# Patient Record
Sex: Female | Born: 1935 | Race: White | Hispanic: No | Marital: Married | Smoking: Former smoker
Health system: Southern US, Community
[De-identification: ages and names within clinical notes are randomized; demographics above are authoritative.]

## PROBLEM LIST (undated history)

## (undated) DIAGNOSIS — Z973 Presence of spectacles and contact lenses: Secondary | ICD-10-CM

## (undated) DIAGNOSIS — M8580 Other specified disorders of bone density and structure, unspecified site: Secondary | ICD-10-CM

## (undated) DIAGNOSIS — D509 Iron deficiency anemia, unspecified: Secondary | ICD-10-CM

## (undated) DIAGNOSIS — C539 Malignant neoplasm of cervix uteri, unspecified: Secondary | ICD-10-CM

## (undated) DIAGNOSIS — R399 Unspecified symptoms and signs involving the genitourinary system: Secondary | ICD-10-CM

## (undated) DIAGNOSIS — Z8781 Personal history of (healed) traumatic fracture: Secondary | ICD-10-CM

## (undated) DIAGNOSIS — Z8551 Personal history of malignant neoplasm of bladder: Secondary | ICD-10-CM

## (undated) DIAGNOSIS — I10 Essential (primary) hypertension: Secondary | ICD-10-CM

## (undated) HISTORY — DX: Essential (primary) hypertension: I10

---

## 2004-07-01 HISTORY — PX: OTHER SURGICAL HISTORY: SHX169

## 2004-12-26 ENCOUNTER — Ambulatory Visit (HOSPITAL_COMMUNITY): Admission: RE | Admit: 2004-12-26 | Discharge: 2004-12-26 | Payer: Self-pay | Admitting: Orthopedic Surgery

## 2014-07-01 HISTORY — PX: COLONOSCOPY: SHX174

## 2014-12-27 ENCOUNTER — Inpatient Hospital Stay (HOSPITAL_COMMUNITY)
Admission: EM | Admit: 2014-12-27 | Discharge: 2014-12-30 | DRG: 470 | Disposition: A | Discharge status: Home Health | Payer: Commercial Managed Care - HMO | Attending: Internal Medicine | Admitting: Internal Medicine

## 2014-12-27 ENCOUNTER — Emergency Department (HOSPITAL_COMMUNITY): Payer: Commercial Managed Care - HMO

## 2014-12-27 ENCOUNTER — Encounter (HOSPITAL_COMMUNITY): Payer: Self-pay

## 2014-12-27 DIAGNOSIS — S72012A Unspecified intracapsular fracture of left femur, initial encounter for closed fracture: Secondary | ICD-10-CM | POA: Diagnosis present

## 2014-12-27 DIAGNOSIS — Z96642 Presence of left artificial hip joint: Secondary | ICD-10-CM | POA: Diagnosis not present

## 2014-12-27 DIAGNOSIS — R030 Elevated blood-pressure reading, without diagnosis of hypertension: Secondary | ICD-10-CM

## 2014-12-27 DIAGNOSIS — S72002S Fracture of unspecified part of neck of left femur, sequela: Secondary | ICD-10-CM | POA: Diagnosis not present

## 2014-12-27 DIAGNOSIS — R531 Weakness: Secondary | ICD-10-CM | POA: Diagnosis not present

## 2014-12-27 DIAGNOSIS — R404 Transient alteration of awareness: Secondary | ICD-10-CM | POA: Diagnosis not present

## 2014-12-27 DIAGNOSIS — Y93K1 Activity, walking an animal: Secondary | ICD-10-CM

## 2014-12-27 DIAGNOSIS — D72829 Elevated white blood cell count, unspecified: Secondary | ICD-10-CM | POA: Insufficient documentation

## 2014-12-27 DIAGNOSIS — S72002A Fracture of unspecified part of neck of left femur, initial encounter for closed fracture: Secondary | ICD-10-CM | POA: Diagnosis not present

## 2014-12-27 DIAGNOSIS — W1839XA Other fall on same level, initial encounter: Secondary | ICD-10-CM | POA: Diagnosis not present

## 2014-12-27 DIAGNOSIS — D62 Acute posthemorrhagic anemia: Secondary | ICD-10-CM | POA: Diagnosis not present

## 2014-12-27 DIAGNOSIS — R52 Pain, unspecified: Secondary | ICD-10-CM | POA: Diagnosis present

## 2014-12-27 DIAGNOSIS — Z471 Aftercare following joint replacement surgery: Secondary | ICD-10-CM | POA: Diagnosis not present

## 2014-12-27 DIAGNOSIS — Z882 Allergy status to sulfonamides status: Secondary | ICD-10-CM | POA: Diagnosis not present

## 2014-12-27 DIAGNOSIS — Z87891 Personal history of nicotine dependence: Secondary | ICD-10-CM | POA: Diagnosis not present

## 2014-12-27 DIAGNOSIS — Z419 Encounter for procedure for purposes other than remedying health state, unspecified: Secondary | ICD-10-CM

## 2014-12-27 DIAGNOSIS — S72002D Fracture of unspecified part of neck of left femur, subsequent encounter for closed fracture with routine healing: Secondary | ICD-10-CM | POA: Diagnosis not present

## 2014-12-27 DIAGNOSIS — E876 Hypokalemia: Secondary | ICD-10-CM | POA: Diagnosis not present

## 2014-12-27 DIAGNOSIS — S72042A Displaced fracture of base of neck of left femur, initial encounter for closed fracture: Secondary | ICD-10-CM | POA: Diagnosis not present

## 2014-12-27 DIAGNOSIS — IMO0001 Reserved for inherently not codable concepts without codable children: Secondary | ICD-10-CM | POA: Insufficient documentation

## 2014-12-27 LAB — CBC WITH DIFFERENTIAL/PLATELET
Basophils Absolute: 0 10*3/uL (ref 0.0–0.1)
Basophils Absolute: 0 10*3/uL (ref 0.0–0.1)
Basophils Relative: 0 % (ref 0–1)
Basophils Relative: 0 % (ref 0–1)
EOS ABS: 0 10*3/uL (ref 0.0–0.7)
Eosinophils Absolute: 0 10*3/uL (ref 0.0–0.7)
Eosinophils Relative: 0 % (ref 0–5)
Eosinophils Relative: 0 % (ref 0–5)
HCT: 43.7 % (ref 36.0–46.0)
HEMATOCRIT: 40.1 % (ref 36.0–46.0)
Hemoglobin: 14.6 g/dL (ref 12.0–15.0)
Hemoglobin: 13.8 g/dL (ref 12.0–15.0)
LYMPHS ABS: 0.8 10*3/uL (ref 0.7–4.0)
LYMPHS PCT: 5 % — AB (ref 12–46)
Lymphocytes Relative: 5 % — ABNORMAL LOW (ref 12–46)
Lymphs Abs: 0.9 10*3/uL (ref 0.7–4.0)
MCH: 29.4 pg (ref 26.0–34.0)
MCH: 30 pg (ref 26.0–34.0)
MCHC: 33.4 g/dL (ref 30.0–36.0)
MCHC: 34.4 g/dL (ref 30.0–36.0)
MCV: 87.9 fL (ref 78.0–100.0)
MCV: 87.2 fL (ref 78.0–100.0)
MONOS PCT: 4 % (ref 3–12)
Monocytes Absolute: 0.7 10*3/uL (ref 0.1–1.0)
Monocytes Absolute: 0.7 10*3/uL (ref 0.1–1.0)
Monocytes Relative: 4 % (ref 3–12)
Neutro Abs: 15.6 10*3/uL — ABNORMAL HIGH (ref 1.7–7.7)
Neutro Abs: 15.2 10*3/uL — ABNORMAL HIGH (ref 1.7–7.7)
Neutrophils Relative %: 91 % — ABNORMAL HIGH (ref 43–77)
Neutrophils Relative %: 91 % — ABNORMAL HIGH (ref 43–77)
Platelets: 259 10*3/uL (ref 150–400)
Platelets: 243 10*3/uL (ref 150–400)
RBC: 4.97 MIL/uL (ref 3.87–5.11)
RBC: 4.6 MIL/uL (ref 3.87–5.11)
RDW: 13.1 % (ref 11.5–15.5)
RDW: 12.8 % (ref 11.5–15.5)
WBC: 17.2 10*3/uL — ABNORMAL HIGH (ref 4.0–10.5)
WBC: 16.6 10*3/uL — AB (ref 4.0–10.5)

## 2014-12-27 LAB — TYPE AND SCREEN
ABO/RH(D): A NEG
Antibody Screen: NEGATIVE

## 2014-12-27 LAB — BASIC METABOLIC PANEL
ANION GAP: 10 (ref 5–15)
BUN: 19 mg/dL (ref 6–20)
CO2: 26 mmol/L (ref 22–32)
Calcium: 9.3 mg/dL (ref 8.9–10.3)
Chloride: 103 mmol/L (ref 101–111)
Creatinine, Ser: 0.74 mg/dL (ref 0.44–1.00)
GFR calc Af Amer: >60 mL/min (ref >60)
GLUCOSE: 115 mg/dL — AB (ref 65–99)
POTASSIUM: 3.9 mmol/L (ref 3.5–5.1)
Sodium: 139 mmol/L (ref 135–145)

## 2014-12-27 LAB — PROTIME-INR
INR: 0.99 (ref 0.00–1.49)
PROTHROMBIN TIME: 13.3 s (ref 11.6–15.2)

## 2014-12-27 LAB — ABO/RH: ABO/RH(D): A NEG

## 2014-12-27 MED ORDER — MORPHINE SULFATE 2 MG/ML IJ SOLN
2.0000 mg | Freq: Once | INTRAMUSCULAR | Status: AC
  Administered 2014-12-27: 2 mg via INTRAVENOUS
  Filled 2014-12-27: qty 1

## 2014-12-27 NOTE — Consult Note (Signed)
Reason for Consult:  Left hip fracture Referring Physician:  EDP  Sherri Mendez is an 79 y.o. female.  HPI:   79 yo female who sustained and accidental mechanical fall when pulled down by the neighbors dog who she was trying to walk.  She felt severe left hip pain following this fall (10 out of 10 pain) and was unable to ambulate.  She was brought to [facility] ED via EMS and found to have a left hip fracture.  She reports only left hip pain and denies other injuries.  She also denies any syncopal episode and denies CP/SOB/F/C/N/V.  History reviewed. No pertinent past medical history.  History reviewed. No pertinent past surgical history.  History reviewed. No pertinent family history.  Social History:  reports that she has quit smoking. She does not have any smokeless tobacco history on file. Her alcohol and drug histories are not on file.  Allergies:  Allergies  Allergen Reactions  . Sulfa Antibiotics Hives    Medications: I have reviewed the patient's current medications.  Results for orders placed or performed during the hospital encounter of 12/27/14 (from the past 48 hour(s))  CBC WITH DIFFERENTIAL     Status: Abnormal   Collection Time: 12/27/14  8:41 PM  Result Value Ref Range   WBC 17.2 (H) 4.0 - 10.5 K/uL   RBC 4.97 3.87 - 5.11 MIL/uL   Hemoglobin 14.6 12.0 - 15.0 g/dL   HCT 43.7 36.0 - 46.0 %   MCV 87.9 78.0 - 100.0 fL   MCH 29.4 26.0 - 34.0 pg   MCHC 33.4 30.0 - 36.0 g/dL   RDW 13.1 11.5 - 15.5 %   Platelets 259 150 - 400 K/uL   Neutrophils Relative % 91 (H) 43 - 77 %   Neutro Abs 15.6 (H) 1.7 - 7.7 K/uL   Lymphocytes Relative 5 (L) 12 - 46 %   Lymphs Abs 0.9 0.7 - 4.0 K/uL   Monocytes Relative 4 3 - 12 %   Monocytes Absolute 0.7 0.1 - 1.0 K/uL   Eosinophils Relative 0 0 - 5 %   Eosinophils Absolute 0.0 0.0 - 0.7 K/uL   Basophils Relative 0 0 - 1 %   Basophils Absolute 0.0 0.0 - 0.1 K/uL    Dg Hip Unilat With Pelvis 2-3 Views Left  12/27/2014   CLINICAL DATA:   Left femur pain after fall.  EXAM: LEFT HIP (WITH PELVIS) 2-3 VIEWS  COMPARISON:  None.  FINDINGS: Malalignment of the femoral neck and head is highly suspicious for a subcapital femoral neck fracture.  Degenerative arthropathy of both hips, right greater than left.  Scattered vascular calcifications in the anatomic pelvis.  IMPRESSION: 1. Subcapital left femoral neck fracture 2. Degenerative arthropathy of both hips, right greater than left.   Electronically Signed   By: Van Clines M.D.   On: 12/27/2014 19:58   Dg Femur Min 2 Views Left  12/27/2014   CLINICAL DATA:  Pain following fall wall walking dog  EXAM: LEFT FEMUR 2 VIEWS  COMPARISON:  None.  FINDINGS: Frontal and lateral views were obtained. There is an impacted subcapital femoral neck fracture. No other fracture. No dislocation. There is moderate narrowing in the hip and knee joint regions. No knee joint effusion.  IMPRESSION: Evidence of an impacted subcapital femoral neck fracture. No dislocation. Osteoarthritic change in the knee and hip joints.   Electronically Signed   By: Lowella Grip III M.D.   On: 12/27/2014 19:59  Review of Systems  All other systems reviewed and are negative.  Blood pressure 197/88, pulse 97, temperature 98.5 F (36.9 C), temperature source Oral, resp. rate 20, height '5\' 5"'$  (1.651 m), weight 70.308 kg (155 lb), SpO2 94 %. Physical Exam  Constitutional: She is oriented to person, place, and time. She appears well-developed and well-nourished.  HENT:  Head: Normocephalic and atraumatic.  Eyes: EOM are normal. Pupils are equal, round, and reactive to light.  Neck: Normal range of motion. Neck supple.  Cardiovascular: Normal rate and regular rhythm.   Respiratory: Effort normal and breath sounds normal.  GI: Soft. Bowel sounds are normal.  Musculoskeletal:       Left hip: She exhibits decreased range of motion, decreased strength, tenderness and bony tenderness.  Neurological: She is alert and  oriented to person, place, and time.  Skin: Skin is warm and dry.  Psychiatric: She has a normal mood and affect.   She has severe pain with any attempts of motion of her left hip. Her left leg is only slightly shortened. Her right foot is well-perfused with normal motor and sensory function   Assessment/Plan: Left hip femoral neck fracture 1)  I spoke to Ms. Simones in length and great detail about her left hip injury.  My recommendation would be a left total hip arthroplasty thru an anterior approach given the impaction and displacement of this fracture.  She is a very mobile person and quite active with no significant medical issues.  She will be able to mobilize much more quickly with a hip replacement.  She understands fully the risks of acute blood loss, nerve and vessel injury, infection, DVT, and fracture.  She also recognizes that the goals of surgery are decreased pain, improved mobility, and improved quality of life.  She will be set up for surgery tomorrow early afternoon at [facility] due to OR availability.  This surgery will more likely be performed by my partner Dr. Eduard Roux, and she understands this fully as well.  She can eat now, and should be NPO after midnight tonight.  Will likely only use aspirin as DVT proph. Following surgery.  Mcarthur Rossetti 12/27/2014, 9:09 PM

## 2014-12-27 NOTE — ED Provider Notes (Signed)
CSN: [id number]     Arrival date & time 12/27/14  1752 History   First MD Initiated Contact with Patient 12/27/14 1757     Chief Complaint  Patient presents with  . Fall    Fall approx 32mn ago while walking dog - c/o left leg pain.   HPI  89year old female presents today status post fall. Patient reports she was walking the dog doctor cough causing her to fall forward and landing on her left side. She reports minor abrasions to right elbow no pain to the elbow. She reports left lateral leg pain. She reports at that time she was unable to ambulate, had to call 911 to bring her to the hospital. Further questioning patient reports she was able to stand but had significant difficulties with ambulation. She denies loss of distal sensation, perfusion of the extremity. Patient reports difficulty with hip flexion due to pain in the leg. She reports she is not currently on any antiplatelets or blood thinners. Is not tried anything for the pain. Denies any other injuries.   History reviewed. No pertinent past medical history. History reviewed. No pertinent past surgical history. History reviewed. No pertinent family history. History  Substance Use Topics  . Smoking status: Former SResearch scientist (life sciences) . Smokeless tobacco: Not on file  . Alcohol Use: Not on file   OB History    No data available     Review of Systems  All other systems reviewed and are negative.   Allergies  Sulfa antibiotics  Home Medications   Prior to Admission medications   Medication Sig Start Date End Date Taking? Authorizing Provider  Multiple Vitamins-Minerals (MULTIVITAMIN WITH MINERALS) tablet Take 1 tablet by mouth daily.   Yes Historical Provider, MD   BP 152/59 mmHg  Pulse 76  Temp(Src) 98.8 F (37.1 C) (Oral)  Resp 18  Ht '5\' 5"'$  (1.651 m)  Wt 155 lb (70.308 kg)  BMI 25.79 kg/m2  SpO2 93%    Physical Exam  Constitutional: She is oriented to person, place, and time. She appears well-developed and  well-nourished.  HENT:  Head: Normocephalic and atraumatic.  Eyes: Conjunctivae are normal. Pupils are equal, round, and reactive to light. Right eye exhibits no discharge. Left eye exhibits no discharge. No scleral icterus.  Neck: Normal range of motion. No JVD present. No tracheal deviation present.  Pulmonary/Chest: Effort normal. No stridor.  Musculoskeletal:  Abrasions to the left elbow, nontender to palpation, full flexion and extension pronation supination. Strength 5 out of 5 and sensation grossly intact in the extremity.. Left lower leg tender to palpation in the left lateral aspect. Hips stable, no pain with palpation or movement. Knee nontender to palpation, no signs of distal injuries. Sensation perfusion intact. Right leg raise difficult due to pain of the left thigh.  Neurological: She is alert and oriented to person, place, and time. Coordination normal.  Psychiatric: She has a normal mood and affect. Her behavior is normal. Judgment and thought content normal.  Nursing note and vitals reviewed.   ED Course  Procedures (including critical care time) Labs Review Labs Reviewed  CBC WITH DIFFERENTIAL/PLATELET - Abnormal; Notable for the following:    WBC 17.2 (*)    Neutrophils Relative % 91 (*)    Neutro Abs 15.6 (*)    Lymphocytes Relative 5 (*)    All other components within normal limits  BASIC METABOLIC PANEL - Abnormal; Notable for the following:    Glucose, Bld 115 (*)    All other  components within normal limits  CBC WITH DIFFERENTIAL/PLATELET - Abnormal; Notable for the following:    WBC 16.6 (*)    Neutrophils Relative % 91 (*)    Neutro Abs 15.2 (*)    Lymphocytes Relative 5 (*)    All other components within normal limits  PROTIME-INR  TYPE AND SCREEN  ABO/RH    Imaging Review Dg Hip Unilat With Pelvis 2-3 Views Left  12/27/2014   CLINICAL DATA:  Left femur pain after fall.  EXAM: LEFT HIP (WITH PELVIS) 2-3 VIEWS  COMPARISON:  None.  FINDINGS:  Malalignment of the femoral neck and head is highly suspicious for a subcapital femoral neck fracture.  Degenerative arthropathy of both hips, right greater than left.  Scattered vascular calcifications in the anatomic pelvis.  IMPRESSION: 1. Subcapital left femoral neck fracture 2. Degenerative arthropathy of both hips, right greater than left.   Electronically Signed   By: Van Clines M.D.   On: 12/27/2014 19:58   Dg Femur Min 2 Views Left  12/27/2014   CLINICAL DATA:  Pain following fall wall walking dog  EXAM: LEFT FEMUR 2 VIEWS  COMPARISON:  None.  FINDINGS: Frontal and lateral views were obtained. There is an impacted subcapital femoral neck fracture. No other fracture. No dislocation. There is moderate narrowing in the hip and knee joint regions. No knee joint effusion.  IMPRESSION: Evidence of an impacted subcapital femoral neck fracture. No dislocation. Osteoarthritic change in the knee and hip joints.   Electronically Signed   By: Lowella Grip III M.D.   On: 12/27/2014 19:59     EKG Interpretation None      MDM   Final diagnoses:  Pain  Femoral neck fracture, left, closed, initial encounter   Labs: BMP, CBC, PT/INR, type and screen  Imaging: DG femur, DG hip unilateral- subcapital left femoral neck fracture  Consults: Orthopedics Dr. Ninfa Linden, hospitalist  Therapeutics: Morphine   Plan: Patient was personally seen and evaluated by Dr. Ninfa Linden urine the ED. He recommended admission at [facility] with surgery tomorrow. Basic labs are drawn, no significant findings, nothing by mouth at midnight. Hospitalist consult at and agreed to the plan. Patient was treated with morphine here in the ED with good symptom resolution.      Okey Regal, PA-C 12/28/14 [phone]

## 2014-12-27 NOTE — Progress Notes (Signed)
EDCM spoke to patient at bedside.  Patient listed as not having insurance or a pcp, however when [facility] spoke to patient, patient reports she has [employer].  Patient also reports she has a new pcp, can't remember her name at this time.  Patient lives at home alone.  Patient is usually able to complete her ADL's without difficulty.  Patient prepares her own meals or "I eat out."  Patient has a walker at home.  No further dme at home.  Patient has never had home health services.  Patient reports she is a widower and has a daughter Geanie Berlin who lives in [place].  No other family members.  Patient has a neighbor at bedside.  EDCM provided patient with list of home health agencies in [place] explained services.  Patient presents to Ed with fall sustaining subcapital left femoral neck fracture.  Patient may need rehab placement.

## 2014-12-27 NOTE — ED Notes (Signed)
Pt transported by [facility] for fall.  Pt reports she was walking her dog and he pulled her down when running.  Pt landed on left side and has c/o left leg pain.  No deformity - patient unable to bear weight.  Denies other injuries.

## 2014-12-27 NOTE — ED Provider Notes (Signed)
Medical screening examination/treatment/procedure(s) were conducted as a shared visit with non-physician practitioner(s) and myself.  I personally evaluated the patient during the encounter.   EKG Interpretation None     Patient here after mechanical fall prior to arrival and complains of pain to her left hip. Physical exam shows a shortened and rotated left lower extremity and x-ray shows a fracture. Will consult orthopedics and admitted to medicine  Lacretia Leigh, MD 12/27/14 2011

## 2014-12-27 NOTE — Clinical Social Work Note (Signed)
Clinical Social Work Assessment  Patient Details  Name: Sherri Mendez MRN: [id number] Date of Birth: 04-24-1936  Date of referral:  12/27/14               Reason for consult:   (Fall.)                Permission sought to share information with:   (None.) Permission granted to share information::  No  Name::        Agency::     Relationship::     Contact Information:     Housing/Transportation Living arrangements for the past 2 months:  Single Family Home Source of Information:  Patient Patient Interpreter Needed:  None Criminal Activity/Legal Involvement Pertinent to Current Situation/Hospitalization:  No - Comment as needed Significant Relationships:  Adult Children (Patient informed CSW that her daughter [name] who lives in [place] is a great support .) Lives with:  Self Do you feel safe going back to the place where you live?  Yes ([place] was informed by physician that she broke her hip. Patient states if rehab is needed she would like to be placed at facility. ) Need for family participation in patient care:  Yes (Comment)  Care giving concerns:  Patient informed CSW that she lives home alone. Patient expressed to CSW that if rehab is needed she would like to be placed at a facility.   Social Worker assessment / plan:  CSW met with patient at bedside. Neighbor was present. Patient confirms that she presents to [facility] due to a fall. Patient confirms that she fell while walking a dog. Patient stated " She took after something and just pulled me along with her and I fell." Patient states that she does not fall often. Patient says that prior to coming to [facility] she has been able to complete her ADL's independently.  Patient informed CSW that her daughter is her primary support and that she lives in [place]. Patient also says that she has a great support system that consist of her neighbors.  Employment status:  Retired Forensic scientist:   Actor.) PT  Recommendations:  Not assessed at this time Information / Referral to community resources:   (CSW will give patient a list of SNF.)  Patient/Family's Response to care:  Patient's response to care is appropriate at this time. She is aware that she will be admitted.  Patient/Family's Understanding of and Emotional Response to Diagnosis, Current Treatment, and Prognosis:  Patient is understanding of her diagnosis at this time.  Emotional Assessment Appearance:  Appears stated age Attitude/Demeanor/Rapport:   (Well Mannered.) Affect (typically observed):  Accepting, Appropriate Orientation:  Oriented to Self, Oriented to Place, Oriented to  Time, Oriented to Situation Alcohol / Substance use:   (Patient informed CSW that she drinks wine daily.) Psych involvement (Current and /or in the community):  No (Comment)  Discharge Needs  Concerns to be addressed:  Adjustment to Illness Readmission within the last 30 days:  No Current discharge risk:  None Barriers to Discharge:  No Barriers Identified   Bernita Buffy, LCSW 12/27/2014, 10:34 PM

## 2014-12-27 NOTE — ED Notes (Signed)
Admitting physician with pt at this time

## 2014-12-27 NOTE — ED Notes (Signed)
Bed: WA03 Expected date:  Expected time:  Means of arrival:  Comments: EMS_fall 

## 2014-12-27 NOTE — ED Notes (Signed)
Patient transported to X-ray 

## 2014-12-28 ENCOUNTER — Inpatient Hospital Stay (HOSPITAL_COMMUNITY): Payer: Commercial Managed Care - HMO | Admitting: Anesthesiology

## 2014-12-28 ENCOUNTER — Inpatient Hospital Stay (HOSPITAL_COMMUNITY): Payer: Commercial Managed Care - HMO

## 2014-12-28 ENCOUNTER — Encounter (HOSPITAL_COMMUNITY)
Admission: EM | Disposition: A | Discharge status: Home Health | Payer: Self-pay | Source: Home / Self Care | Attending: Internal Medicine

## 2014-12-28 ENCOUNTER — Encounter (HOSPITAL_COMMUNITY): Payer: Self-pay | Admitting: Anesthesiology

## 2014-12-28 DIAGNOSIS — D62 Acute posthemorrhagic anemia: Secondary | ICD-10-CM | POA: Diagnosis not present

## 2014-12-28 DIAGNOSIS — D72829 Elevated white blood cell count, unspecified: Secondary | ICD-10-CM | POA: Insufficient documentation

## 2014-12-28 DIAGNOSIS — R030 Elevated blood-pressure reading, without diagnosis of hypertension: Secondary | ICD-10-CM

## 2014-12-28 DIAGNOSIS — Z882 Allergy status to sulfonamides status: Secondary | ICD-10-CM | POA: Diagnosis not present

## 2014-12-28 DIAGNOSIS — W1839XA Other fall on same level, initial encounter: Secondary | ICD-10-CM | POA: Diagnosis not present

## 2014-12-28 DIAGNOSIS — S72002S Fracture of unspecified part of neck of left femur, sequela: Secondary | ICD-10-CM | POA: Diagnosis not present

## 2014-12-28 DIAGNOSIS — Y93K1 Activity, walking an animal: Secondary | ICD-10-CM | POA: Diagnosis not present

## 2014-12-28 DIAGNOSIS — S72012A Unspecified intracapsular fracture of left femur, initial encounter for closed fracture: Secondary | ICD-10-CM | POA: Diagnosis present

## 2014-12-28 DIAGNOSIS — R52 Pain, unspecified: Secondary | ICD-10-CM | POA: Diagnosis present

## 2014-12-28 DIAGNOSIS — S72002D Fracture of unspecified part of neck of left femur, subsequent encounter for closed fracture with routine healing: Secondary | ICD-10-CM | POA: Diagnosis not present

## 2014-12-28 DIAGNOSIS — E876 Hypokalemia: Secondary | ICD-10-CM | POA: Diagnosis not present

## 2014-12-28 DIAGNOSIS — IMO0001 Reserved for inherently not codable concepts without codable children: Secondary | ICD-10-CM | POA: Insufficient documentation

## 2014-12-28 DIAGNOSIS — S72002A Fracture of unspecified part of neck of left femur, initial encounter for closed fracture: Secondary | ICD-10-CM | POA: Diagnosis not present

## 2014-12-28 DIAGNOSIS — Z87891 Personal history of nicotine dependence: Secondary | ICD-10-CM | POA: Diagnosis not present

## 2014-12-28 HISTORY — PX: TOTAL HIP ARTHROPLASTY: SHX124

## 2014-12-28 LAB — CREATININE, SERUM
Creatinine, Ser: 0.76 mg/dL (ref 0.44–1.00)
GFR calc Af Amer: >60 mL/min (ref >60)
GFR calc non Af Amer: >60 mL/min (ref >60)

## 2014-12-28 LAB — CBC
HEMATOCRIT: 34 % — AB (ref 36.0–46.0)
Hemoglobin: 11.5 g/dL — ABNORMAL LOW (ref 12.0–15.0)
MCH: 29.9 pg (ref 26.0–34.0)
MCHC: 33.8 g/dL (ref 30.0–36.0)
MCV: 88.3 fL (ref 78.0–100.0)
Platelets: 223 10*3/uL (ref 150–400)
RBC: 3.85 MIL/uL — AB (ref 3.87–5.11)
RDW: 13.2 % (ref 11.5–15.5)
WBC: 15.4 10*3/uL — ABNORMAL HIGH (ref 4.0–10.5)

## 2014-12-28 LAB — SURGICAL PCR SCREEN
MRSA, PCR: NEGATIVE
STAPHYLOCOCCUS AUREUS: NEGATIVE

## 2014-12-28 SURGERY — ARTHROPLASTY, HIP, TOTAL, ANTERIOR APPROACH
Anesthesia: Monitor Anesthesia Care | Laterality: Left

## 2014-12-28 MED ORDER — POVIDONE-IODINE 10 % EX SOLN
CUTANEOUS | Status: DC | PRN
  Administered 2014-12-28: 1 via TOPICAL

## 2014-12-28 MED ORDER — HYDROCODONE-ACETAMINOPHEN 5-325 MG PO TABS
1.0000 | ORAL_TABLET | Freq: Four times a day (QID) | ORAL | Status: DC | PRN
  Administered 2014-12-28 – 2014-12-29 (×2): 1 via ORAL
  Filled 2014-12-28 (×2): qty 1

## 2014-12-28 MED ORDER — PHENYLEPHRINE HCL 10 MG/ML IJ SOLN
10.0000 mg | INTRAVENOUS | Status: DC | PRN
  Administered 2014-12-28: 40 ug/min via INTRAVENOUS

## 2014-12-28 MED ORDER — ONDANSETRON HCL 4 MG/2ML IJ SOLN
4.0000 mg | Freq: Four times a day (QID) | INTRAMUSCULAR | Status: DC | PRN
  Administered 2014-12-28: 4 mg via INTRAVENOUS

## 2014-12-28 MED ORDER — ASPIRIN EC 325 MG PO TBEC: 325.0000 mg | DELAYED_RELEASE_TABLET | Freq: Every day | ORAL | Status: DC

## 2014-12-28 MED ORDER — ACETAMINOPHEN 325 MG PO TABS
ORAL_TABLET | ORAL | Status: AC
  Filled 2014-12-28: qty 2

## 2014-12-28 MED ORDER — SODIUM CHLORIDE 0.9 % IV SOLN
INTRAVENOUS | Status: DC
  Administered 2014-12-28: 19:00:00 via INTRAVENOUS

## 2014-12-28 MED ORDER — MIDAZOLAM HCL 2 MG/2ML IJ SOLN
INTRAMUSCULAR | Status: AC
  Filled 2014-12-28: qty 2

## 2014-12-28 MED ORDER — ONDANSETRON HCL 4 MG PO TABS: 4.0000 mg | ORAL_TABLET | Freq: Four times a day (QID) | ORAL | Status: DC | PRN

## 2014-12-28 MED ORDER — ONDANSETRON HCL 4 MG/2ML IJ SOLN
INTRAMUSCULAR | Status: AC
  Filled 2014-12-28: qty 2

## 2014-12-28 MED ORDER — ENOXAPARIN SODIUM 40 MG/0.4ML ~~LOC~~ SOLN: 40.0000 mg | Freq: Every day | SUBCUTANEOUS | Status: DC

## 2014-12-28 MED ORDER — ACETAMINOPHEN 325 MG PO TABS
650.0000 mg | ORAL_TABLET | Freq: Four times a day (QID) | ORAL | Status: DC | PRN
  Administered 2014-12-28: 650 mg via ORAL

## 2014-12-28 MED ORDER — FENTANYL CITRATE (PF) 100 MCG/2ML IJ SOLN
INTRAMUSCULAR | Status: DC | PRN
  Administered 2014-12-28: 25 ug via INTRAVENOUS
  Administered 2014-12-28: 50 ug via INTRAVENOUS
  Administered 2014-12-28: 25 ug via INTRAVENOUS

## 2014-12-28 MED ORDER — OXYCODONE HCL 5 MG PO TABS
ORAL_TABLET | ORAL | Status: AC
  Filled 2014-12-28: qty 2

## 2014-12-28 MED ORDER — MORPHINE SULFATE 2 MG/ML IJ SOLN
0.5000 mg | INTRAMUSCULAR | Status: DC | PRN
  Administered 2014-12-28 – 2014-12-29 (×3): 0.5 mg via INTRAVENOUS
  Filled 2014-12-28 (×5): qty 1

## 2014-12-28 MED ORDER — OXYCODONE HCL 5 MG PO TABS: 5.0000 mg | ORAL_TABLET | ORAL | Status: DC | PRN

## 2014-12-28 MED ORDER — ENOXAPARIN SODIUM 40 MG/0.4ML ~~LOC~~ SOLN
40.0000 mg | SUBCUTANEOUS | Status: DC
  Administered 2014-12-29 – 2014-12-30 (×2): 40 mg via SUBCUTANEOUS
  Filled 2014-12-28 (×2): qty 0.4

## 2014-12-28 MED ORDER — LACTATED RINGERS IV SOLN
INTRAVENOUS | Status: DC
  Administered 2014-12-28: 50 mL/h via INTRAVENOUS
  Administered 2014-12-28: 15:00:00 via INTRAVENOUS

## 2014-12-28 MED ORDER — ALUM & MAG HYDROXIDE-SIMETH 200-200-20 MG/5ML PO SUSP: 30.0000 mL | ORAL | Status: DC | PRN

## 2014-12-28 MED ORDER — PROPOFOL 10 MG/ML IV BOLUS
INTRAVENOUS | Status: DC | PRN
  Administered 2014-12-28: 30 mg via INTRAVENOUS
  Administered 2014-12-28: 20 mg via INTRAVENOUS

## 2014-12-28 MED ORDER — MIDAZOLAM HCL 5 MG/5ML IJ SOLN
INTRAMUSCULAR | Status: DC | PRN
  Administered 2014-12-28 (×2): 0.5 mg via INTRAVENOUS

## 2014-12-28 MED ORDER — OXYCODONE HCL 5 MG PO TABS
5.0000 mg | ORAL_TABLET | ORAL | Status: DC | PRN
  Administered 2014-12-28: 10 mg via ORAL
  Administered 2014-12-29 – 2014-12-30 (×3): 5 mg via ORAL
  Filled 2014-12-28: qty 2
  Filled 2014-12-28 (×2): qty 1

## 2014-12-28 MED ORDER — PROPOFOL INFUSION 10 MG/ML OPTIME
INTRAVENOUS | Status: DC | PRN
  Administered 2014-12-28: 25 ug/kg/min via INTRAVENOUS

## 2014-12-28 MED ORDER — METHOCARBAMOL 1000 MG/10ML IJ SOLN
500.0000 mg | Freq: Four times a day (QID) | INTRAVENOUS | Status: DC | PRN
  Filled 2014-12-28: qty 5

## 2014-12-28 MED ORDER — MORPHINE SULFATE 2 MG/ML IJ SOLN: 0.5000 mg | INTRAMUSCULAR | Status: DC | PRN

## 2014-12-28 MED ORDER — POLYETHYLENE GLYCOL 3350 17 G PO PACK: 17.0000 g | PACK | Freq: Every day | ORAL | Status: DC | PRN

## 2014-12-28 MED ORDER — 0.9 % SODIUM CHLORIDE (POUR BTL) OPTIME
TOPICAL | Status: DC | PRN
  Administered 2014-12-28: 1000 mL

## 2014-12-28 MED ORDER — BUPIVACAINE IN DEXTROSE 0.75-8.25 % IT SOLN
INTRATHECAL | Status: DC | PRN
  Administered 2014-12-28: 15 mg via INTRATHECAL

## 2014-12-28 MED ORDER — ACETAMINOPHEN 650 MG RE SUPP: 650.0000 mg | Freq: Four times a day (QID) | RECTAL | Status: DC | PRN

## 2014-12-28 MED ORDER — METOCLOPRAMIDE HCL 5 MG/ML IJ SOLN: 5.0000 mg | Freq: Three times a day (TID) | INTRAMUSCULAR | Status: DC | PRN

## 2014-12-28 MED ORDER — MENTHOL 3 MG MT LOZG: 1.0000 | LOZENGE | OROMUCOSAL | Status: DC | PRN

## 2014-12-28 MED ORDER — METHOCARBAMOL 500 MG PO TABS
ORAL_TABLET | ORAL | Status: AC
  Filled 2014-12-28: qty 1

## 2014-12-28 MED ORDER — CEFAZOLIN SODIUM-DEXTROSE 2-3 GM-% IV SOLR
INTRAVENOUS | Status: DC | PRN
  Administered 2014-12-28: 2 g via INTRAVENOUS

## 2014-12-28 MED ORDER — HYDROCODONE-ACETAMINOPHEN 5-325 MG PO TABS: 1.0000 | ORAL_TABLET | Freq: Four times a day (QID) | ORAL | Status: DC | PRN

## 2014-12-28 MED ORDER — CEFAZOLIN SODIUM-DEXTROSE 2-3 GM-% IV SOLR
2.0000 g | Freq: Four times a day (QID) | INTRAVENOUS | Status: AC
  Administered 2014-12-28 – 2014-12-29 (×3): 2 g via INTRAVENOUS
  Filled 2014-12-28 (×3): qty 50

## 2014-12-28 MED ORDER — PHENOL 1.4 % MT LIQD: 1.0000 | OROMUCOSAL | Status: DC | PRN

## 2014-12-28 MED ORDER — FENTANYL CITRATE (PF) 250 MCG/5ML IJ SOLN
INTRAMUSCULAR | Status: AC
  Filled 2014-12-28: qty 5

## 2014-12-28 MED ORDER — METHOCARBAMOL 500 MG PO TABS
500.0000 mg | ORAL_TABLET | Freq: Four times a day (QID) | ORAL | Status: DC | PRN
  Administered 2014-12-28 – 2014-12-30 (×2): 500 mg via ORAL
  Filled 2014-12-28: qty 1

## 2014-12-28 MED ORDER — METOCLOPRAMIDE HCL 5 MG PO TABS: 5.0000 mg | ORAL_TABLET | Freq: Three times a day (TID) | ORAL | Status: DC | PRN

## 2014-12-28 MED ORDER — SODIUM CHLORIDE 0.9 % IR SOLN
Status: DC | PRN
  Administered 2014-12-28: 3000 mL
  Administered 2014-12-28: 1000 mL

## 2014-12-28 SURGICAL SUPPLY — 53 items
BLADE SAW SGTL 18X1.27X75 (BLADE) ×2 IMPLANT
BLADE SAW SGTL 18X1.27X75MM (BLADE) ×1
BNDG COHESIVE 6X5 TAN STRL LF (GAUZE/BANDAGES/DRESSINGS) ×3 IMPLANT
CAPT HIP TOTAL 2 ×3 IMPLANT
CELLS DAT CNTRL 66122 CELL SVR (MISCELLANEOUS) ×1 IMPLANT
COVER SURGICAL LIGHT HANDLE (MISCELLANEOUS) ×3 IMPLANT
DRAPE C-ARM 42X72 X-RAY (DRAPES) ×3 IMPLANT
DRAPE IMP U-DRAPE 54X76 (DRAPES) ×3 IMPLANT
DRAPE STERI IOBAN 125X83 (DRAPES) ×3 IMPLANT
DRAPE U-SHAPE 47X51 STRL (DRAPES) ×9 IMPLANT
DRSG AQUACEL AG ADV 3.5X10 (GAUZE/BANDAGES/DRESSINGS) ×3 IMPLANT
DRSG MEPILEX BORDER 4X8 (GAUZE/BANDAGES/DRESSINGS) IMPLANT
DURAPREP 26ML APPLICATOR (WOUND CARE) ×3 IMPLANT
ELECT BLADE 4.0 EZ CLEAN MEGAD (MISCELLANEOUS) ×3
ELECT REM PT RETURN 9FT ADLT (ELECTROSURGICAL) ×3
ELECTRODE BLDE 4.0 EZ CLN MEGD (MISCELLANEOUS) ×1 IMPLANT
ELECTRODE REM PT RTRN 9FT ADLT (ELECTROSURGICAL) ×1 IMPLANT
FACESHIELD WRAPAROUND (MASK) ×3 IMPLANT
GLOVE BIOGEL PI IND STRL 6.5 (GLOVE) ×2 IMPLANT
GLOVE BIOGEL PI INDICATOR 6.5 (GLOVE) ×4
GLOVE ECLIPSE 6.5 STRL STRAW (GLOVE) ×3 IMPLANT
GLOVE NEODERM STRL 7.5 LF PF (GLOVE) ×2 IMPLANT
GLOVE SURG NEODERM 7.5  LF PF (GLOVE) ×4
GLOVE SURG SS PI 6.5 STRL IVOR (GLOVE) ×3 IMPLANT
GLOVE SURG SYN 7.5  E (GLOVE) ×2
GLOVE SURG SYN 7.5 E (GLOVE) ×1 IMPLANT
GOWN SRG XL XLNG 56XLVL 4 (GOWN DISPOSABLE) ×1 IMPLANT
GOWN STRL NON-REIN XL XLG LVL4 (GOWN DISPOSABLE) ×2
GOWN STRL REUS W/ TWL LRG LVL3 (GOWN DISPOSABLE) ×2 IMPLANT
GOWN STRL REUS W/TWL LRG LVL3 (GOWN DISPOSABLE) ×4
HANDPIECE INTERPULSE COAX TIP (DISPOSABLE) ×2
KIT BASIN OR (CUSTOM PROCEDURE TRAY) ×3 IMPLANT
MARKER SKIN DUAL TIP RULER LAB (MISCELLANEOUS) ×3 IMPLANT
PACK TOTAL JOINT (CUSTOM PROCEDURE TRAY) ×3 IMPLANT
PACK UNIVERSAL I (CUSTOM PROCEDURE TRAY) ×3 IMPLANT
PADDING CAST COTTON 6X4 STRL (CAST SUPPLIES) ×3 IMPLANT
RTRCTR WOUND ALEXIS 18CM MED (MISCELLANEOUS) ×3
SEALER BIPOLAR AQUA 6.0 (INSTRUMENTS) ×6 IMPLANT
SET HNDPC FAN SPRY TIP SCT (DISPOSABLE) ×1 IMPLANT
SOLUTION BETADINE 4OZ (MISCELLANEOUS) ×3 IMPLANT
STAPLER VISISTAT 35W (STAPLE) ×3 IMPLANT
SUT ETHIBOND 2 V 37 (SUTURE) ×3 IMPLANT
SUT ETHIBOND NAB CT1 #1 30IN (SUTURE) ×9 IMPLANT
SUT ETHILON 3 0 FSL (SUTURE) IMPLANT
SUT VIC AB 0 CT1 27 (SUTURE) ×2
SUT VIC AB 0 CT1 27XBRD ANBCTR (SUTURE) ×1 IMPLANT
SUT VIC AB 1 CT1 27 (SUTURE) ×2
SUT VIC AB 1 CT1 27XBRD ANBCTR (SUTURE) ×1 IMPLANT
SUT VIC AB 2-0 CT1 27 (SUTURE) ×4
SUT VIC AB 2-0 CT1 TAPERPNT 27 (SUTURE) ×2 IMPLANT
SYR 20CC LL (SYRINGE) ×3 IMPLANT
TOWEL OR 17X26 10 PK STRL BLUE (TOWEL DISPOSABLE) ×3 IMPLANT
TRAY FOLEY CATH 16FR SILVER (SET/KITS/TRAYS/PACK) IMPLANT

## 2014-12-28 NOTE — Transfer of Care (Signed)
Immediate Anesthesia Transfer of Care Note  Patient: Sherri Mendez  Procedure(s) Performed: Procedure(s): TOTAL HIP ARTHROPLASTY ANTERIOR APPROACH (Left)  Patient Location: PACU  Anesthesia Type:MAC and Spinal  Level of Consciousness: awake and oriented  Airway & Oxygen Therapy: Patient Spontanous Breathing and Patient connected to nasal cannula oxygen  Post-op Assessment: Report given to RN and Post -op Vital signs reviewed and stable  Post vital signs: Reviewed and stable  Last Vitals:  Filed Vitals:   12/28/14 1024  BP: 175/67  Pulse: 83  Temp: 37 C  Resp: 16    Complications: No apparent anesthesia complications

## 2014-12-28 NOTE — H&P (Signed)
[facility] History and Physical  EVEA SHEEK FGH:[id number] DOB: 1935-10-06 DOA: 12/27/2014  Referring physician: EDP PCP: No primary care provider on file.   Chief Complaint: Fall, hip pain   HPI: Sherri Mendez is a 79 y.o. female who suffered a mechanical fall at home after being pulled down by neighbors dog that she was trying to walk.  Severe left hip pain after fall, inability to ambulate.  Worse with movement.  Brought to [facility] and found to have L hip fracture.  No syncope, SOB, CP, etc either recently or with fall.  Has actually been very healthy at baseline and only takes a multivitamin.  Review of Systems: Systems reviewed.  As above, otherwise negative  History reviewed. No pertinent past medical history. History reviewed. No pertinent past surgical history. Social History:  reports that she has quit smoking. She does not have any smokeless tobacco history on file. Her alcohol and drug histories are not on file.  Allergies  Allergen Reactions  . Sulfa Antibiotics Hives    History reviewed. No pertinent family history.   Prior to Admission medications   Medication Sig Start Date End Date Taking? Authorizing Provider  Multiple Vitamins-Minerals (MULTIVITAMIN WITH MINERALS) tablet Take 1 tablet by mouth daily.   Yes Historical Provider, MD   Physical Exam: Filed Vitals:   12/27/14 2300  BP: 163/79  Pulse: 96  Temp:   Resp:     BP 163/79 mmHg  Pulse 96  Temp(Src) 98.5 F (36.9 C) (Oral)  Resp 20  Ht '5\' 5"'$  (1.651 m)  Wt 70.308 kg (155 lb)  BMI 25.79 kg/m2  SpO2 95%  General Appearance:    Alert, oriented, no distress, appears stated age  Head:    Normocephalic, atraumatic  Eyes:    PERRL, EOMI, sclera non-icteric        Nose:   Nares without drainage or epistaxis. Mucosa, turbinates normal  Throat:   Moist mucous membranes. Oropharynx without erythema or exudate.  Neck:   Supple. No carotid bruits.  No thyromegaly.  No lymphadenopathy.   Back:      No CVA tenderness, no spinal tenderness  Lungs:     Clear to auscultation bilaterally, without wheezes, rhonchi or rales  Chest wall:    No tenderness to palpitation  Heart:    Regular rate and rhythm without murmurs, gallops, rubs  Abdomen:     Soft, non-tender, nondistended, normal bowel sounds, no organomegaly  Genitalia:    deferred  Rectal:    deferred  Extremities:   No clubbing, cyanosis or edema.  Pulses:   2+ and symmetric all extremities  Skin:   Skin color, texture, turgor normal, no rashes or lesions  Lymph nodes:   Cervical, supraclavicular, and axillary nodes normal  Neurologic:   CNII-XII intact. Normal strength, sensation and reflexes      throughout    Labs on Admission:  Basic Metabolic Panel:  Recent Labs Lab 12/27/14 2119  NA 139  K 3.9  CL 103  CO2 26  GLUCOSE 115*  BUN 19  CREATININE 0.74  CALCIUM 9.3   Liver Function Tests: No results for input(s): AST, ALT, ALKPHOS, BILITOT, PROT, ALBUMIN in the last 168 hours. No results for input(s): LIPASE, AMYLASE in the last 168 hours. No results for input(s): AMMONIA in the last 168 hours. CBC:  Recent Labs Lab 12/27/14 2041 12/27/14 2119  WBC 17.2* 16.6*  NEUTROABS 15.6* 15.2*  HGB 14.6 13.8  HCT 43.7 40.1  MCV 87.9  87.2  PLT 259 243   Cardiac Enzymes: No results for input(s): CKTOTAL, CKMB, CKMBINDEX, TROPONINI in the last 168 hours.  BNP (last 3 results) No results for input(s): PROBNP in the last 8760 hours. CBG: No results for input(s): GLUCAP in the last 168 hours.  Radiological Exams on Admission: Dg Hip Unilat With Pelvis 2-3 Views Left  12/27/2014   CLINICAL DATA:  Left femur pain after fall.  EXAM: LEFT HIP (WITH PELVIS) 2-3 VIEWS  COMPARISON:  None.  FINDINGS: Malalignment of the femoral neck and head is highly suspicious for a subcapital femoral neck fracture.  Degenerative arthropathy of both hips, right greater than left.  Scattered vascular calcifications in the anatomic pelvis.   IMPRESSION: 1. Subcapital left femoral neck fracture 2. Degenerative arthropathy of both hips, right greater than left.   Electronically Signed   By: Van Clines M.D.   On: 12/27/2014 19:58   Dg Femur Min 2 Views Left  12/27/2014   CLINICAL DATA:  Pain following fall wall walking dog  EXAM: LEFT FEMUR 2 VIEWS  COMPARISON:  None.  FINDINGS: Frontal and lateral views were obtained. There is an impacted subcapital femoral neck fracture. No other fracture. No dislocation. There is moderate narrowing in the hip and knee joint regions. No knee joint effusion.  IMPRESSION: Evidence of an impacted subcapital femoral neck fracture. No dislocation. Osteoarthritic change in the knee and hip joints.   Electronically Signed   By: Lowella Grip III M.D.   On: 12/27/2014 19:59    EKG: Independently reviewed.  Assessment/Plan Principal Problem:   Fracture of femoral neck, left   1. L femoral neck fracture - 1. Ortho has seen patient and [facility] due [facility] 2. Ad[facility] 3. NPO after midnight 4. Hip fx pathway 5. OR tomorrow 6. ASA and SCDs for DVT ppx for now 7. No PMH (cardiopulmonary or otherwise), no recent CP, SOB, syncope, etc.  Seems very active and healthy for age at baseline (walks neighbors dog, on no meds for chronic diseases).    Code Status: Full Code  Family Communication: No family in room Disposition Plan: Admit to inpatient   Time spent: 70 min  GARDNER, JARED M. [facility] Pager [phone]  If 7AM-7PM, please contact the day team taking care of the patient [email or web address] Password [facility] 12/28/2014, 12:11 AM

## 2014-12-28 NOTE — Progress Notes (Signed)
Initial Nutrition Assessment  DOCUMENTATION CODES:  Not applicable  INTERVENTION:   (RD will follow for diet advancement, supplement diet as appropriate)  NUTRITION DIAGNOSIS:  Inadequate oral intake related to inability to eat as evidenced by NPO status.  GOAL:  Patient will meet greater than or equal to 90% of their needs  MONITOR:  PO intake, Diet advancement, Labs, Weight trends, Skin, I & O's  REASON FOR ASSESSMENT:  Consult Hip fracture protocol  ASSESSMENT: Sherri CROCKET is a 79 y.o. female who suffered a mechanical fall at home after being pulled down by neighbors dog that she was trying to walk. Severe left hip pain after fall, inability to ambulate. Worse with movement. Brought to [facility] and found to have L hip fracture. No syncope, SOB, CP, etc either recently or with fall. Has actually been very healthy at baseline and only takes a multivitamin.  Pt is currently NPO. Plan is to go to OR today for anterior hip replacement due to lt femoral neck fx.  Pt reports she is not hungry currently due to anxiety about surgery. PTA, pt reports her appetite was "too good". She reports she consumes 3 meals per day (breakfast of scrambled eggs and toast, lunch of 1/2 sandwich and fruit, and dinner of meatloaf, mashed potatoes, and vegetables). She is able to prepare meals herself, but does admit to going out to eat frequently, as she lives by herself.   She reports UBW of 155#. She denies any weight loss. She is very active at baseline and walks her dogs daily. Nutrition-Focused physical exam completed. Findings are no fat depletion, no muscle depletion, and no edema.   Discussed importance of good PO intake upon diet advancement to assist with healing process.   Height:  Ht Readings from Last 1 Encounters:  12/28/14 '5\' 5"'$  (1.651 m)    Weight:  Wt Readings from Last 1 Encounters:  12/28/14 158 lb 15.2 oz (72.1 kg)    Ideal Body Weight:  56.8 kg  Wt Readings from  Last 10 Encounters:  12/28/14 158 lb 15.2 oz (72.1 kg)    BMI:  Body mass index is 26.45 kg/(m^2).  Estimated Nutritional Needs:  Kcal:  1600-1800  Protein:  80-90 grams  Fluid:  1.6-1.8 L  Skin:  Reviewed, no issues  Diet Order:  Diet NPO time specified  EDUCATION NEEDS:  No education needs identified at this time  No intake or output data in the 24 hours ending 12/28/14 1000  Last BM:  PTA  Barnett Elzey A. Jimmye Norman, RD, LDN, CDE Pager: [phone] After hours Pager: [phone]

## 2014-12-28 NOTE — Progress Notes (Signed)
I have seen and examined the patient.  Patient is highly functional and a community ambulator and is fully independent.  Discussed THA with patient and the r/b/a.  She agrees to proceed with THA today.

## 2014-12-28 NOTE — Progress Notes (Signed)
Patient ID: Sherri Mendez, female   DOB: 12/01/1935, 79 y.o.   MRN: [id number] Ms. Tilmon is comfortable this am.  She understands that surgery will be later today and that an anterior hip replacement will be performed to treat her left femoral neck fracture by Dr. Erlinda Hong.

## 2014-12-28 NOTE — Anesthesia Procedure Notes (Signed)
Spinal Patient location during procedure: OR Start time: 12/28/2014 2:14 PM End time: 12/28/2014 2:23 PM Staffing Anesthesiologist: Duane Boston Performed by: anesthesiologist  Preanesthetic Checklist Completed: patient identified, surgical consent, pre-op evaluation, timeout performed, IV checked, risks and benefits discussed and monitors and equipment checked Spinal Block Patient position: sitting Prep: Betadine Patient monitoring: cardiac monitor, continuous pulse ox and blood pressure Approach: left paramedian Location: L2-3 Injection technique: single-shot Needle Needle type: Pencan  Needle gauge: 24 G Needle length: 9 cm Additional Notes Functioning IV was confirmed and monitors were applied. Sterile prep and drape, including hand hygiene and sterile gloves were used. The patient was positioned and the spine was prepped. The skin was anesthetized with lidocaine.  Free flow of clear CSF was obtained prior to injecting local anesthetic into the CSF.  The spinal needle aspirated freely following injection.  The needle was carefully withdrawn.  The patient tolerated the procedure well.

## 2014-12-28 NOTE — Anesthesia Preprocedure Evaluation (Addendum)
Anesthesia Evaluation  Patient identified by MRN, date of birth, ID band Patient awake    Reviewed: Allergy & Precautions, NPO status , Patient's Chart, lab work & pertinent test results  History of Anesthesia Complications Negative for: history of anesthetic complications  Airway Mallampati: II  TM Distance: >3 FB Neck ROM: Full    Dental  (+) Teeth Intact, Dental Advisory Given, Caps   Pulmonary neg pulmonary ROS, former smoker,    Pulmonary exam normal       Cardiovascular negative cardio ROS Normal cardiovascular exam    Neuro/Psych    GI/Hepatic negative GI ROS, Neg liver ROS,   Endo/Other  negative endocrine ROS  Renal/GU negative Renal ROS     Musculoskeletal   Abdominal   Peds  Hematology negative hematology ROS (+)   Anesthesia Other Findings Crowns upper front  Reproductive/Obstetrics                         Anesthesia Physical Anesthesia Plan  ASA: II  Anesthesia Plan: MAC and Spinal   Post-op Pain Management:    Induction: Intravenous  Airway Management Planned: Simple Face Mask  Additional Equipment:   Intra-op Plan:   Post-operative Plan:   Informed Consent: I have reviewed the patients History and Physical, chart, labs and discussed the procedure including the risks, benefits and alternatives for the proposed anesthesia with the patient or authorized representative who has indicated his/her understanding and acceptance.   Dental advisory given  Plan Discussed with: CRNA, Anesthesiologist and Surgeon  Anesthesia Plan Comments:        Anesthesia Quick Evaluation

## 2014-12-28 NOTE — Op Note (Signed)
TOTAL HIP ARTHROPLASTY ANTERIOR APPROACH  Procedure Note VERNITA TAGUE   [id number]  Pre-op Diagnosis: Left hip fracture     Post-op Diagnosis: same   Operative Procedures  1. Total hip replacement; Left hip; uncemented [id number]   Personnel  Surgeon(s): Leandrew Koyanagi, MD   Anesthesia: spinal  Prosthesis: Tamala Julian and Nephew Acetabulum: R3 50 mm Femur: Polarstem Size 2 Std Head: 32 mm size: +8 Bearing Type: Oxinium  Date of Service: 12/27/2014 - 12/28/2014  Total Hip Arthroplasty (Anterior Approach) Op Note:  After informed consent was obtained and the operative extremity marked in the holding area, the patient was brought back to the operating room and placed supine on the HANA table. Next, the operative extremity was prepped and draped in normal sterile fashion. Surgical timeout occurred verifying patient identification, surgical site, surgical procedure and administration of antibiotics.  A modified anterior Smith-Peterson approach to the hip was performed, using the interval between tensor fascia lata and sartorius.  Dissection was carried bluntly down onto the anterior hip capsule. The lateral femoral circumflex vessels were identified and coagulated. A capsulotomy was performed and the capsular flaps tagged for later repair.  Fluoroscopy was utilized to prepare for the femoral neck cut. The neck osteotomy was performed. The femoral head was removed, the acetabular rim was cleared of soft tissue and attention was turned to reaming the acetabulum.  Sequential reaming was performed under fluoroscopic guidance. We reamed to a size 50 mm, and then impacted the acetabular shell. The liner was then placed after irrigation and attention turned to the femur.  After placing the femoral hook, the leg was taken to externally rotated, extended and adducted position taking care to perform soft tissue releases to allow for adequate mobilization of the femur. Soft tissue was cleared from the shoulder  of the greater trochanter and the hook elevator used to improve exposure of the proximal femur. Sequential broaching performed up to a size 2. Trial neck and head were placed. The leg was brought back up to neutral and the construct reduced. The position and sizing of components, offset and leg lengths were checked using fluoroscopy. Stability of the construct was checked in extension and external rotation without any subluxation or impingement of prosthesis. We dislocated the prosthesis, dropped the leg back into position, removed trial components, and irrigated copiously. The final stem and head was then placed, the leg brought back up, the system reduced and fluoroscopy used to verify positioning.  We irrigated, obtained hemostasis and closed the capsule using #2 ethibond suture.  Dilute betadyne solution was used. The fascia was closed with #1 vicryl plus, the deep fat layer was closed with 0 vicryl, the subcutaneous layers closed with 2.0 Vicryl Plus and the skin closed with staples. A sterile dressing was applied. The patient was awakened in the operating room and taken to recovery in stable condition.  All sponge, needle, and instrument counts were correct at the end of the case.   Position: supine  Complications: none.  Time Out: performed   Drains/Packing: none  Estimated blood loss: 150 cc  Returned to Recovery Room: in good condition.   Antibiotics: yes   Mechanical VTE (DVT) Prophylaxis: sequential compression devices, TED thigh-high  Chemical VTE (DVT) Prophylaxis: lovenox (other pharmacologic prophylaxis not indicated - bleeding risk)   Fluid Replacement: see anesthesia record  Specimens Removed: 1 to pathology   Sponge and Instrument Count Correct? yes   PACU: portable radiograph - low AP   Admission: inpatient status,  start PT & OT POD#1  Plan/RTC: Return in 2 weeks for staple removal. Return in 6 weeks to see MD.  Weight Bearing/Load Lower Extremity: full  Hip  precautions: none Suture Removal: 10-14 days  Betadine to incision twice daily once dressing is removed on POD#7  N. Eduard Roux, MD [place] [phone] 4:21 PM      Implant Name Type Inv. Item Serial No. Manufacturer Lot No. LRB No. Used  LINER 0 DEG 32X50MM - [id number] Hips LINER 0 DEG 32X50MM  [employer] [id number] Left 1  CUP R3 50MM - [id number] Hips CUP R3 50MM  [employer] [id number] Left 1  SCREW SPHERICAL HEAD 6.5X35 - [id number] Screw SCREW SPHERICAL HEAD 6.5X35  [employer] [id number] Left 1  size 2 std ti/ha stem with collar Stem   [employer] [id number] Left 1  61m +8 oxinium femoral head Head     [employer] [id number] 1

## 2014-12-28 NOTE — Progress Notes (Signed)
PROGRESS NOTE    Sherri Mendez TGG:[id number] DOB: 1936-06-23 DOA: 12/27/2014 PCP: No primary care provider on file.  HPI/Brief narrative 79 y.o. female with no significant PMH, who suffered a mechanical fall at home after being pulled down by neighbors dog that she was trying to walk.She presented with severe left hip pain after fall & inability to ambulate. She was found to have left hip fracture and was transferred from [facility] ED to [facility] as per orthopedic recommendations, for surgery.  Assessment/Plan:  Left hip femoral neck fracture - Sustained post mechanical fall - Orthopedics consultation appreciated. They recommended transferring patient from [facility] to [facility] - Patient supposed to have left total hip arthroplasty 12/28/14. - Based on available documentation, patient is at low risk for preoperative CV events and may proceed with surgery without any further cardiovascular workup. - Postop wound care, weightbearing, pain management and DVT prophylaxis as per orthopedics.  Leukocytosis - Likely stress response. Follow CBCs  Elevated blood pressure - Possibly from pain response. When necessary IV hydralazine.   DVT prophylaxis: To be determined by orthopedics postop Code Status: Full Family Communication: None at bedside Disposition Plan: To be determined-possibly SNF in 2-3 days   Consultants:  Orthopedics  Procedures:  None  Antibiotics:  None   Subjective: Left hip pain controlled. No chest pain, dyspnea or palpitations.  Objective: Filed Vitals:   12/28/14 0056 12/28/14 0136 12/28/14 0527 12/28/14 1024  BP: 113/67 157/61 151/61 175/67  Pulse: 82 86 83 83  Temp:  99 F (37.2 C) 98.8 F (37.1 C) 98.6 F (37 C)  TempSrc:  Oral  Oral  Resp: '16 16 16 16  '$ Height:  '5\' 5"'$  (1.651 m)    Weight:  72.1 kg (158 lb 15.2 oz)    SpO2: 94% 93% 96% 94%    Intake/Output Summary (Last 24 hours) at 12/28/14 1355 Last data filed at 12/28/14 0900  Gross per 24 hour  Intake      0 ml  Output      0 ml  Net      0 ml   Filed Weights   12/27/14 1821 12/28/14 0136  Weight: 70.308 kg (155 lb) 72.1 kg (158 lb 15.2 oz)     Exam:  General exam: Pleasant elderly female lying comfortably in bed. Respiratory system: Clear. No increased work of breathing. Cardiovascular system: S1 & S2 heard, RRR. No JVD, murmurs, gallops, clicks or pedal edema. Gastrointestinal system: Abdomen is nondistended, soft and nontender. Normal bowel sounds heard. Central nervous system: Alert and oriented. No focal neurological deficits. Extremities: Symmetric 5 x 5 power except left lower extremity where pain assessment is limited secondary to pain. Appears slightly shortened and internally rotated..   Data Reviewed: Basic Metabolic Panel:  Recent Labs Lab 12/27/14 2119  NA 139  K 3.9  CL 103  CO2 26  GLUCOSE 115*  BUN 19  CREATININE 0.74  CALCIUM 9.3   Liver Function Tests: No results for input(s): AST, ALT, ALKPHOS, BILITOT, PROT, ALBUMIN in the last 168 hours. No results for input(s): LIPASE, AMYLASE in the last 168 hours. No results for input(s): AMMONIA in the last 168 hours. CBC:  Recent Labs Lab 12/27/14 2041 12/27/14 2119  WBC 17.2* 16.6*  NEUTROABS 15.6* 15.2*  HGB 14.6 13.8  HCT 43.7 40.1  MCV 87.9 87.2  PLT 259 243   Cardiac Enzymes: No results for input(s): CKTOTAL, CKMB, CKMBINDEX, TROPONINI in the last 168 hours. BNP (last 3 results)  No results for input(s): PROBNP in the last 8760 hours. CBG: No results for input(s): GLUCAP in the last 168 hours.  Recent Results (from the past 240 hour(s))  Surgical pcr screen     Status: None   Collection Time: 12/28/14 10:33 AM  Result Value Ref Range Status   MRSA, PCR NEGATIVE NEGATIVE Final   Staphylococcus aureus NEGATIVE NEGATIVE Final    Comment:        The Xpert SA Assay (FDA approved for NASAL specimens in patients over 45 years of age), is one component of a  comprehensive surveillance program.  Test performance has been validated by [facility] for patients greater than or equal to 94 year old. It is not intended to diagnose infection nor to guide or monitor treatment.          Studies: Dg Hip Unilat With Pelvis 2-3 Views Left  12/27/2014   CLINICAL DATA:  Left femur pain after fall.  EXAM: LEFT HIP (WITH PELVIS) 2-3 VIEWS  COMPARISON:  None.  FINDINGS: Malalignment of the femoral neck and head is highly suspicious for a subcapital femoral neck fracture.  Degenerative arthropathy of both hips, right greater than left.  Scattered vascular calcifications in the anatomic pelvis.  IMPRESSION: 1. Subcapital left femoral neck fracture 2. Degenerative arthropathy of both hips, right greater than left.   Electronically Signed   By: Van Clines M.D.   On: 12/27/2014 19:58   Dg Femur Min 2 Views Left  12/27/2014   CLINICAL DATA:  Pain following fall wall walking dog  EXAM: LEFT FEMUR 2 VIEWS  COMPARISON:  None.  FINDINGS: Frontal and lateral views were obtained. There is an impacted subcapital femoral neck fracture. No other fracture. No dislocation. There is moderate narrowing in the hip and knee joint regions. No knee joint effusion.  IMPRESSION: Evidence of an impacted subcapital femoral neck fracture. No dislocation. Osteoarthritic change in the knee and hip joints.   Electronically Signed   By: Lowella Grip III M.D.   On: 12/27/2014 19:59        Scheduled Meds:   Continuous Infusions: . lactated ringers 50 mL/hr (12/28/14 1323)    Principal Problem:   Fracture of femoral neck, left    Time spent: 25 minutes    Kree Rafter, MD, FACP, FHM. [facility] Pager [phone]  If 7PM-7AM, please contact night-coverage [email or web address] Password [email or web address] 12/28/2014, 1:55 PM    LOS: 0 days

## 2014-12-28 NOTE — H&P (Signed)

## 2014-12-28 NOTE — Anesthesia Postprocedure Evaluation (Signed)
Anesthesia Post Note  Patient: Sherri Mendez  Procedure(s) Performed: Procedure(s) (LRB): TOTAL HIP ARTHROPLASTY ANTERIOR APPROACH (Left)  Anesthesia type: MAC/SAB  Patient location: PACU  Post pain: Pain level controlled  Post assessment: Patient's Cardiovascular Status Stable, SAB receding  Last Vitals:  Filed Vitals:   12/28/14 1645  BP: 107/61  Pulse: 80  Temp:   Resp: 16    Post vital signs: Reviewed and stable  Level of consciousness: sedated  Complications: No apparent anesthesia complications

## 2014-12-28 NOTE — Care Management Note (Signed)
Case Management Note  Patient Details  Name: Sherri Mendez MRN: [id number] Date of Birth: 09-11-1935  Subjective/Objective:                    Action/Plan:  Awaiting post op PT eval for home health needs  Expected Discharge Date:   (unknown)               Expected Discharge Plan:     In-House Referral:     Discharge planning Services     Post Acute Care Choice:    Choice offered to:     DME Arranged:    DME Agency:     HH Arranged:    [place] Agency:     Status of Service:     Medicare Important Message Given:    Date Medicare IM Given:    Medicare IM give by:    Date Additional Medicare IM Given:    Additional Medicare Important Message give by:     If discussed at [place] of Stay Meetings, dates discussed:    Additional Comments:  Daiya, Tamer, RN 12/28/2014, 10:54 AM

## 2014-12-28 NOTE — Progress Notes (Signed)
Orthopedic Tech Progress Note Patient Details:  Sherri Mendez [birth year] [id number]  Patient ID: [name], female   DOB: 1935/08/12, 79 y.o.   MRN: [id number] Pt unable to use trapeze bar patient helper  Hildred Priest 12/28/2014, 7:23 AM

## 2014-12-29 ENCOUNTER — Encounter (HOSPITAL_COMMUNITY): Payer: Self-pay | Admitting: Orthopaedic Surgery

## 2014-12-29 DIAGNOSIS — D62 Acute posthemorrhagic anemia: Secondary | ICD-10-CM

## 2014-12-29 DIAGNOSIS — E876 Hypokalemia: Secondary | ICD-10-CM

## 2014-12-29 DIAGNOSIS — S72002D Fracture of unspecified part of neck of left femur, subsequent encounter for closed fracture with routine healing: Secondary | ICD-10-CM

## 2014-12-29 LAB — BASIC METABOLIC PANEL
ANION GAP: 7 (ref 5–15)
BUN: 10 mg/dL (ref 6–20)
CHLORIDE: 102 mmol/L (ref 101–111)
CO2: 26 mmol/L (ref 22–32)
CREATININE: 0.71 mg/dL (ref 0.44–1.00)
Calcium: 7.5 mg/dL — ABNORMAL LOW (ref 8.9–10.3)
GFR calc non Af Amer: >60 mL/min (ref >60)
GLUCOSE: 128 mg/dL — AB (ref 65–99)
Potassium: 3.4 mmol/L — ABNORMAL LOW (ref 3.5–5.1)
Sodium: 135 mmol/L (ref 135–145)

## 2014-12-29 LAB — CBC
HCT: 30.6 % — ABNORMAL LOW (ref 36.0–46.0)
Hemoglobin: 10.4 g/dL — ABNORMAL LOW (ref 12.0–15.0)
MCH: 30.1 pg (ref 26.0–34.0)
MCHC: 34 g/dL (ref 30.0–36.0)
MCV: 88.7 fL (ref 78.0–100.0)
Platelets: 195 10*3/uL (ref 150–400)
RBC: 3.45 MIL/uL — ABNORMAL LOW (ref 3.87–5.11)
RDW: 13.4 % (ref 11.5–15.5)
WBC: 9.1 10*3/uL (ref 4.0–10.5)

## 2014-12-29 LAB — MAGNESIUM: MAGNESIUM: 2 mg/dL (ref 1.7–2.4)

## 2014-12-29 MED ORDER — POTASSIUM CHLORIDE CRYS ER 20 MEQ PO TBCR
40.0000 meq | EXTENDED_RELEASE_TABLET | Freq: Once | ORAL | Status: AC
  Administered 2014-12-29: 40 meq via ORAL
  Filled 2014-12-29: qty 2

## 2014-12-29 NOTE — Progress Notes (Signed)
Orthopedic Tech Progress Note Patient Details:  AGUEDA HOUPT 07-Oct-1935 [id number]  Patient ID: [name], female   DOB: 1936-06-24, 79 y.o.   MRN: [id number] Pt unable to use trapeze bar patient helper  Hildred Priest 12/29/2014, 6:19 AM

## 2014-12-29 NOTE — Clinical Social Work Note (Signed)
CSW received referral for SNF.  Case discussed with case manager and plan is to discharge home with home health.  CSW to sign off please re-consult if social work needs arise.  Jones Broom. [place], MSW, [place]

## 2014-12-29 NOTE — OR Nursing (Signed)
Late entry for delay code documentation. 

## 2014-12-29 NOTE — Evaluation (Signed)
Physical Therapy Evaluation Patient Details Name: Sherri Mendez MRN: [id number] DOB: 06/01/1936 Today's Date: 12/29/2014   History of Present Illness  Patient is a 79 yo female admitted 12/27/14 following fall while walking neighbor's dog.  Patient with Lt hip fx, and s/p Lt THA, direct anterior approach.   Clinical Impression  Patient presents with problems listed below.  Will benefit from acute PT to maximize functional independence prior to discharge.  Patient will need 24 hour assist at home initially.  Patient states her daughter and neighbors can provide - she is checking with them.  Patient would need to reach min guard functional level to d/c home.  Recommend HHPT for f/u therapy.  If unable to get 24 hour assist or reach min guard assist, may need to consider ST-SNF.    Follow Up Recommendations Home health PT;Supervision/Assistance - 24 hour    Equipment Recommendations  None recommended by PT    Recommendations for Other Services       Precautions / Restrictions Precautions Precautions: Fall Precaution Comments: No hip precautions - direct anterior approach Restrictions Weight Bearing Restrictions: Yes LLE Weight Bearing: Weight bearing as tolerated      Mobility  Bed Mobility Overal bed mobility: Needs Assistance Bed Mobility: Supine to Sit     Supine to sit: Mod assist     General bed mobility comments: Verbal cues for technique.  Patient required assist to bring LLE off of bed, and to raise trunk to sitting position.  Cues to use UE's on bed to push self up (rather than pull on PT).  Once upright, patient with fair sitting balance.  Transfers Overall transfer level: Needs assistance Equipment used: Rolling walker (2 wheeled) Transfers: Sit to/from [facility] Sit to Stand: Mod assist;+2 safety/equipment Stand pivot transfers: Mod assist;+2 safety/equipment       General transfer comment: Verbal cues for hand placement.  Assist to power up  to standing from bed and BSC, and for balance.  Patient able to pivot to [facility] with mod assist.  Ambulation/Gait Ambulation/Gait assistance: Mod assist;+2 safety/equipment Ambulation Distance (Feet): 3 Feet Assistive device: Rolling walker (2 wheeled) Gait Pattern/deviations: Step-to pattern;Decreased stance time - left;Decreased step length - right;Decreased stride length;Antalgic Gait velocity: Decreased Gait velocity interpretation: Below normal speed for age/gender General Gait Details: Verbal cues for gait sequence and safe use of RW.  Required assist to advance LLE during swing phase.  Patient able to ambulate 3', and became dizzy.  Returned to sitting in chair.  Stairs            Wheelchair Mobility    Modified Rankin (Stroke Patients Only)       Balance                                             Pertinent Vitals/Pain Pain Assessment: 0-10 Pain Score: 5  Pain Location: LLE Pain Descriptors / Indicators: Aching;Sore Pain Intervention(s): Limited activity within patient's tolerance;Repositioned;Patient requesting pain meds-RN notified    Home Living Family/patient expects to be discharged to:: Private residence Living Arrangements: Alone Available Help at Discharge: Family;Neighbor;Available 24 hours/day (Patient thinks she can have 24 hour assist at d/c) Type of Home: House (Condo) Home Access: Stairs to enter Entrance Stairs-Rails: None Entrance Stairs-Number of Steps: 1 Home Layout: One level Home Equipment: Environmental consultant - 2 wheels;Bedside commode Additional Comments: Pt states she has a  RW and her neigthbor has a 3-in-1 she can use    Prior Function Level of Independence: Independent               Hand Dominance   Dominant Hand: Right    Extremity/Trunk Assessment   Upper Extremity Assessment: Defer to OT evaluation           Lower Extremity Assessment: LLE deficits/detail   LLE Deficits / Details: Decreased strength and ROM  due to pain/surgery  Cervical / Trunk Assessment: Normal  Communication   Communication: No difficulties  Cognition Arousal/Alertness: Awake/alert Behavior During Therapy: WFL for tasks assessed/performed Overall Cognitive Status: Within Functional Limits for tasks assessed                      General Comments      Exercises        Assessment/Plan    PT Assessment Patient needs continued PT services  PT Diagnosis Difficulty walking;Generalized weakness;Acute pain   PT Problem List Decreased strength;Decreased activity tolerance;Decreased balance;Decreased mobility;Decreased knowledge of use of DME;Pain  PT Treatment Interventions DME instruction;Gait training;Stair training;Functional mobility training;Therapeutic activities;Therapeutic exercise;Patient/family education   PT Goals (Current goals can be found in the Care Plan section) Acute Rehab PT Goals Patient Stated Goal: To return home PT Goal Formulation: With patient Time For Goal Achievement: 01/05/15 Potential to Achieve Goals: Good    Frequency 7X/week   Barriers to discharge Decreased caregiver support Patient lives alone    Co-evaluation               End of Session Equipment Utilized During Treatment: Gait belt Activity Tolerance: Patient limited by fatigue;Patient limited by pain (Limited by lightheadedness) Patient left: in chair;with call bell/phone within reach Nurse Communication: Mobility status         Time: 1425-1451 PT Time Calculation (min) (ACUTE ONLY): 26 min   Charges:   PT Evaluation $Initial PT Evaluation Tier I: 1 Procedure PT Treatments $Therapeutic Activity: 8-22 mins   PT G Codes:        Despina Pole 01-18-15, 3:17 PM Carita Pian. Sanjuana Kava, [place] Pager [phone]

## 2014-12-29 NOTE — Progress Notes (Signed)
PROGRESS NOTE    Sherri Mendez GYF:[id number] DOB: [birth year] DOA: 12/27/2014 PCP: No primary care provider on file.  HPI/Brief narrative 79 y.o. female with no significant PMH, who suffered a mechanical fall at home after being pulled down by neighbors dog that she was trying to walk.She presented with severe left hip pain after fall & inability to ambulate. She was found to have left hip fracture and was transferred from [facility] ED to [facility] as per orthopedic recommendations, for surgery. S/P L THA 12/28/14  Assessment/Plan:  Left hip femoral neck fracture - Sustained post mechanical fall - S/P left total hip replacement on 6/29 - Postop wound care, weightbearing (WBAT), pain management and DVT prophylaxis (SCDs, ambulation, Lovenox) as per orthopedics.  Leukocytosis - Likely stress response. Resolved  Elevated blood pressure - Possibly from pain response. When necessary IV hydralazine. - Controlled/resolved  Hypokalemia - Replace and follow. Magnesium normal  Expected postop acute blood loss anemia - Follow CBCs   DVT prophylaxis: Lovenox  Code Status: Full Family Communication: None at bedside Disposition Plan:Possible DC 12/30/14-? SNF  Consultants:  Orthopedics  Procedures:  12/28/14: Total hip replacement; Left hip; uncemented [id number]   Foley catheter  Antibiotics:  None   Subjective: Appropriate postop left hip pain but able to lift left lower extremity. Denies any other complaints.  Objective: Filed Vitals:   12/28/14 2126 12/29/14 0216 12/29/14 0657 12/29/14 0822  BP: 130/66 134/58 129/60   Pulse: 81 86 86   Temp: 98.5 F (36.9 C) 98.8 F (37.1 C) 99.1 F (37.3 C)   TempSrc: Oral     Resp: '19 17 16   '$ Height:      Weight:      SpO2: 98% 98% 99% 97%    Intake/Output Summary (Last 24 hours) at 12/29/14 1453 Last data filed at 12/29/14 0800  Gross per 24 hour  Intake 2735.42 ml  Output   2250 ml  Net 485.42 ml   Filed Weights   12/27/14 1821 12/28/14 0136  Weight: 70.308 kg (155 lb) 72.1 kg (158 lb 15.2 oz)     Exam:  General exam: Pleasant elderly female seen sitting up comfortably in bed eating breakfast this morning. Respiratory system: Clear. No increased work of breathing. Cardiovascular system: S1 & S2 heard, RRR. No JVD, murmurs, gallops, clicks or pedal edema. Gastrointestinal system: Abdomen is nondistended, soft and nontender. Normal bowel sounds heard. Central nervous system: Alert and oriented. No focal neurological deficits. Extremities: Symmetric 5 x 5 power except left lower extremity where pain assessment is limited secondary to pain. Surgical site left hip dressing clean and dry.   Data Reviewed: Basic Metabolic Panel:  Recent Labs Lab 12/27/14 2119 12/28/14 1953 12/29/14 0314  NA 139  --  135  K 3.9  --  3.4*  CL 103  --  102  CO2 26  --  26  GLUCOSE 115*  --  128*  BUN 19  --  10  CREATININE 0.74 0.76 0.71  CALCIUM 9.3  --  7.5*  MG  --   --  2.0   Liver Function Tests: No results for input(s): AST, ALT, ALKPHOS, BILITOT, PROT, ALBUMIN in the last 168 hours. No results for input(s): LIPASE, AMYLASE in the last 168 hours. No results for input(s): AMMONIA in the last 168 hours. CBC:  Recent Labs Lab 12/27/14 2041 12/27/14 2119 12/28/14 1953 12/29/14 0314  WBC 17.2* 16.6* 15.4* 9.1  NEUTROABS 15.6* 15.2*  --   --  HGB 14.6 13.8 11.5* 10.4*  HCT 43.7 40.1 34.0* 30.6*  MCV 87.9 87.2 88.3 88.7  PLT 259 243 223 195   Cardiac Enzymes: No results for input(s): CKTOTAL, CKMB, CKMBINDEX, TROPONINI in the last 168 hours. BNP (last 3 results) No results for input(s): PROBNP in the last 8760 hours. CBG: No results for input(s): GLUCAP in the last 168 hours.  Recent Results (from the past 240 hour(s))  Surgical pcr screen     Status: None   Collection Time: 12/28/14 10:33 AM  Result Value Ref Range Status   MRSA, PCR NEGATIVE NEGATIVE Final   Staphylococcus aureus  NEGATIVE NEGATIVE Final    Comment:        The Xpert SA Assay (FDA approved for NASAL specimens in patients over 31 years of age), is one component of a comprehensive surveillance program.  Test performance has been validated by [facility] for patients greater than or equal to 41 year old. It is not intended to diagnose infection nor to guide or monitor treatment.          Studies: Pelvis Portable  12/28/2014   CLINICAL DATA:  Status post left hip replacement.  EXAM: PORTABLE PELVIS 1-2 VIEWS  COMPARISON:  None.  FINDINGS: Left hip prosthetic components are well-seated and aligned on this single view. There is no acute fracture or evidence of an operative complication.  IMPRESSION: Well aligned left hip prosthesis.   Electronically Signed   By: Lajean Manes M.D.   On: 12/28/2014 17:06   Dg Hip Operative Unilat With Pelvis Left  12/28/2014   CLINICAL DATA:  LEFT hip replacement anterior approach  EXAM: OPERATIVE LEFT HIP (WITH PELVIS IF PERFORMED) 2 VIEWS  TECHNIQUE: Fluoroscopic spot image(s) were submitted for interpretation post-operatively.  FLUOROSCOPY TIME:  Radiation Exposure Index (as provided by the fluoroscopic device): Not provided  If the device does not provide the exposure index:  Fluoroscopy Time:  0 minutes 34 second  Number of Acquired Images:  2  COMPARISON:  12/27/2014 preoperative exam  FINDINGS: Mild osseous demineralization.  LEFT hip prosthesis identified in expected position.  No fracture or dislocation.  Visualized LEFT pelvis intact.  IMPRESSION: LEFT hip prosthesis without acute complication.   Electronically Signed   By: Lavonia Dana M.D.   On: 12/28/2014 16:01   Dg Hip Unilat With Pelvis 2-3 Views Left  12/27/2014   CLINICAL DATA:  Left femur pain after fall.  EXAM: LEFT HIP (WITH PELVIS) 2-3 VIEWS  COMPARISON:  None.  FINDINGS: Malalignment of the femoral neck and head is highly suspicious for a subcapital femoral neck fracture.  Degenerative arthropathy of  both hips, right greater than left.  Scattered vascular calcifications in the anatomic pelvis.  IMPRESSION: 1. Subcapital left femoral neck fracture 2. Degenerative arthropathy of both hips, right greater than left.   Electronically Signed   By: Van Clines M.D.   On: 12/27/2014 19:58   Dg Femur Min 2 Views Left  12/27/2014   CLINICAL DATA:  Pain following fall wall walking dog  EXAM: LEFT FEMUR 2 VIEWS  COMPARISON:  None.  FINDINGS: Frontal and lateral views were obtained. There is an impacted subcapital femoral neck fracture. No other fracture. No dislocation. There is moderate narrowing in the hip and knee joint regions. No knee joint effusion.  IMPRESSION: Evidence of an impacted subcapital femoral neck fracture. No dislocation. Osteoarthritic change in the knee and hip joints.   Electronically Signed   By: Lowella Grip III M.D.  On: 12/27/2014 19:59        Scheduled Meds: . enoxaparin (LOVENOX) injection  40 mg Subcutaneous Q24H   Continuous Infusions: . sodium chloride 125 mL/hr at 12/28/14 1831  . lactated ringers 50 mL/hr (12/28/14 1323)    Principal Problem:   Fracture of femoral neck, left Active Problems:   Leukocytosis   Elevated blood pressure    Time spent: 25 minutes    Asaf Elmquist, MD, FACP, FHM. [facility] Pager [phone]  If 7PM-7AM, please contact night-coverage [email or web address] Password [email or web address] 12/29/2014, 2:53 PM    LOS: 1 day

## 2014-12-29 NOTE — Progress Notes (Signed)
   Subjective:  Patient reports pain as mild.  No events.  Objective:   VITALS:   Filed Vitals:   12/28/14 1837 12/28/14 2126 12/29/14 0216 12/29/14 0657  BP: 131/50 130/66 134/58 129/60  Pulse: 80 81 86 86  Temp:  98.5 F (36.9 C) 98.8 F (37.1 C) 99.1 F (37.3 C)  TempSrc:  Oral    Resp: '20 19 17 16  '$ Height:      Weight:      SpO2: 100% 98% 98% 99%    Neurologically intact Neurovascular intact Sensation intact distally Intact pulses distally Dorsiflexion/Plantar flexion intact Incision: dressing C/D/I and no drainage No cellulitis present Compartment soft  Femoral nerve function intact   Lab Results  Component Value Date   WBC 9.1 12/29/2014   HGB 10.4* 12/29/2014   HCT 30.6* 12/29/2014   MCV 88.7 12/29/2014   PLT 195 12/29/2014     Assessment/Plan:  1 Day Post-Op   - Expected postop acute blood loss anemia - will monitor for symptoms - Up with PT/OT - DVT ppx - SCDs, ambulation, lovenox - WBAT left lower extremity - Pain control - Discharge planning - Rx in chart  Marianna Payment 12/29/2014, 7:35 AM [phone]

## 2014-12-29 NOTE — Care Management Note (Signed)
Case Management Note  Patient Details  Name: VENISA FRAMPTON MRN: [id number] Date of Birth: February 19, 1936  Subjective/Objective:                    Action/Plan: Discussed home health with patient . Confirmed face sheet information . Provided list of home health agencies for [facility].   Patient is aware PT recommending 24 hour assistance , patient states she has already talked with a neighbor who can help her and her daughter in coming to visit her this afternoon and she can help as well.   Stressed to  patient  PT recommending someone stays with her around the clock. Patinet states she understands and will discussed with her daughter this afternoon . Will follow up tomorrow am.   Expected Discharge Date:   (unknown)               Expected Discharge Plan:  [place]  In-House Referral:     Discharge planning Services  CM Consult  Post Acute Care Choice:  Home Health Choice offered to:     DME Arranged:    DME Agency:     HH Arranged:  PT HH Agency:     Status of Service:  In process, will continue to follow  Medicare Important Message Given:    Date Medicare IM Given:    Medicare IM give by:    Date Additional Medicare IM Given:    Additional Medicare Important Message give by:     If discussed at [place] of Stay Meetings, dates discussed:    Additional Comments:  Clotilde, Loth, RN 12/29/2014, 3:29 PM

## 2014-12-29 NOTE — Care Management Note (Signed)
Case Management Note  Patient Details  Name: Sherri Mendez MRN: [id number] Date of Birth: Aug 22, 1935  Subjective/Objective:                    Action/Plan: Will await PT /OT eval   Expected Discharge Date:   (unknown)               Expected Discharge Plan:     In-House Referral:  Clinical Social Work  Discharge planning Services  CM Consult  Post Acute Care Choice:    Choice offered to:     DME Arranged:    DME Agency:     HH Arranged:    [place] Agency:     Status of Service:  In process, will continue to follow  Medicare Important Message Given:    Date Medicare IM Given:    Medicare IM give by:    Date Additional Medicare IM Given:    Additional Medicare Important Message give by:     If discussed at [place] of Stay Meetings, dates discussed:    Additional Comments:  Keaghan, [place], RN 12/29/2014, 10:27 AM

## 2014-12-29 NOTE — Progress Notes (Signed)
Occupational Therapy Evaluation Patient Details Name: Sherri Mendez MRN: [id number] DOB: [birth year] Today's Date: 12/29/2014    History of Present Illness Patient is a 79 yo female admitted 12/27/14 following fall while walking neighbor's dog.  Patient with Lt hip fx, and s/p Lt THA, direct anterior approach.    Clinical Impression   Patient presenting with decreased independence in the areas of ADLs, IADLs, functional mobility secondary to above. Patient independent PTA. Patient currently functioning at an overall min->mod assist level. Patient will benefit from acute OT to increase overall independence in the areas of ADLs, functional mobility, and overall safety in order to safely discharge home with HHOT and 24/7 supervision/assistance. IF pt does not have 24/7 post acute d/c, recommending ST SNF.     Follow Up Recommendations  Home health OT;Supervision/Assistance - 24 hour    Equipment Recommendations  Other (comment) (AE- reacher, sock aid, LH sponge, LH shoe horn)    Recommendations for Other Services  None at this time   Precautions / Restrictions Precautions Precautions: Fall Precaution Comments: No hip precautions - direct anterior approach Restrictions Weight Bearing Restrictions: Yes LLE Weight Bearing: Weight bearing as tolerated    Mobility Bed Mobility - Per PT evaluation Overal bed mobility: Needs Assistance Bed Mobility: Supine to Sit     Supine to sit: Mod assist     General bed mobility comments: Verbal cues for technique.  Patient required assist to bring LLE off of bed, and to raise trunk to sitting position.  Cues to use UE's on bed to push self up (rather than pull on PT).  Once upright, patient with fair sitting balance.  Transfers Overall transfer level: Needs assistance Equipment used: Rolling walker (2 wheeled) Transfers: Sit to/from Stand Sit to Stand: Min assist Stand pivot transfers: Mod assist;+2 safety/equipment       General transfer  comment: Min assist for power up. Cues for hand placement, technique and sequencing during sit>stand movements.     Balance Overall balance assessment: Needs assistance Sitting-balance support: No upper extremity supported;Feet supported Sitting balance-Leahy Scale: Good     Standing balance support: Bilateral upper extremity supported;During functional activity Standing balance-Leahy Scale: Fair    ADL Overall ADL's : Needs assistance/impaired General ADL Comments: Pt unable to reach LEs for LB ADLs. Encouraged patient to work on reaching feet to increase independence with this. Pt min assist for sit<>stand transfers. pt with increased complaints of "lightheadedness" during sit>stands; BP=135/63 in sitting and 139/54 in standing. Encouraged patient to use [facility] for all toileting needs at this time, calling for assistance. During next OT session, plan to practice ambulating <> BR, LB ADLs with or without AE, and discussing shower options.     Pertinent Vitals/Pain Pain Assessment: 0-10 Pain Score: 5  Pain Location: LLE Pain Descriptors / Indicators: Aching;Sore Pain Intervention(s): Limited activity within patient's tolerance;Repositioned;Patient requesting pain meds-RN notified     Hand Dominance Right   Extremity/Trunk Assessment Upper Extremity Assessment Upper Extremity Assessment: Defer to OT evaluation   Lower Extremity Assessment Lower Extremity Assessment: LLE deficits/detail LLE Deficits / Details: Decreased strength and ROM due to pain/surgery LLE: Unable to fully assess due to pain   Cervical / Trunk Assessment Cervical / Trunk Assessment: Normal   Communication Communication Communication: No difficulties   Cognition Arousal/Alertness: Awake/alert Behavior During Therapy: WFL for tasks assessed/performed Overall Cognitive Status: Within Functional Limits for tasks assessed              Home Living Family/patient expects to  be discharged to:: Private  residence Living Arrangements: Alone Available Help at Discharge: Family;Neighbor;Available 24 hours/day (Patient thinks she can have 24 hour assist at d/c) Type of Home: House (Condo) Home Access: Stairs to enter [employer] of Steps: 1 Entrance Stairs-Rails: None Home Layout: One level     Bathroom Shower/Tub: Tub/shower unit;Door   [employer] Toilet: Standard     Home Equipment: Environmental consultant - 2 wheels;Bedside commode   Additional Comments: Pt states she has a RW and her neigthbor has a 3-in-1 she can use      Prior Functioning/Environment Level of Independence: Independent     OT Diagnosis: Generalized weakness;Acute pain   OT Problem List: Decreased strength;Decreased range of motion;Decreased activity tolerance;Impaired balance (sitting and/or standing);Decreased safety awareness;Decreased knowledge of use of DME or AE;Pain   OT Treatment/Interventions: Self-care/ADL training;Therapeutic exercise;Energy conservation;DME and/or AE instruction;Therapeutic activities;Patient/family education;Balance training    OT Goals(Current goals can be found in the care plan section) Acute Rehab OT Goals Patient Stated Goal: To return home OT Goal Formulation: With patient Time For Goal Achievement: 01/05/15 Potential to Achieve Goals: Good ADL Goals Pt Will Perform Grooming: with modified independence;standing Pt Will Perform Lower Body Bathing: with supervision;sit to/from stand;with adaptive equipment Pt Will Perform Lower Body Dressing: with supervision;sit to/from stand;with adaptive equipment Pt Will Transfer to Toilet: with modified independence;ambulating;bedside commode Pt Will Perform Tub/Shower Transfer: Tub transfer;ambulating;3 in 1;rolling walker;with supervision  OT Frequency: Min 2X/week   Barriers to D/C: Decreased caregiver support  Pt reports that she plans to arrange 24/7 supervision/assistance for at least 1 week post acute d/c.    End of Session  Equipment Utilized During Treatment: Rolling walker  Activity Tolerance: Patient tolerated treatment well Patient left: in chair;with call bell/phone within reach   Time: 1450-1513 OT Time Calculation (min): 23 min Charges:  OT General Charges $OT Visit: 1 Procedure OT Evaluation $Initial OT Evaluation Tier I: 1 Procedure OT Treatments $Self Care/Home Management : 8-22 mins  Tinslee Klare , MS, OTR/L, CLT Pager: [phone]  12/29/2014, 3:24 PM

## 2014-12-30 LAB — BASIC METABOLIC PANEL
ANION GAP: 6 (ref 5–15)
BUN: 6 mg/dL (ref 6–20)
CALCIUM: 8 mg/dL — AB (ref 8.9–10.3)
CO2: 27 mmol/L (ref 22–32)
Chloride: 104 mmol/L (ref 101–111)
Creatinine, Ser: 0.64 mg/dL (ref 0.44–1.00)
GFR calc Af Amer: >60 mL/min (ref >60)
GLUCOSE: 125 mg/dL — AB (ref 65–99)
Potassium: 3.9 mmol/L (ref 3.5–5.1)
Sodium: 137 mmol/L (ref 135–145)

## 2014-12-30 LAB — CBC
HEMATOCRIT: 28.6 % — AB (ref 36.0–46.0)
Hemoglobin: 9.5 g/dL — ABNORMAL LOW (ref 12.0–15.0)
MCH: 29.5 pg (ref 26.0–34.0)
MCHC: 33.2 g/dL (ref 30.0–36.0)
MCV: 88.8 fL (ref 78.0–100.0)
Platelets: 190 10*3/uL (ref 150–400)
RBC: 3.22 MIL/uL — AB (ref 3.87–5.11)
RDW: 13.5 % (ref 11.5–15.5)
WBC: 9.7 10*3/uL (ref 4.0–10.5)

## 2014-12-30 MED ORDER — ENOXAPARIN (LOVENOX) PATIENT EDUCATION KIT
PACK | Freq: Once | Status: DC
  Administered 2014-12-30: 14:00:00

## 2014-12-30 MED ORDER — ENOXAPARIN (LOVENOX) PATIENT EDUCATION KIT
PACK | Freq: Once | Status: AC
  Administered 2014-12-30: 14:00:00
  Filled 2014-12-30: qty 1

## 2014-12-30 NOTE — Progress Notes (Signed)
Occupational Therapy Treatment Patient Details Name: Sherri Mendez MRN: [id number] DOB: Jul 14, 1935 Today's Date: 12/30/2014    History of present illness Patient is a 79 yo female admitted 12/27/14 following fall while walking neighbor's dog.  Patient with Lt hip fx, and s/p Lt THA, direct anterior approach.    OT comments  Pt demonstrates improving independence with ADLs.  She is unable to reach lt. Foot and requires assist for LB ADLs - family will assist until she improves.  Pt able to perform bed mobility with min A   Follow Up Recommendations  Home health OT;Supervision/Assistance - 24 hour    Equipment Recommendations  None recommended by OT    Recommendations for Other Services      Precautions / Restrictions Precautions Precautions: Fall Precaution Comments: No hip precautions - direct anterior approach Restrictions Weight Bearing Restrictions: No LLE Weight Bearing: Weight bearing as tolerated       Mobility Bed Mobility Overal bed mobility: Needs Assistance Bed Mobility: Supine to Sit;Sit to Supine     Supine to sit: Min assist Sit to supine: Min guard   General bed mobility comments: Pt instructed how to use sheet or towel as a leg lifter for the Lt. LE.  Using it, she was able to lift Lt lower ext onto bed with min A and out of bed with min guard.  Mod verbal cues provided   Transfers Overall transfer level: Needs assistance Equipment used: Rolling walker (2 wheeled) Transfers: Sit to/from [employer] Sit to Stand: Min guard Stand pivot transfers: Min guard       General transfer comment: UE use from arms on chair, cues to scoot to edge of chair    Balance Overall balance assessment: Needs assistance   Sitting balance-Leahy Scale: Good     Standing balance support: Single extremity supported Standing balance-Leahy Scale: Fair Standing balance comment: needs UE support for ambulation                   ADL Overall ADL's :  Needs assistance/impaired             Lower Body Bathing: Minimal assistance;Sit to/from stand       Lower Body Dressing: Moderate assistance;Sit to/from stand Lower Body Dressing Details (indicate cue type and reason): Pt is unable to don/doff socks.  Able to reach forward to ankle.  Encouraged pt to attempt every day.  SHe is able to lasso Lt foot to pull pants over feet  Toilet Transfer: Min guard;Ambulation;Comfort height toilet;BSC;Grab bars;RW         Tub/Shower Transfer Details (indicate cue type and reason): Pt reports she plans to buy tub transfer bench.  She and daughter were instructed how to safely perform transfer  Functional mobility during ADLs: Min guard;Rolling walker General ADL Comments: Pt was instructed in use of walker bag and options for this, and how to perform simple meal prep at walker level       Vision                     Perception     Praxis      Cognition   Behavior During Therapy: [facility] for tasks assessed/performed Overall Cognitive Status: Within Functional Limits for tasks assessed                       Extremity/Trunk Assessment               Exercises  Total Joint Exercises Ankle Circles/Pumps: AROM;15 reps;Seated;Both Quad Sets: Both;10 reps;Strengthening;Seated Short Arc Quad: AROM;Left;10 reps;Seated Heel Slides: AAROM;Left;10 reps;Seated Hip ABduction/ADduction: AAROM;Left;10 reps;Seated   Shoulder Instructions       General Comments      Pertinent Vitals/ Pain       Pain Assessment: 0-10 Pain Score: 4  Pain Location: Lt hip Pain Descriptors / Indicators: Aching;Cramping;Spasm Pain Intervention(s): Monitored during session  Home Living                                          Prior Functioning/Environment              Frequency Min 2X/week     Progress Toward Goals  OT Goals(current goals can now be found in the care plan section)  Progress towards OT goals:  Progressing toward goals     Plan Discharge plan remains appropriate;Equipment recommendations need to be updated    Co-evaluation                 End of Session Equipment Utilized During Treatment: Rolling walker   Activity Tolerance Patient tolerated treatment well   Patient Left in chair;with call bell/phone within reach;with family/visitor present   Nurse Communication Mobility status        Time: 1312-1350 OT Time Calculation (min): 38 min  Charges: OT General Charges $OT Visit: 1 Procedure OT Treatments $Self Care/Home Management : 23-37 mins $Therapeutic Activity: 8-22 mins  Eyal Greenhaw M 12/30/2014, 2:07 PM

## 2014-12-30 NOTE — Progress Notes (Signed)
Physical Therapy Treatment Patient Details Name: Sherri Mendez MRN: [id number] DOB: 1935-10-20 Today's Date: 12/30/2014    History of Present Illness Patient is a 79 yo female admitted 12/27/14 following fall while walking neighbor's dog.  Patient with Lt hip fx, and s/p Lt THA, direct anterior approach.     PT Comments    Patient mobilizing well today and able to walk in hallway.  Feel safe for d/c home with family assist and HHPT. (states has set up 24 hour assist). Discussed tub bench and where to shop as she reports plans to use tub shower in hall bath with curtain.  Pt has RW and BSC.  Will need HHPT set up prior to d/c home.    Follow Up Recommendations  Home health PT;Supervision/Assistance - 24 hour     Equipment Recommendations  None recommended by PT    Recommendations for Other Services       Precautions / Restrictions Precautions Precautions: Fall Precaution Comments: No hip precautions - direct anterior approach Restrictions Weight Bearing Restrictions: No LLE Weight Bearing: Weight bearing as tolerated    Mobility  Bed Mobility               General bed mobility comments: patient up in chair, reports had some assist today to sit on edge of bed, but eaiser, had trouble last night back to bed after toileting  Transfers Overall transfer level: Needs assistance Equipment used: Rolling walker (2 wheeled) Transfers: Sit to/from Stand Sit to Stand: Min assist         General transfer comment: UE use from arms on chair, cues to scoot to edge of chair  Ambulation/Gait Ambulation/Gait assistance: Min guard Ambulation Distance (Feet): 65 Feet   Gait Pattern/deviations: Step-to pattern;Decreased stride length;Antalgic     General Gait Details: cues for ways to progress left LE due to weakness, pain.   Stairs            Wheelchair Mobility    Modified Rankin (Stroke Patients Only)       Balance Overall balance assessment: Needs  assistance   Sitting balance-Leahy Scale: Good     Standing balance support: Bilateral upper extremity supported Standing balance-Leahy Scale: Fair Standing balance comment: needs UE support for ambulation                    Cognition Arousal/Alertness: Awake/alert Behavior During Therapy: WFL for tasks assessed/performed Overall Cognitive Status: Within Functional Limits for tasks assessed                      Exercises Total Joint Exercises Ankle Circles/Pumps: AROM;15 reps;Seated;Both Quad Sets: Both;10 reps;Strengthening;Seated Short Arc Quad: AROM;Left;10 reps;Seated Heel Slides: AAROM;Left;10 reps;Seated Hip ABduction/ADduction: AAROM;Left;10 reps;Seated    General Comments General comments (skin integrity, edema, etc.): Educated pt in car transfers, one step for home entry (reverse technique) and gave handout for HEP.  Also discussed safety with walker in the home.      Pertinent Vitals/Pain Pain Score: 4  Pain Location: L hip/groin Pain Descriptors / Indicators: Sore Pain Intervention(s): Monitored during session;Ice applied    Home Living                      Prior Function            PT Goals (current goals can now be found in the care plan section) Progress towards PT goals: Progressing toward goals    Frequency  7X/week  PT Plan Current plan remains appropriate    Co-evaluation             End of Session Equipment Utilized During Treatment: Gait belt Activity Tolerance: Patient tolerated treatment well Patient left: in chair;with call bell/phone within reach     Time: 1002-1033 PT Time Calculation (min) (ACUTE ONLY): 31 min  Charges:  $Gait Training: 8-22 mins $Therapeutic Activity: 8-22 mins                    G Codes:      Cobe Viney,CYNDI 13-Jan-2015, 11:20 AM  Magda Kiel, [place] 01-13-2015

## 2014-12-30 NOTE — Progress Notes (Signed)
   Subjective:  Patient reports pain as mild.  No events.  Objective:   VITALS:   Filed Vitals:   12/29/14 1850 12/29/14 2218 12/30/14 0140 12/30/14 0534  BP: 140/49 130/54 138/56 148/61  Pulse: 88 101 102 102  Temp: 98.9 F (37.2 C) 99.5 F (37.5 C) 99.2 F (37.3 C) 99.3 F (37.4 C)  TempSrc: Oral Oral Oral Oral  Resp: '17 18 18 20  '$ Height:      Weight:      SpO2: 96% 92% 92% 94%    Ambulating in halls with walker   Lab Results  Component Value Date   WBC 9.7 12/30/2014   HGB 9.5* 12/30/2014   HCT 28.6* 12/30/2014   MCV 88.8 12/30/2014   PLT 190 12/30/2014     Assessment/Plan:  2 Days Post-Op   - patient stable to dc home from ortho standpoint - f/u 2 weeks  Marianna Payment 12/30/2014, 10:24 AM [phone]

## 2014-12-30 NOTE — Discharge Summary (Addendum)
Physician Discharge Summary  Sherri Mendez RDE:[id number] DOB: 1935-12-18 DOA: 12/27/2014  PCP: Antony Blackbird, MD  Admit date: 12/27/2014 Discharge date: 12/30/2014  Time spent: Greater than 30 minutes  Recommendations for Outpatient Follow-up:  1. Dr. Eduard Roux, Orthopedics in 2 weeks for postop follow-up, suture removal and wound recheck 2. Dr. Antony Blackbird, PCP on 01/06/2015 at 11:15 AM. To be seen with repeat labs (CBC & BMP). 3. [facility] and OT  Discharge Diagnoses:  Principal Problem:   Fracture of femoral neck, left Active Problems:   Leukocytosis   Elevated blood pressure   Discharge Condition: Improved & Stable  Diet recommendation: Heart healthy diet.  Filed Weights   12/27/14 1821 12/28/14 0136  Weight: 70.308 kg (155 lb) 72.1 kg (158 lb 15.2 oz)    History of present illness:  79 y.o. female with no significant PMH, who suffered a mechanical fall at home after being pulled down by neighbors dog that she was trying to walk.She presented with severe left hip pain after fall & inability to ambulate. She was found to have left hip fracture and was transferred from [facility] ED to [facility] as per orthopedic recommendations, for surgery. S/P L THA 12/28/14  Hospital Course:   Left hip femoral neck fracture - Sustained post mechanical fall - S/P left total hip replacement on 6/29 - Patient will be returning home with home health physical therapy. She states that she has arranged for 24/7 assistance and supervision at home between her friends and family. - Discussed with orthopedics who have cleared her for discharge home. As per orthopedic recommendations, weightbearing as tolerated, no dressing change until outpatient follow-up with them, Lovenox for DVT prophylaxis 2 weeks, pain management. Patient/family will be educated by nursing to administer Lovenox. - Pain improved and patient ambulating with assistance - Foley catheter has been removed and  patient is voiding.  Leukocytosis - Likely stress response. Resolved  Elevated blood pressure - Possibly from pain response.  - Controlled/resolved  Hypokalemia - Replaced. Magnesium normal  Expected postop acute blood loss anemia - Hemoglobin has gradually dropped from 13.8 preop to 9.5. Follow-up CBC in a few days as outpatient.   Consultants:  Orthopedics  Procedures:  12/28/14: Total hip replacement; Left hip; uncemented [id number]   Foley catheter-DC'd   Discharge Exam:  Complaints:  Left operated site hip pain improving. Rated as mild this morning. No other complaints reported.  Filed Vitals:   12/29/14 1850 12/29/14 2218 12/30/14 0140 12/30/14 0534  BP: 140/49 130/54 138/56 148/61  Pulse: 88 101 102 102  Temp: 98.9 F (37.2 C) 99.5 F (37.5 C) 99.2 F (37.3 C) 99.3 F (37.4 C)  TempSrc: Oral Oral Oral Oral  Resp: '17 18 18 20  '$ Height:      Weight:      SpO2: 96% 92% 92% 94%    General exam: Pleasant elderly female seen sitting up comfortably in chair this morning. Respiratory system: Clear. No increased work of breathing. Cardiovascular system: S1 & S2 heard, RRR. No JVD, murmurs, gallops, clicks or pedal edema. Gastrointestinal system: Abdomen is nondistended, soft and nontender. Normal bowel sounds heard. Central nervous system: Alert and oriented. No focal neurological deficits. Extremities: Symmetric 5 x 5 power except left lower extremity where pain assessment is limited secondary to pain. Surgical site left hip dressing clean and dry.  Discharge Instructions      Discharge Instructions    Call MD for:  difficulty breathing, headache or  visual disturbances    Complete by:  As directed      Call MD for:  extreme fatigue    Complete by:  As directed      Call MD for:  persistant dizziness or light-headedness    Complete by:  As directed      Call MD for:  persistant nausea and vomiting    Complete by:  As directed      Call MD for:  redness,  tenderness, or signs of infection (pain, swelling, redness, odor or green/yellow discharge around incision site)    Complete by:  As directed      Call MD for:  severe uncontrolled pain    Complete by:  As directed      Call MD for:  temperature >100.4    Complete by:  As directed      Diet - low sodium heart healthy    Complete by:  As directed      Increase activity slowly    Complete by:  As directed      Weight bearing as tolerated    Complete by:  As directed             Medication List    TAKE these medications        enoxaparin 40 MG/0.4ML injection  Commonly known as:  LOVENOX  Inject 0.4 mLs (40 mg total) into the skin daily.     multivitamin with minerals tablet  Take 1 tablet by mouth daily.     oxyCODONE 5 MG immediate release tablet  Commonly known as:  Oxy IR/ROXICODONE  Take 1-3 tablets (5-15 mg total) by mouth every 4 (four) hours as needed.       Follow-up Information    Follow up with Marianna Payment, MD In 2 weeks.   Specialty:  Orthopedic Surgery   Why:  For suture removal, For wound re-check   Contact information:   [place] [phone]       Follow up with Antony Blackbird, MD.   Specialty:  Family Medicine   Why:  Appointment January 06, 2015 at 11:15 bring ID and insurance card. To be seen with repeat labs (CBC & BMP).   Contact information:   [address] [address] [phone]       Follow up with [facility].   Why:  Home health PT and OT    Contact information:   [address] [place]  [address] [phone]        The results of significant diagnostics from this hospitalization (including imaging, microbiology, ancillary and laboratory) are listed below for reference.    Significant Diagnostic Studies: Pelvis Portable  12/28/2014   CLINICAL DATA:  Status post left hip replacement.  EXAM: PORTABLE PELVIS 1-2 VIEWS  COMPARISON:  None.  FINDINGS:  Left hip prosthetic components are well-seated and aligned on this single view. There is no acute fracture or evidence of an operative complication.  IMPRESSION: Well aligned left hip prosthesis.   Electronically Signed   By: Lajean Manes M.D.   On: 12/28/2014 17:06   Dg Hip Operative Unilat With Pelvis Left  12/28/2014   CLINICAL DATA:  LEFT hip replacement anterior approach  EXAM: OPERATIVE LEFT HIP (WITH PELVIS IF PERFORMED) 2 VIEWS  TECHNIQUE: Fluoroscopic spot image(s) were submitted for interpretation post-operatively.  FLUOROSCOPY TIME:  Radiation Exposure Index (as provided by the fluoroscopic device): Not provided  If the  device does not provide the exposure index:  Fluoroscopy Time:  0 minutes 34 second  Number of Acquired Images:  2  COMPARISON:  12/27/2014 preoperative exam  FINDINGS: Mild osseous demineralization.  LEFT hip prosthesis identified in expected position.  No fracture or dislocation.  Visualized LEFT pelvis intact.  IMPRESSION: LEFT hip prosthesis without acute complication.   Electronically Signed   By: Lavonia Dana M.D.   On: 12/28/2014 16:01   Dg Hip Unilat With Pelvis 2-3 Views Left  12/27/2014   CLINICAL DATA:  Left femur pain after fall.  EXAM: LEFT HIP (WITH PELVIS) 2-3 VIEWS  COMPARISON:  None.  FINDINGS: Malalignment of the femoral neck and head is highly suspicious for a subcapital femoral neck fracture.  Degenerative arthropathy of both hips, right greater than left.  Scattered vascular calcifications in the anatomic pelvis.  IMPRESSION: 1. Subcapital left femoral neck fracture 2. Degenerative arthropathy of both hips, right greater than left.   Electronically Signed   By: Van Clines M.D.   On: 12/27/2014 19:58   Dg Femur Min 2 Views Left  12/27/2014   CLINICAL DATA:  Pain following fall wall walking dog  EXAM: LEFT FEMUR 2 VIEWS  COMPARISON:  None.  FINDINGS: Frontal and lateral views were obtained. There is an impacted subcapital femoral neck fracture. No other  fracture. No dislocation. There is moderate narrowing in the hip and knee joint regions. No knee joint effusion.  IMPRESSION: Evidence of an impacted subcapital femoral neck fracture. No dislocation. Osteoarthritic change in the knee and hip joints.   Electronically Signed   By: Lowella Grip III M.D.   On: 12/27/2014 19:59    Microbiology: Recent Results (from the past 240 hour(s))  Surgical pcr screen     Status: None   Collection Time: 12/28/14 10:33 AM  Result Value Ref Range Status   MRSA, PCR NEGATIVE NEGATIVE Final   Staphylococcus aureus NEGATIVE NEGATIVE Final    Comment:        The Xpert SA Assay (FDA approved for NASAL specimens in patients over 9 years of age), is one component of a comprehensive surveillance program.  Test performance has been validated by [facility] for patients greater than or equal to 60 year old. It is not intended to diagnose infection nor to guide or monitor treatment.      Labs: Basic Metabolic Panel:  Recent Labs Lab 12/27/14 2119 12/28/14 1953 12/29/14 0314 12/30/14 0325  NA 139  --  135 137  K 3.9  --  3.4* 3.9  CL 103  --  102 104  CO2 26  --  26 27  GLUCOSE 115*  --  128* 125*  BUN 19  --  10 6  CREATININE 0.74 0.76 0.71 0.64  CALCIUM 9.3  --  7.5* 8.0*  MG  --   --  2.0  --    Liver Function Tests: No results for input(s): AST, ALT, ALKPHOS, BILITOT, PROT, ALBUMIN in the last 168 hours. No results for input(s): LIPASE, AMYLASE in the last 168 hours. No results for input(s): AMMONIA in the last 168 hours. CBC:  Recent Labs Lab 12/27/14 2041 12/27/14 2119 12/28/14 1953 12/29/14 0314 12/30/14 0325  WBC 17.2* 16.6* 15.4* 9.1 9.7  NEUTROABS 15.6* 15.2*  --   --   --   HGB 14.6 13.8 11.5* 10.4* 9.5*  HCT 43.7 40.1 34.0* 30.6* 28.6*  MCV 87.9 87.2 88.3 88.7 88.8  PLT 259 243 223 195 190  Cardiac Enzymes: No results for input(s): CKTOTAL, CKMB, CKMBINDEX, TROPONINI in the last 168 hours. BNP: BNP (last 3  results) No results for input(s): BNP in the last 8760 hours.  ProBNP (last 3 results) No results for input(s): PROBNP in the last 8760 hours.  CBG: No results for input(s): GLUCAP in the last 168 hours.    Signed:  Vernell Leep, MD, FACP, FHM. [facility] Pager [phone]  If 7PM-7AM, please contact night-coverage [email or web address] Password [email or web address] 12/30/2014, 12:58 PM

## 2014-12-30 NOTE — Care Management Note (Signed)
Case Management Note  Patient Details  Name: Sherri Mendez MRN: [id number] Date of Birth: [birth year]  Subjective/Objective:                    Action/Plan: Patient states she has discussed 24 hour supervision with daughter and friends and does have people available 90 /7.  Consult for follow up appointment with PCP . Patient stated she has just changed PCP's to Dr Antony Blackbird at [facility] at [facility] [phone].   Spoke to [place] at [facility] with Dr Chapman Fitch . Patient first available appointment is January 06, 2015 Friday at 11 15 am . Patient needs to bring insurance card and ID and have records from previous PCP sent to Dr Siri Cole before her appointment.    Patient voices understanding.   Expected Discharge Date:   (unknown)               Expected Discharge Plan:  [place]  In-House Referral:     Discharge planning Services  CM Consult  Post Acute Care Choice:  Home Health Choice offered to:  Patient  DME Arranged:    DME Agency:     HH Arranged:  PT [place]:  [facility]  Status of Service:  In process, will continue to follow  Medicare Important Message Given:    Date Medicare IM Given:    Medicare IM give by:    Date Additional Medicare IM Given:    Additional Medicare Important Message give by:     If discussed at [place] of Stay Meetings, dates discussed:    Additional Comments:  Dannah, Ryles, RN 12/30/2014, 9:44 AM

## 2014-12-30 NOTE — Progress Notes (Signed)
Orthopedic Tech Progress Note Patient Details:  Sherri Mendez 10/28/1935 [id number]  Patient ID: Sherri Mendez, female   DOB: 1936-06-18, 79 y.o.   MRN: [id number] Pt unable to use trapeze bar patient helper  Hildred Priest 12/30/2014, 1:06 PM

## 2014-12-30 NOTE — Discharge Instructions (Signed)
1. Change dressings as needed 2. May shower but keep incisions covered and dry 3. Take lovenox to prevent blood clots 4. Take stool softeners as needed 5. Take pain meds as needed  Hip Fracture A hip fracture is a fracture of the upper part of your thigh bone (femur).  CAUSES A hip fracture is caused by a direct blow to the side of your hip. This is usually the result of a fall but can occur in other circumstances, such as an automobile accident. RISK FACTORS There is an increased risk of hip fractures in people with:  An unsteady walking pattern (gait) and those with conditions that contribute to poor balance, such as Parkinson's disease or dementia.  Osteopenia and osteoporosis.  Cancer that spreads to the leg bones.  Certain metabolic diseases. SYMPTOMS  Symptoms of hip fracture include:  Pain over the injured hip.  Inability to put weight on the leg in which the fracture occurred (although, some patients are able to walk after a hip fracture).  Toes and foot of the affected leg point outward when you lie down. DIAGNOSIS A physical exam can determine if a hip fracture is likely to have occurred. X-ray exams are needed to confirm the fracture and to look for other injuries. The X-ray exam can help to determine the type of hip fracture. Rarely, the fracture is not visible on an X-ray image and a CT scan or MRI will have to be done. TREATMENT  The treatment for a fracture is usually surgery. This means using a screw, nail, or rod to hold the bones in place.  HOME CARE INSTRUCTIONS Take all medicines as directed by your health care provider. SEEK MEDICAL CARE IF: Pain continues, even after taking pain medicine. MAKE SURE YOU:  Understand these instructions.   Will watch your condition.  Will get help right away if you are not doing well or get worse. Document Released: 06/17/2005 Document Revised: 06/22/2013 Document Reviewed: 01/27/2013 [facility] Patient Information 2015  [place], [place]. This information is not intended to replace advice given to you by your health care provider. Make sure you discuss any questions you have with your health care provider.

## 2014-12-31 DIAGNOSIS — Z471 Aftercare following joint replacement surgery: Secondary | ICD-10-CM | POA: Diagnosis not present

## 2014-12-31 DIAGNOSIS — Z9181 History of falling: Secondary | ICD-10-CM | POA: Diagnosis not present

## 2014-12-31 DIAGNOSIS — Z96642 Presence of left artificial hip joint: Secondary | ICD-10-CM | POA: Diagnosis not present

## 2015-01-02 DIAGNOSIS — Z9181 History of falling: Secondary | ICD-10-CM | POA: Diagnosis not present

## 2015-01-02 DIAGNOSIS — Z471 Aftercare following joint replacement surgery: Secondary | ICD-10-CM | POA: Diagnosis not present

## 2015-01-02 DIAGNOSIS — Z96642 Presence of left artificial hip joint: Secondary | ICD-10-CM | POA: Diagnosis not present

## 2015-01-04 DIAGNOSIS — Z96642 Presence of left artificial hip joint: Secondary | ICD-10-CM | POA: Diagnosis not present

## 2015-01-04 DIAGNOSIS — Z9181 History of falling: Secondary | ICD-10-CM | POA: Diagnosis not present

## 2015-01-04 DIAGNOSIS — Z471 Aftercare following joint replacement surgery: Secondary | ICD-10-CM | POA: Diagnosis not present

## 2015-01-06 DIAGNOSIS — R739 Hyperglycemia, unspecified: Secondary | ICD-10-CM | POA: Diagnosis not present

## 2015-01-06 DIAGNOSIS — D72829 Elevated white blood cell count, unspecified: Secondary | ICD-10-CM | POA: Diagnosis not present

## 2015-01-06 DIAGNOSIS — Z8781 Personal history of (healed) traumatic fracture: Secondary | ICD-10-CM | POA: Diagnosis not present

## 2015-01-06 DIAGNOSIS — I10 Essential (primary) hypertension: Secondary | ICD-10-CM | POA: Diagnosis not present

## 2015-01-06 DIAGNOSIS — Z9181 History of falling: Secondary | ICD-10-CM | POA: Diagnosis not present

## 2015-01-06 DIAGNOSIS — R8299 Other abnormal findings in urine: Secondary | ICD-10-CM | POA: Diagnosis not present

## 2015-01-06 DIAGNOSIS — E876 Hypokalemia: Secondary | ICD-10-CM | POA: Diagnosis not present

## 2015-01-06 DIAGNOSIS — Z96642 Presence of left artificial hip joint: Secondary | ICD-10-CM | POA: Diagnosis not present

## 2015-01-06 DIAGNOSIS — Z471 Aftercare following joint replacement surgery: Secondary | ICD-10-CM | POA: Diagnosis not present

## 2015-01-09 DIAGNOSIS — Z9181 History of falling: Secondary | ICD-10-CM | POA: Diagnosis not present

## 2015-01-09 DIAGNOSIS — Z471 Aftercare following joint replacement surgery: Secondary | ICD-10-CM | POA: Diagnosis not present

## 2015-01-09 DIAGNOSIS — Z96642 Presence of left artificial hip joint: Secondary | ICD-10-CM | POA: Diagnosis not present

## 2015-01-10 DIAGNOSIS — S72042D Displaced fracture of base of neck of left femur, subsequent encounter for closed fracture with routine healing: Secondary | ICD-10-CM | POA: Diagnosis not present

## 2015-01-11 DIAGNOSIS — Z9181 History of falling: Secondary | ICD-10-CM | POA: Diagnosis not present

## 2015-01-11 DIAGNOSIS — Z471 Aftercare following joint replacement surgery: Secondary | ICD-10-CM | POA: Diagnosis not present

## 2015-01-11 DIAGNOSIS — Z96642 Presence of left artificial hip joint: Secondary | ICD-10-CM | POA: Diagnosis not present

## 2015-01-13 DIAGNOSIS — Z96642 Presence of left artificial hip joint: Secondary | ICD-10-CM | POA: Diagnosis not present

## 2015-01-13 DIAGNOSIS — Z471 Aftercare following joint replacement surgery: Secondary | ICD-10-CM | POA: Diagnosis not present

## 2015-01-13 DIAGNOSIS — Z9181 History of falling: Secondary | ICD-10-CM | POA: Diagnosis not present

## 2015-01-30 ENCOUNTER — Ambulatory Visit: Payer: Commercial Managed Care - HMO | Attending: Family Medicine

## 2015-01-30 DIAGNOSIS — R29898 Other symptoms and signs involving the musculoskeletal system: Secondary | ICD-10-CM | POA: Diagnosis not present

## 2015-01-30 DIAGNOSIS — R269 Unspecified abnormalities of gait and mobility: Secondary | ICD-10-CM | POA: Insufficient documentation

## 2015-01-30 NOTE — Therapy (Signed)
[facility] [address], [place], [place], [address] Phone: [phone]   Fax:  [phone]  Physical Therapy Evaluation  Patient Details  Name: Sherri Mendez MRN: [id number] Date of Birth: Jul 14, 1935 Referring Provider:  Antony Blackbird, MD  Encounter Date: 01/30/2015      PT End of Session - 01/30/15 1312    Visit Number 1   Number of Visits 10  Medicare   Date for PT Re-Evaluation 03/27/15   PT Start Time 6962   PT Stop Time 1315   PT Time Calculation (min) 30 min   Activity Tolerance Patient tolerated treatment well   Behavior During Therapy [facility] for tasks assessed/performed      History reviewed. No pertinent past medical history.  Past Surgical History  Procedure Laterality Date  . Total hip arthroplasty Left 12/28/2014    Procedure: TOTAL HIP ARTHROPLASTY ANTERIOR APPROACH;  Surgeon: Leandrew Koyanagi, MD;  Location: [place];  Service: Orthopedics;  Laterality: Left;    There were no vitals filed for this visit.  Visit Diagnosis:  Weakness of left hip - Plan: PT plan of care cert/re-cert  Abnormality of gait - Plan: PT plan of care cert/re-cert      Subjective Assessment - 01/30/15 1247    Subjective Pt presents to PT s/p Lt THA performed s/p Lt hip fracture.  Fracture as 6/28 and surgery 12/28/14.     Patient is accompained by: Family member   Pertinent History Pt was walking neighbor's dog and dog pulled pt down and she sustained fall    Patient Stated Goals improve Lt hip strength, wean from device   Currently in Pain? Yes   Pain Score 0-No pain   Pain Location Hip   Pain Orientation Left   Pain Descriptors / Indicators Tightness   Pain Onset More than a month ago   Pain Frequency Intermittent   Aggravating Factors  exercises   Pain Relieving Factors medication, rest   Multiple Pain Sites No            [facility] PT Assessment - 01/30/15 0001    Assessment   Medical Diagnosis s/p Lt hip fracture  (Z87.81)   Onset Date/Surgical Date 12/27/14   Next MD Visit 02/09/15   Prior Therapy home health PT-ended 2 weeks ago   Precautions   Precautions None   Balance Screen   Has the patient fallen in the past 6 months Yes   How many times? 1  dog pulled over   Has the patient had a decrease in activity level because of a fear of falling?  No   Is the patient reluctant to leave their home because of a fear of falling?  No   Home Social worker Private residence   [place] to enter   Entrance Stairs-Number of Steps [place] One level   [place] - 2 wheels;Cane - single point;Shower seat;Bedside commode   Prior Function   Level of Independence Independent   Vocation Retired   [employer] on [employer], plays cards   Cognition   Overall Cognitive Status Within Functional Limits for tasks assessed   Observation/Other Assessments   Focus on Therapeutic Outcomes (FOTO)  50% limitation   Posture/Postural Control   Posture/Postural Control No significant limitations   ROM / Strength   AROM / PROM / Strength AROM;Strength   AROM   Overall AROM  Deficits  Overall AROM Comments flexibility limited by 25% in Lt hip in all directions   Strength   Overall Strength Deficits   Overall Strength Comments Lt knee extension 4+/5, flexion 4+/5, hip flexion 4/5, extension 4-/5, abduction 4/5.  Rt hip and knee 4+/5 throughout.    Palpation   Palpation comment well healing surgical incision    Transfers   Transfers Sit to Stand   Sit to Stand 6: Modified independent (Device/Increase time)   Ambulation/Gait   Ambulation/Gait Yes   Ambulation/Gait Assistance 6: Modified independent (Device/Increase time)   Ambulation Distance (Feet) 100 Feet   Assistive device Straight cane   Gait Pattern Step-through pattern;Decreased stride length;Decreased stance time - left;Decreased weight shift to left                            PT Education - 01/30/15 1305    Education provided Yes   Education Details HEP: standing hip kicks, long arc quads   Person(s) Educated Patient;Child(ren)   Methods Explanation;Demonstration;Handout   Comprehension Verbalized understanding          PT Short Term Goals - 01/30/15 1316    PT SHORT TERM GOAL #1   Title be independent in initial HEP   Time 4   Period Weeks   Status New   PT SHORT TERM GOAL #2   Title wean from walker to cane for all distances   Time 4   Period Weeks   Status New   PT SHORT TERM GOAL #3   Title demonstrate symmetry with ambulation on level surface with cane   Time 4   Period Weeks   Status New           PT Long Term Goals - 01/30/15 1240    PT LONG TERM GOAL #1   Title be independent in advanced HEP   Time 8   Period Weeks   Status New   PT LONG TERM GOAL #2   Title reduce FOTO to < or = to 41% limitation   Time 8   Period Weeks   Status New   PT LONG TERM GOAL #3   Title wean from cane > 75% of the time in the community with safety   Time 8   Period Weeks   Status New   PT LONG TERM GOAL #4   Title perform community ambulation for > or = to 1 hour without need for rest   Time 8   Period Weeks   Status New   PT LONG TERM GOAL #5   Title demonstrate 4+/5 Lt hip strength to improve endurance and safety with gait   Time 8   Period Weeks   Status New               Plan - 01/30/15 1313    Clinical Impression Statement Pt presents to PT s/p Lt THA secondary to fall 12/27/14 when pulled down by a dog.  Pt had home health PT until 2 weeks.  Pt demonstrates gait abnormality and Lt hip weakness.  FOTO score is 50% limitation . Pt will benefit from skilled PT to improve Lt LE strength to wean from cane/walker to no device and return to prior level of community function.     Pt will benefit from skilled therapeutic intervention in order to improve on the following deficits Abnormal  gait;Decreased range of motion;Pain;Decreased strength   Rehab Potential Good   PT Frequency 2x /  week   PT Duration 8 weeks   PT Treatment/Interventions ADLs/Self Care Home Management;Cryotherapy;Electrical Stimulation;Functional mobility training;Stair training;Gait training;Ultrasound;Moist Heat;Therapeutic activities;Therapeutic exercise;Neuromuscular re-education;Patient/family education;Passive range of motion;Scar mobilization;Manual techniques   PT Next Visit Plan Lt hip strength, build endurance, gait training with cane   Consulted and Agree with Plan of Care Patient;Family member/caregiver   Family Member Consulted Pt's daughter          G-Codes - 02/14/2015 1240    Functional Assessment Tool Used FOTO: 50% limitation   Functional Limitation Mobility: Walking and moving around   Mobility: Walking and Moving Around Current Status [phone]) At least 40 percent but less than 60 percent impaired, limited or restricted   Mobility: Walking and Moving Around Goal Status [phone]) At least 40 percent but less than 60 percent impaired, limited or restricted       Problem List Patient Active Problem List   Diagnosis Date Noted  . Fracture of femoral neck, left 12/28/2014  . Leukocytosis   . Elevated blood pressure     Belicia Difatta, PT 02/14/2015, 1:20 PM  [facility] [address], [place], [place], [address] Phone: [phone]   Fax:  [phone]

## 2015-01-30 NOTE — Patient Instructions (Addendum)
BOTH LEGS  Knee High   Holding stable object, raise knee to hip level, then lower knee. Repeat with other knee. Complete _2x_10_ repetitions. Do __2__ sessions per day.  EXTENSION: Standing (Active)  Stand, both feet flat. Draw right leg behind body as far as possible. Use 0___ lbs. Complete 2x10 repetitions. Perform __2_ sessions per day.  Copyright  VHI. All rights reserved.  ABDUCTION: Standing (Power)   Stand, feet flat. Lift right leg out to side as quickly as possible. Use ___ lbs. Complete _2__ sets of _10__ repetitions. Perform _2__ sessions per day.  Copyright  VHI. All rights reserved.   KNEE: Extension, Long Arc Quads - Sitting   Raise leg until knee is straight.  Hold 5 seconds _10__ reps per set, __5_ sets per day, ___ days per week  Copyright  VHI. All rights reserved.   [facility] [address] [place], [place] [address] Phone # [phone] Fax [phone]

## 2015-01-30 NOTE — Addendum Note (Signed)
Addended by: Danie Binder on: 01/30/2015 03:30 PM   Modules accepted: Orders

## 2015-02-06 ENCOUNTER — Ambulatory Visit: Payer: Commercial Managed Care - HMO | Admitting: Physical Therapy

## 2015-02-06 ENCOUNTER — Encounter: Payer: Self-pay | Admitting: Physical Therapy

## 2015-02-06 DIAGNOSIS — R269 Unspecified abnormalities of gait and mobility: Secondary | ICD-10-CM | POA: Diagnosis not present

## 2015-02-06 DIAGNOSIS — R29898 Other symptoms and signs involving the musculoskeletal system: Secondary | ICD-10-CM

## 2015-02-06 NOTE — Patient Instructions (Signed)
Hamstring Stretch   Inhale and straighten spine. Exhale and lean forward toward extended leg. Hold position for 3___ breaths. Inhale and come back to center.  Repeat _3__ times, alternating legs. Do _3__ times per day. Sit on stable surface like chair or sofa  Copyright  VHI. All rights reserved.  Hamstring Step 2   Left foot relaxed, knee straight, other leg bent, foot flat. Raise straight leg further upward to maximal range. Hold ___ seconds. Relax leg completely down. Repeat ___ times.  Copyright  VHI. All rights reserved.  HIP: Flexors - Supine   Lie on edge of surface. Place leg off the surface, allow knee to bend. Bring other knee toward chest. Hold ___ seconds. ___ reps per set, ___ sets per day, ___ days per week Rest lowered foot on stool.  Copyright  VHI. All rights reserved.  Flexors, Supine Bridge   Lie supine, feet shoulder-width apart. Lift hips toward ceiling. Hold ___ seconds. Repeat ___ times per session. Do ___ sessions per day.  Copyright  VHI. All rights reserved.

## 2015-02-06 NOTE — Therapy (Signed)
[facility] [address] [place], [place], [address] Phone: [phone]   Fax:  [phone]  Physical Therapy Treatment  Patient Details  Name: Sherri Mendez MRN: [id number] Date of Birth: 1935/09/28 Referring Provider:  Antony Blackbird, MD  Encounter Date: 02/06/2015      PT End of Session - 02/06/15 1553    Visit Number 2   Number of Visits 10   Date for PT Re-Evaluation 03/27/15   PT Start Time 8921   PT Stop Time 1610   PT Time Calculation (min) 40 min   Activity Tolerance Patient tolerated treatment well   Behavior During Therapy [facility] for tasks assessed/performed      History reviewed. No pertinent past medical history.  Past Surgical History  Procedure Laterality Date  . Total hip arthroplasty Left 12/28/2014    Procedure: TOTAL HIP ARTHROPLASTY ANTERIOR APPROACH;  Surgeon: Leandrew Koyanagi, MD;  Location: [place];  Service: Orthopedics;  Laterality: Left;    There were no vitals filed for this visit.  Visit Diagnosis:  Weakness of left hip  Abnormality of gait      Subjective Assessment - 02/06/15 1525    Subjective Did alot today, hip just feels tight. Ambulating with cane today, only using for community distances.   Currently in Pain? No/denies   Multiple Pain Sites No                         [facility] Adult PT Treatment/Exercise - 02/06/15 0001    Knee/Hip Exercises: Stretches   Active Hamstring Stretch Left;2 reps;20 seconds   Hip Flexor Stretch Left;2 reps;20 seconds   Gastroc Stretch Left;2 reps;20 seconds   Knee/Hip Exercises: Aerobic   Stationary Bike L1 x 5 min   Nustep Seat #5 5 min L1   Knee/Hip Exercises: Standing   Hip Flexion Stengthening;Both;10 reps   Hip Abduction Stengthening;Both;10 reps   Hip Extension Stengthening;Both;10 reps   Knee/Hip Exercises: Seated   Long Arc Quad Strengthening;Left;1 set;10 reps   Knee/Hip Exercises: Supine   Bridges  Strengthening;Both;2 sets;5 reps                PT Education - 02/06/15 1533    Education provided Yes   Education Details Scar massage, HEP inc hamstring, gastroc stretch, bridge, hip flexor stretch, cane long educated on proper length   Person(s) Educated --   Methods Explanation;Demonstration   Comprehension Verbalized understanding          PT Short Term Goals - 02/06/15 1612    PT SHORT TERM GOAL #1   Title be independent in initial HEP   Time 4   Period Weeks   Status Achieved           PT Long Term Goals - 02/06/15 1544    PT LONG TERM GOAL #3   Title wean from cane > 75% of the time in the community with safety   Time 8   Period Weeks   Status Achieved               Plan - 02/06/15 1608    Clinical Impression Statement Pt did excellent today in clinic, ambulated in with cane which was slightly too long. Pt walked with proper gait sequence and no limp without cane so she probably won't need cane for too long. Advanced HEP today to include stretching and bridging. Met many STGs today.   Pt will benefit from skilled  therapeutic intervention in order to improve on the following deficits Abnormal gait;Decreased range of motion;Pain;Decreased strength   Rehab Potential Good   PT Frequency 2x / week   PT Duration 8 weeks   PT Treatment/Interventions ADLs/Self Care Home Management;Cryotherapy;Electrical Stimulation;Functional mobility training;Stair training;Gait training;Ultrasound;Moist Heat;Therapeutic activities;Therapeutic exercise;Neuromuscular re-education;Patient/family education;Passive range of motion;Scar mobilization;Manual techniques   PT Next Visit Plan Nustep, Bike, review stretches, hip strength   Consulted and Agree with Plan of Care Patient        Problem List Patient Active Problem List   Diagnosis Date Noted  . Fracture of femoral neck, left 12/28/2014  . Leukocytosis   . Elevated blood pressure     Kaitlynne Wenz,  PTA 02/06/2015, 4:13 PM  [facility] [address] [place], [place], [address] Phone: [phone]   Fax:  [phone]

## 2015-02-09 ENCOUNTER — Encounter: Payer: Self-pay | Admitting: Physical Therapy

## 2015-02-09 ENCOUNTER — Ambulatory Visit: Payer: Commercial Managed Care - HMO | Admitting: Physical Therapy

## 2015-02-09 DIAGNOSIS — R269 Unspecified abnormalities of gait and mobility: Secondary | ICD-10-CM

## 2015-02-09 DIAGNOSIS — S72042D Displaced fracture of base of neck of left femur, subsequent encounter for closed fracture with routine healing: Secondary | ICD-10-CM | POA: Diagnosis not present

## 2015-02-09 DIAGNOSIS — R29898 Other symptoms and signs involving the musculoskeletal system: Secondary | ICD-10-CM

## 2015-02-09 NOTE — Therapy (Signed)
[facility] [address] [place], [place], [address] Phone: [phone]   Fax:  [phone]  Physical Therapy Treatment  Patient Details  Name: Sherri Mendez MRN: [id number] Date of Birth: 06-Aug-1935 Referring Provider:  Antony Blackbird, MD  Encounter Date: 02/09/2015      PT End of Session - 02/09/15 1539    Visit Number 3   Number of Visits 10   Date for PT Re-Evaluation 03/27/15   PT Start Time 0932   PT Stop Time 1531   PT Time Calculation (min) 46 min   Activity Tolerance Patient tolerated treatment well   Behavior During Therapy [facility] for tasks assessed/performed      History reviewed. No pertinent past medical history.  Past Surgical History  Procedure Laterality Date  . Total hip arthroplasty Left 12/28/2014    Procedure: TOTAL HIP ARTHROPLASTY ANTERIOR APPROACH;  Surgeon: Leandrew Koyanagi, MD;  Location: [facility];  Service: Orthopedics;  Laterality: Left;    There were no vitals filed for this visit.  Visit Diagnosis:  Weakness of left hip  Abnormality of gait      Subjective Assessment - 02/09/15 1528    Subjective Feeling good not using the cane at all. Pt saw MD today and he is pleased with progress.   Currently in Pain? No/denies                         [facility] Adult PT Treatment/Exercise - 02/09/15 0001    Knee/Hip Exercises: Stretches   Active Hamstring Stretch Left;3 reps;20 seconds  in supine with strap   Gastroc Stretch 20 seconds;3 reps;Both  in standing   Knee/Hip Exercises: Aerobic   Stationary Bike L1 x 6 min   Nustep Seat #6 7 min L1   Knee/Hip Exercises: Machines for Strengthening   Total Gym Leg Press B LE 70# 2x10   Knee/Hip Exercises: Standing   Hip Flexion Stengthening;Both;20 reps   Knee/Hip Exercises: Seated   Long Arc Quad Strengthening;Left;1 set;10 reps   Knee/Hip Exercises: Supine   Bridges Strengthening;2 sets;5 reps   Knee/Hip Exercises: Sidelying   Hip ABduction 2 sets;10 reps;Strengthening  sidelying Rt for Lt hip ABD   Knee/Hip Exercises: Prone   Hip Extension Strengthening;2 sets;10 reps  with buttocks squezzes                  PT Short Term Goals - 02/06/15 1612    PT SHORT TERM GOAL #1   Title be independent in initial HEP   Time 4   Period Weeks   Status Achieved           PT Long Term Goals - 02/06/15 1544    PT LONG TERM GOAL #3   Title wean from cane > 75% of the time in the community with safety   Time 8   Period Weeks   Status Achieved               Plan - 02/09/15 1539    Clinical Impression Statement Pt with good performance with stretching and strengthening, Pt with fatique at end of sesssion.    Pt will benefit from skilled therapeutic intervention in order to improve on the following deficits Abnormal gait;Decreased range of motion;Pain;Decreased strength   Rehab Potential Good   PT Frequency 2x / week   PT Duration 8 weeks   PT Treatment/Interventions ADLs/Self Care Home Management;Cryotherapy;Electrical Stimulation;Functional mobility training;Stair training;Gait training;Ultrasound;Moist Heat;Therapeutic activities;Therapeutic  exercise;Neuromuscular re-education;Patient/family education;Passive range of motion;Scar mobilization;Manual techniques   PT Next Visit Plan Nustep, Bike, review stretches, hip strength   Consulted and Agree with Plan of Care Patient        Problem List Patient Active Problem List   Diagnosis Date Noted  . Fracture of femoral neck, left 12/28/2014  . Leukocytosis   . Elevated blood pressure     NAUMANN-HOUEGNIFIO,Devanny Palecek PTA 02/09/2015, 3:42 PM  [facility] [address], [place], [place], [address] Phone: [phone]   Fax:  [phone]

## 2015-02-13 ENCOUNTER — Encounter: Payer: Self-pay | Admitting: Physical Therapy

## 2015-02-13 ENCOUNTER — Ambulatory Visit: Payer: Commercial Managed Care - HMO | Admitting: Physical Therapy

## 2015-02-13 DIAGNOSIS — R269 Unspecified abnormalities of gait and mobility: Secondary | ICD-10-CM

## 2015-02-13 DIAGNOSIS — R29898 Other symptoms and signs involving the musculoskeletal system: Secondary | ICD-10-CM | POA: Diagnosis not present

## 2015-02-13 NOTE — Therapy (Signed)
[facility] [address], [place], [place], [address] Phone: [phone]   Fax:  [phone]  Physical Therapy Treatment  Patient Details  Name: Sherri Mendez MRN: [id number] Date of Birth: [birth year] Referring Provider:  Antony Blackbird, MD  Encounter Date: 02/13/2015      PT End of Session - 02/13/15 1206    Visit Number 4   Number of Visits 10   Date for PT Re-Evaluation 03/27/15   PT Start Time 7026   PT Stop Time 1230   PT Time Calculation (min) 42 min   Activity Tolerance Patient tolerated treatment well   Behavior During Therapy [facility] for tasks assessed/performed      History reviewed. No pertinent past medical history.  Past Surgical History  Procedure Laterality Date  . Total hip arthroplasty Left 12/28/2014    Procedure: TOTAL HIP ARTHROPLASTY ANTERIOR APPROACH;  Surgeon: Leandrew Koyanagi, MD;  Location: [facility];  Service: Orthopedics;  Laterality: Left;    There were no vitals filed for this visit.  Visit Diagnosis:  Weakness of left hip  Abnormality of gait      Subjective Assessment - 02/13/15 1152    Subjective Good day, no complaints today.  Homework  is helpling feel like i'm not so tight.    Currently in Pain? No/denies   Multiple Pain Sites No                         [facility] Adult PT Treatment/Exercise - 02/13/15 0001    Knee/Hip Exercises: Aerobic   Stationary Bike L2 x 8 min   Nustep Seat 7 L3 x 8 min   Knee/Hip Exercises: Machines for Strengthening   Total Gym Leg Press Seat #6 75# 2x10  vc to straighten knees at same time.   Knee/Hip Exercises: Standing   Hip Flexion Stengthening;Left;2 sets;10 reps  2# added   Hip Abduction Stengthening;Left;2 sets;10 reps;Knee straight  2# added   Hip Extension Stengthening;Left;2 sets;10 reps;Knee straight  2#   Knee/Hip Exercises: Seated   Long Arc Quad Strengthening;Left;2 sets;10 reps  2# added   Knee/Hip Exercises: Supine    Bridges with Ball Squeeze Both;2 sets;10 reps                  PT Short Term Goals - 02/13/15 1155    PT SHORT TERM GOAL #1   Title be independent in initial HEP   Time 4   Period Weeks   Status Achieved   PT SHORT TERM GOAL #2   Title wean from walker to cane for all distances   Time 4   Status Achieved   PT SHORT TERM GOAL #3   Title demonstrate symmetry with ambulation on level surface with cane   Time 4   Period Weeks   Status Achieved           PT Long Term Goals - 02/13/15 1155    PT LONG TERM GOAL #3   Title wean from cane > 75% of the time in the community with safety   Time 8   Period Weeks   Status Achieved   PT LONG TERM GOAL #4   Title perform community ambulation for > or = to 1 hour without need for rest   Time 8   Period Weeks   Status Achieved               Plan - 02/13/15 1207  Clinical Impression Statement No cane, ambulating longer distances, doing more activities wihtout any rest, performing more difficult exercises to strengthen the hip.    Pt will benefit from skilled therapeutic intervention in order to improve on the following deficits Abnormal gait;Decreased range of motion;Pain;Decreased strength   Rehab Potential Good   PT Frequency 2x / week   PT Duration 8 weeks   PT Treatment/Interventions ADLs/Self Care Home Management;Cryotherapy;Electrical Stimulation;Functional mobility training;Stair training;Gait training;Ultrasound;Moist Heat;Therapeutic activities;Therapeutic exercise;Neuromuscular re-education;Patient/family education;Passive range of motion;Scar mobilization;Manual techniques   PT Next Visit Plan Nustep, Bike, review stretches, hip strength, see how weights felt.    Consulted and Agree with Plan of Care Patient        Problem List Patient Active Problem List   Diagnosis Date Noted  . Fracture of femoral neck, left 12/28/2014  . Leukocytosis   . Elevated blood pressure     [name],  PTA 02/13/2015, 12:16 PM  [facility] [address], [place], [place], [address] Phone: [phone]   Fax:  [phone]

## 2015-02-15 DIAGNOSIS — I10 Essential (primary) hypertension: Secondary | ICD-10-CM | POA: Diagnosis not present

## 2015-02-15 DIAGNOSIS — D649 Anemia, unspecified: Secondary | ICD-10-CM | POA: Diagnosis not present

## 2015-02-16 ENCOUNTER — Ambulatory Visit: Payer: Commercial Managed Care - HMO

## 2015-02-16 DIAGNOSIS — R269 Unspecified abnormalities of gait and mobility: Secondary | ICD-10-CM | POA: Diagnosis not present

## 2015-02-16 DIAGNOSIS — R29898 Other symptoms and signs involving the musculoskeletal system: Secondary | ICD-10-CM | POA: Diagnosis not present

## 2015-02-16 NOTE — Therapy (Signed)
[facility] [address] [place], [place], [address] Phone: [phone]   Fax:  [phone]  Physical Therapy Treatment  Patient Details  Name: Sherri Mendez MRN: [id number] Date of Birth: 1935/12/11 Referring Provider:  Antony Blackbird, MD  Encounter Date: 02/16/2015      PT End of Session - 02/16/15 1520    Visit Number 5   Number of Visits 10   Date for PT Re-Evaluation 03/27/15   PT Start Time 2778   PT Stop Time 1529   PT Time Calculation (min) 41 min   Activity Tolerance Patient tolerated treatment well   Behavior During Therapy [facility] for tasks assessed/performed      History reviewed. No pertinent past medical history.  Past Surgical History  Procedure Laterality Date  . Total hip arthroplasty Left 12/28/2014    Procedure: TOTAL HIP ARTHROPLASTY ANTERIOR APPROACH;  Surgeon: Leandrew Koyanagi, MD;  Location: [place];  Service: Orthopedics;  Laterality: Left;    There were no vitals filed for this visit.  Visit Diagnosis:  Weakness of left hip  Abnormality of gait      Subjective Assessment - 02/16/15 1450    Subjective No pain.  Felt good after using ankle weights last session.     Currently in Pain? No/denies                         [facility] Adult PT Treatment/Exercise - 02/16/15 0001    Knee/Hip Exercises: Aerobic   Stationary Bike L2 x 8 min   Nustep Seat 7, arms 9, L3 x 8 min   Knee/Hip Exercises: Machines for Strengthening   Total Gym Leg Press Seat #6 75# 2x10 bil., Lt only 40# 2x10  vc to straighten knees at same time.   Knee/Hip Exercises: Standing   Hip Flexion Stengthening;Left;2 sets;10 reps  2# added   Hip Abduction Stengthening;Left;2 sets;10 reps;Knee straight  2# added   Hip Extension Stengthening;Left;2 sets;10 reps;Knee straight  2#   Rebounder weight shifting 3 ways x 1 minute each   Knee/Hip Exercises: Seated   Long Arc Quad Strengthening;Left;2 sets;10 reps  2#  added                  PT Short Term Goals - 02/13/15 1155    PT SHORT TERM GOAL #1   Title be independent in initial HEP   Time 4   Period Weeks   Status Achieved   PT SHORT TERM GOAL #2   Title wean from walker to cane for all distances   Time 4   Status Achieved   PT SHORT TERM GOAL #3   Title demonstrate symmetry with ambulation on level surface with cane   Time 4   Period Weeks   Status Achieved           PT Long Term Goals - 02/13/15 1155    PT LONG TERM GOAL #3   Title wean from cane > 75% of the time in the community with safety   Time 8   Period Weeks   Status Achieved   PT LONG TERM GOAL #4   Title perform community ambulation for > or = to 1 hour without need for rest   Time 8   Period Weeks   Status Achieved               Plan - 02/16/15 1452    Clinical Impression Statement Pt is  able to tolerate increased resistance and activity in the clinic and tolerated single leg on leg press with minor fatigue at the end of each set today.  Pt reports that her endurance is improving and feels like she is close to being at her prior level of function.  Gait remains unsteady and with mild antalgia on level surface. Pt with limited endurance and high level strength and proprioception and will benefit from skilled PT so safely progress this.     Pt will benefit from skilled therapeutic intervention in order to improve on the following deficits Abnormal gait;Decreased range of motion;Pain;Decreased strength   Rehab Potential Good   PT Frequency 2x / week   PT Duration 8 weeks   PT Treatment/Interventions ADLs/Self Care Home Management;Cryotherapy;Electrical Stimulation;Functional mobility training;Stair training;Gait training;Ultrasound;Moist Heat;Therapeutic activities;Therapeutic exercise;Neuromuscular re-education;Patient/family education;Passive range of motion;Scar mobilization;Manual techniques   PT Next Visit Plan Lt hip strength, prorprioception,  try resisted walking   Consulted and Agree with Plan of Care Patient        Problem List Patient Active Problem List   Diagnosis Date Noted  . Fracture of femoral neck, left 12/28/2014  . Leukocytosis   . Elevated blood pressure     TAKACS,KELLY, PT 02/16/2015, 3:23 PM  [facility] [address], [place], [place], [address] Phone: [phone]   Fax:  [phone]

## 2015-02-20 ENCOUNTER — Encounter: Payer: Self-pay | Admitting: Physical Therapy

## 2015-02-20 ENCOUNTER — Ambulatory Visit: Payer: Commercial Managed Care - HMO | Admitting: Physical Therapy

## 2015-02-20 DIAGNOSIS — R269 Unspecified abnormalities of gait and mobility: Secondary | ICD-10-CM

## 2015-02-20 DIAGNOSIS — R29898 Other symptoms and signs involving the musculoskeletal system: Secondary | ICD-10-CM | POA: Diagnosis not present

## 2015-02-20 NOTE — Therapy (Signed)
[facility] [address], [place], [place], [address] Phone: [phone]   Fax:  [phone]  Physical Therapy Treatment  Patient Details  Name: Sherri Mendez MRN: [id number] Date of Birth: 07/08/1935 Referring Provider:  Antony Blackbird, MD  Encounter Date: 02/20/2015      PT End of Session - 02/20/15 1240    Visit Number 6   Number of Visits 10   Date for PT Re-Evaluation 03/27/15   PT Start Time 6144   PT Stop Time 1314   PT Time Calculation (min) 41 min   Activity Tolerance Patient tolerated treatment well   Behavior During Therapy [facility] for tasks assessed/performed      History reviewed. No pertinent past medical history.  Past Surgical History  Procedure Laterality Date  . Total hip arthroplasty Left 12/28/2014    Procedure: TOTAL HIP ARTHROPLASTY ANTERIOR APPROACH;  Surgeon: Leandrew Koyanagi, MD;  Location: [place];  Service: Orthopedics;  Laterality: Left;    There were no vitals filed for this visit.  Visit Diagnosis:  Weakness of left hip  Abnormality of gait      Subjective Assessment - 02/20/15 1237    Subjective Now I can do everything in the house and able to walk her two little dogs   Currently in Pain? No/denies                         [facility] Adult PT Treatment/Exercise - 02/20/15 0001    Knee/Hip Exercises: Aerobic   Stationary Bike L2 x 8 min   Knee/Hip Exercises: Machines for Strengthening   Total Gym Leg Press Seat #6 80# 2x10 bil., Lt only 40# 2x10   Knee/Hip Exercises: Standing   Hip Flexion Stengthening;Left;2 sets;10 reps  2# added   Hip Abduction --   Hip Extension --   SLS 10sec x 1, 20sec x 2 pt with good balance   Walking with Sports Cord 25# forward/reverse& backward/reverse x 10   Knee/Hip Exercises: Seated   Long Arc Quad Strengthening;Left;2 sets;10 reps  2# added   Knee/Hip Exercises: Sidelying   Hip ABduction Left;2 sets;10 reps   Knee/Hip Exercises:  Prone   Hip Extension Left;2 sets;10 reps  2# added                   PT Short Term Goals - 02/13/15 1155    PT SHORT TERM GOAL #1   Title be independent in initial HEP   Time 4   Period Weeks   Status Achieved   PT SHORT TERM GOAL #2   Title wean from walker to cane for all distances   Time 4   Status Achieved   PT SHORT TERM GOAL #3   Title demonstrate symmetry with ambulation on level surface with cane   Time 4   Period Weeks   Status Achieved           PT Long Term Goals - 02/20/15 1253    PT LONG TERM GOAL #1   Title be independent in advanced HEP   Time 8   Period Weeks   Status On-going   PT LONG TERM GOAL #2   Title reduce FOTO to < or = to 41% limitation   Time 8   Period Weeks   Status On-going   PT LONG TERM GOAL #3   Title wean from cane > 75% of the time in the community with safety  Time 8   Period Weeks   Status Achieved   PT LONG TERM GOAL #4   Title perform community ambulation for > or = to 1 hour without need for rest   Time 8   Period Weeks   Status Achieved   PT LONG TERM GOAL #5   Title demonstrate 4+/5 Lt hip strength to improve endurance and safety with gait   Time 8   Period Weeks   Status On-going               Plan - 02/20/15 1241    Clinical Impression Statement pt able to tolerate increase of activity level and single leg stance is improving, with minor fatique at the end of PT session. Still a little less weightbearing on left LE esspecially on uneven surfaces per pt rreport. Pt will continue to benefit from    Rehab Potential Good   PT Frequency 2x / week   PT Duration 8 weeks   PT Treatment/Interventions ADLs/Self Care Home Management;Cryotherapy;Electrical Stimulation;Functional mobility training;Stair training;Gait training;Ultrasound;Moist Heat;Therapeutic activities;Therapeutic exercise;Neuromuscular re-education;Patient/family education;Passive range of motion;Scar mobilization;Manual techniques   PT  Next Visit Plan Continue Lt hip strength, proprioception   Consulted and Agree with Plan of Care Patient        Problem List Patient Active Problem List   Diagnosis Date Noted  . Fracture of femoral neck, left 12/28/2014  . Leukocytosis   . Elevated blood pressure     NAUMANN-HOUEGNIFIO,Brian Kocourek PTA 02/20/2015, 1:12 PM  [facility] [address], [place], [place], [address] Phone: [phone]   Fax:  [phone]

## 2015-02-23 ENCOUNTER — Encounter: Payer: Self-pay | Admitting: Physical Therapy

## 2015-02-23 ENCOUNTER — Ambulatory Visit: Payer: Commercial Managed Care - HMO | Admitting: Physical Therapy

## 2015-02-23 DIAGNOSIS — R29898 Other symptoms and signs involving the musculoskeletal system: Secondary | ICD-10-CM | POA: Diagnosis not present

## 2015-02-23 DIAGNOSIS — R269 Unspecified abnormalities of gait and mobility: Secondary | ICD-10-CM | POA: Diagnosis not present

## 2015-02-23 NOTE — Therapy (Signed)
[facility] [address] [place], [place], [address] Phone: [phone]   Fax:  [phone]  Physical Therapy Treatment  Patient Details  Name: Sherri Mendez MRN: [id number] Date of Birth: [birth year] Referring Provider:  Antony Blackbird, MD  Encounter Date: 02/23/2015      PT End of Session - 02/23/15 1543    Visit Number 7   Number of Visits 10   Date for PT Re-Evaluation 03/27/15   PT Start Time 0947   PT Stop Time 0962   PT Time Calculation (min) 45 min   Activity Tolerance Patient tolerated treatment well   Behavior During Therapy [facility] for tasks assessed/performed      History reviewed. No pertinent past medical history.  Past Surgical History  Procedure Laterality Date  . Total hip arthroplasty Left 12/28/2014    Procedure: TOTAL HIP ARTHROPLASTY ANTERIOR APPROACH;  Surgeon: Leandrew Koyanagi, MD;  Location: [place];  Service: Orthopedics;  Laterality: Left;    There were no vitals filed for this visit.  Visit Diagnosis:  Weakness of left hip  Abnormality of gait      Subjective Assessment - 02/23/15 1539    Subjective Pt reports feeling getting stronger every day.   Multiple Pain Sites No                         [facility] Adult PT Treatment/Exercise - 02/23/15 0001    Knee/Hip Exercises: Aerobic   Stationary Bike L2 x 8 min   Knee/Hip Exercises: Machines for Strengthening   Total Gym Leg Press Seat #6 80# 2x10 bil., Lt only 40# 2x10   Knee/Hip Exercises: Standing   Lateral Step Up Left;2 sets;10 reps   Step Down Left;2 sets;10 reps  tapping down with right with light UE support   SLS 20 sec x 2 pt with good balance   Walking with Sports Cord 25# forward/reverse& backward/reverse x 10, 20# sidestepping x 5 each    Other Standing Knee Exercises Lunge position left LE stepping back with Rt LE 2 x10 challenging initially   Other Standing Knee Exercises left foot on BOSU into lunge position  2x10 with 1 UE support                  PT Short Term Goals - 02/13/15 1155    PT SHORT TERM GOAL #1   Title be independent in initial HEP   Time 4   Period Weeks   Status Achieved   PT SHORT TERM GOAL #2   Title wean from walker to cane for all distances   Time 4   Status Achieved   PT SHORT TERM GOAL #3   Title demonstrate symmetry with ambulation on level surface with cane   Time 4   Period Weeks   Status Achieved           PT Long Term Goals - 02/20/15 1253    PT LONG TERM GOAL #1   Title be independent in advanced HEP   Time 8   Period Weeks   Status On-going   PT LONG TERM GOAL #2   Title reduce FOTO to < or = to 41% limitation   Time 8   Period Weeks   Status On-going   PT LONG TERM GOAL #3   Title wean from cane > 75% of the time in the community with safety   Time 8   Period Weeks  Status Achieved   PT LONG TERM GOAL #4   Title perform community ambulation for > or = to 1 hour without need for rest   Time 8   Period Weeks   Status Achieved   PT LONG TERM GOAL #5   Title demonstrate 4+/5 Lt hip strength to improve endurance and safety with gait   Time 8   Period Weeks   Status On-going               Plan - 02/23/15 1544    Clinical Impression Statement Pt presents with improved strength and balance, but still with fatique at end of Pt session due to decreased endurance and stamina. Less weightbearing on Lt LE on uneven surfaces per pt report. pt will continue to benefit from  PT to improve endurance and functional strength   Pt will benefit from skilled therapeutic intervention in order to improve on the following deficits Abnormal gait;Decreased range of motion;Pain;Decreased strength   Rehab Potential Good   PT Frequency 2x / week   PT Duration 8 weeks   PT Treatment/Interventions ADLs/Self Care Home Management;Cryotherapy;Electrical Stimulation;Functional mobility training;Stair training;Gait training;Ultrasound;Moist  Heat;Therapeutic activities;Therapeutic exercise;Neuromuscular re-education;Patient/family education;Passive range of motion;Scar mobilization;Manual techniques   PT Next Visit Plan Continue Lt hip strength, proprioception   Consulted and Agree with Plan of Care Patient        Problem List Patient Active Problem List   Diagnosis Date Noted  . Fracture of femoral neck, left 12/28/2014  . Leukocytosis   . Elevated blood pressure     NAUMANN-HOUEGNIFIO,Laquinton Bihm PTA 02/23/2015, 4:09 PM  [place] [address], [place], [place], [address] Phone: [phone]   Fax:  [phone]

## 2015-02-27 ENCOUNTER — Encounter: Payer: Self-pay | Admitting: Physical Therapy

## 2015-02-27 ENCOUNTER — Ambulatory Visit: Payer: Commercial Managed Care - HMO | Admitting: Physical Therapy

## 2015-02-27 DIAGNOSIS — R269 Unspecified abnormalities of gait and mobility: Secondary | ICD-10-CM

## 2015-02-27 DIAGNOSIS — R29898 Other symptoms and signs involving the musculoskeletal system: Secondary | ICD-10-CM

## 2015-02-27 NOTE — Therapy (Signed)
[facility] [address] [place], [place], [address] Phone: [phone]   Fax:  [phone]  Physical Therapy Treatment  Patient Details  Name: Sherri Mendez MRN: [id number] Date of Birth: 11/21/1935 Referring Provider:  Antony Blackbird, MD  Encounter Date: 02/27/2015      PT End of Session - 02/27/15 1714    Visit Number 8   Number of Visits 10   Date for PT Re-Evaluation 03/27/15   PT Start Time 1532   PT Stop Time 1616   PT Time Calculation (min) 44 min   Activity Tolerance Patient tolerated treatment well   Behavior During Therapy [facility] for tasks assessed/performed      History reviewed. No pertinent past medical history.  Past Surgical History  Procedure Laterality Date  . Total hip arthroplasty Left 12/28/2014    Procedure: TOTAL HIP ARTHROPLASTY ANTERIOR APPROACH;  Surgeon: Leandrew Koyanagi, MD;  Location: [place];  Service: Orthopedics;  Laterality: Left;    There were no vitals filed for this visit.  Visit Diagnosis:  Weakness of left hip  Abnormality of gait      Subjective Assessment - 02/27/15 1614    Subjective Pt reports she was able for cloth shopping, for extented time, without limitations   Currently in Pain? No/denies   Multiple Pain Sites No                         [facility] Adult PT Treatment/Exercise - 02/27/15 0001    Knee/Hip Exercises: Aerobic   Stationary Bike L3 x 8 min   Knee/Hip Exercises: Machines for Strengthening   Total Gym Leg Press Seat #6 80# 2x10 bil., Lt only 40# 2x10  may incr weight next visit   Knee/Hip Exercises: Standing   Lateral Step Up Left;2 sets;10 reps   Step Down 2 sets;10 reps;Right  tapping down with Rt LE to strengthen left LE   SLS 20 sec x 2 pt with good balance  3x10 sec standing on blue balance pad, to invcrease challeng   Walking with Sports Cord 25# forward/reverse& backward/reverse x 10, 20# sidestepping x 5 each    Other Standing  Knee Exercises Lunge position left LE, stepping back with Rt LE 2 x10 challenging initially   Other Standing Knee Exercises left foot on BOSU into lunge position 2x10 with 1 UE support  stepping up forward and sidestepping on BOSU 2x10 with 1 UE                   PT Short Term Goals - 02/13/15 1155    PT SHORT TERM GOAL #1   Title be independent in initial HEP   Time 4   Period Weeks   Status Achieved   PT SHORT TERM GOAL #2   Title wean from walker to cane for all distances   Time 4   Status Achieved   PT SHORT TERM GOAL #3   Title demonstrate symmetry with ambulation on level surface with cane   Time 4   Period Weeks   Status Achieved           PT Long Term Goals - 02/27/15 1718    PT LONG TERM GOAL #1   Title be independent in advanced HEP   Time 8   Period Weeks   Status On-going   PT LONG TERM GOAL #3   Title wean from cane > 75% of the time in the community  with safety   Time 8   Period Weeks   Status Achieved   PT LONG TERM GOAL #4   Title perform community ambulation for > or = to 1 hour without need for rest   Time 8   Period Weeks   Status Achieved   PT LONG TERM GOAL #5   Title demonstrate 4+/5 Lt hip strength to improve endurance and safety with gait   Time 8   Period Weeks   Status On-going               Plan - 02/27/15 1715    Clinical Impression Statement Pt presents with improved strength and balance, but still with decreased endurance and stamina. Per pt reports still less weightbearing on Lt LE und unsteadiness on uneven surfaces. Pt will continue to bendift from advanced balance activities, strength and endurance    Pt will benefit from skilled therapeutic intervention in order to improve on the following deficits Abnormal gait;Decreased range of motion;Pain;Decreased strength   Rehab Potential Good   PT Frequency 2x / week   PT Duration 8 weeks   PT Treatment/Interventions ADLs/Self Care Home  Management;Cryotherapy;Electrical Stimulation;Functional mobility training;Stair training;Gait training;Ultrasound;Moist Heat;Therapeutic activities;Therapeutic exercise;Neuromuscular re-education;Patient/family education;Passive range of motion;Scar mobilization;Manual techniques   PT Next Visit Plan Continue with advanced balance, ,strength, and proprioception   Consulted and Agree with Plan of Care Patient        Problem List Patient Active Problem List   Diagnosis Date Noted  . Fracture of femoral neck, left 12/28/2014  . Leukocytosis   . Elevated blood pressure     NAUMANN-HOUEGNIFIO,Quantavius Humm  PTA  02/27/2015, 5:23 PM  [place] [address] [place], [place], [address] Phone: [phone]   Fax:  [phone]

## 2015-03-02 ENCOUNTER — Ambulatory Visit: Payer: Commercial Managed Care - HMO | Attending: Family Medicine

## 2015-03-02 DIAGNOSIS — R29898 Other symptoms and signs involving the musculoskeletal system: Secondary | ICD-10-CM | POA: Diagnosis not present

## 2015-03-02 DIAGNOSIS — R269 Unspecified abnormalities of gait and mobility: Secondary | ICD-10-CM | POA: Diagnosis not present

## 2015-03-02 NOTE — Therapy (Signed)
[facility] [address] [place], [place], [address] Phone: [phone]   Fax:  [phone]  Physical Therapy Treatment  Patient Details  Name: Sherri Mendez MRN: [id number] Date of Birth: [birth year] Referring Provider:  Antony Blackbird, MD  Encounter Date: 03/02/2015      PT End of Session - 03/02/15 1559    Visit Number 9   Number of Visits 10   Date for PT Re-Evaluation 03/27/15   PT Start Time 6812   PT Stop Time 1603   PT Time Calculation (min) 33 min   Activity Tolerance Patient limited by fatigue  Pt was not feeling well due to cold   Behavior During Therapy [facility] for tasks assessed/performed      History reviewed. No pertinent past medical history.  Past Surgical History  Procedure Laterality Date  . Total hip arthroplasty Left 12/28/2014    Procedure: TOTAL HIP ARTHROPLASTY ANTERIOR APPROACH;  Surgeon: Leandrew Koyanagi, MD;  Location: [place];  Service: Orthopedics;  Laterality: Left;    There were no vitals filed for this visit.  Visit Diagnosis:  Weakness of left hip  Abnormality of gait      Subjective Assessment - 03/02/15 1536    Subjective Pt has a cold today.  feeling a little bit tired.     Currently in Pain? No/denies            [facility] PT Assessment - 03/02/15 0001    Observation/Other Assessments   Focus on Therapeutic Outcomes (FOTO)  40% limitation                     [facility] Adult PT Treatment/Exercise - 03/02/15 0001    Knee/Hip Exercises: Aerobic   Stationary Bike L3 x 8 min   Knee/Hip Exercises: Machines for Strengthening   Total Gym Leg Press Seat #6 90# 2x10 bil., Lt only 45# 2x10   Knee/Hip Exercises: Standing   Forward Step Up 2 sets;10 reps;Left;Step Height: 8";Hand Hold: 1   Rebounder weight shifting 3 ways x 1 minute each   Walking with Sports Cord 25# forward/reverse& backward/reverse x 10, 20# sidestepping x 10 each                   PT Short  Term Goals - 02/13/15 1155    PT SHORT TERM GOAL #1   Title be independent in initial HEP   Time 4   Period Weeks   Status Achieved   PT SHORT TERM GOAL #2   Title wean from walker to cane for all distances   Time 4   Status Achieved   PT SHORT TERM GOAL #3   Title demonstrate symmetry with ambulation on level surface with cane   Time 4   Period Weeks   Status Achieved           PT Long Term Goals - 02/27/15 1718    PT LONG TERM GOAL #1   Title be independent in advanced HEP   Time 8   Period Weeks   Status On-going   PT LONG TERM GOAL #3   Title wean from cane > 75% of the time in the community with safety   Time 8   Period Weeks   Status Achieved   PT LONG TERM GOAL #4   Title perform community ambulation for > or = to 1 hour without need for rest   Time 8   Period Weeks   Status  Achieved   PT LONG TERM GOAL #5   Title demonstrate 4+/5 Lt hip strength to improve endurance and safety with gait   Time 8   Period Weeks   Status On-going               Plan - 03/02/15 1541    Clinical Impression Statement Pt with continued deficits with strength and endurance.  This is very mild limitation at this time.Pt tolerated increased weight on the leg press well today and performed 10 reps of sidestepping against resistance.  Pt with 1-2 more sessions probable to finalize HEP.   Pt will benefit from skilled therapeutic intervention in order to improve on the following deficits Abnormal gait;Decreased range of motion;Pain;Decreased strength   Rehab Potential Good   PT Frequency 2x / week   PT Duration 8 weeks   PT Treatment/Interventions ADLs/Self Care Home Management;Cryotherapy;Electrical Stimulation;Functional mobility training;Stair training;Gait training;Ultrasound;Moist Heat;Therapeutic activities;Therapeutic exercise;Neuromuscular re-education;Patient/family education;Passive range of motion;Scar mobilization;Manual techniques   PT Next Visit Plan G-codes next  session, discuss discharge with pt.     Consulted and Agree with Plan of Care Patient        Problem List Patient Active Problem List   Diagnosis Date Noted  . Fracture of femoral neck, left 12/28/2014  . Leukocytosis   . Elevated blood pressure     TAKACS,KELLY , PT  03/02/2015, 4:08 PM  [facility] [address] [place], [place], [address] Phone: [phone]   Fax:  [phone]

## 2015-03-08 ENCOUNTER — Ambulatory Visit: Payer: Commercial Managed Care - HMO

## 2015-03-08 DIAGNOSIS — R29898 Other symptoms and signs involving the musculoskeletal system: Secondary | ICD-10-CM | POA: Diagnosis not present

## 2015-03-08 DIAGNOSIS — R269 Unspecified abnormalities of gait and mobility: Secondary | ICD-10-CM | POA: Diagnosis not present

## 2015-03-08 NOTE — Therapy (Signed)
[facility] [address] [place], [place], [address] Phone: [phone]   Fax:  [phone]  Physical Therapy Treatment  Patient Details  Name: Sherri Mendez MRN: [id number] Date of Birth: 1936/05/28 Referring Provider:  Antony Blackbird, MD  Encounter Date: 03/08/2015      PT End of Session - 03/08/15 1430    Visit Number 10   PT Start Time 0109   PT Stop Time 3235   PT Time Calculation (min) 37 min   Activity Tolerance Patient tolerated treatment well   Behavior During Therapy [facility] for tasks assessed/performed      History reviewed. No pertinent past medical history.  Past Surgical History  Procedure Laterality Date  . Total hip arthroplasty Left 12/28/2014    Procedure: TOTAL HIP ARTHROPLASTY ANTERIOR APPROACH;  Surgeon: Leandrew Koyanagi, MD;  Location: [place];  Service: Orthopedics;  Laterality: Left;    There were no vitals filed for this visit.  Visit Diagnosis:  Weakness of left hip      Subjective Assessment - 03/08/15 1401    Subjective Pt is feeling good.  Ready for D/C.   Currently in Pain? No/denies            [facility] PT Assessment - 03/08/15 0001    Assessment   Medical Diagnosis s/p Lt hip fracture (Z87.81)   Onset Date/Surgical Date 12/27/14   Prior Function   Level of Independence Independent   Observation/Other Assessments   Focus on Therapeutic Outcomes (FOTO)  40% limitation   ROM / Strength   AROM / PROM / Strength Strength   Strength   Overall Strength Deficits   Strength Assessment Site Hip;Knee   Right/Left Hip Left   Left Hip Flexion 4+/5   Left Hip Extension 4+/5   Left Hip ABduction 4+/5   Right/Left Knee Left   Left Knee Flexion 5/5   Left Knee Extension 5/5                     [facility] Adult PT Treatment/Exercise - 03/08/15 0001    Knee/Hip Exercises: Aerobic   Stationary Bike L3 x 8 min   Knee/Hip Exercises: Machines for Strengthening   Total Gym Leg  Press Seat #6 90# 3x10 bil., Lt only 45# 3x10   Knee/Hip Exercises: Standing   Forward Step Up 2 sets;10 reps;Left;Step Height: 8";Hand Hold: 1   Rebounder weight shifting 3 ways x 1 minute each   Walking with Sports Cord 25# forward/reverse& backward/reverse x 10, 20# sidestepping x 10 each                   PT Short Term Goals - 02/13/15 1155    PT SHORT TERM GOAL #1   Title be independent in initial HEP   Time 4   Period Weeks   Status Achieved   PT SHORT TERM GOAL #2   Title wean from walker to cane for all distances   Time 4   Status Achieved   PT SHORT TERM GOAL #3   Title demonstrate symmetry with ambulation on level surface with cane   Time 4   Period Weeks   Status Achieved           PT Long Term Goals - 03/08/15 1401    PT LONG TERM GOAL #1   Title be independent in advanced HEP   Status Achieved   PT LONG TERM GOAL #2   Title reduce FOTO to <  or = to 41% limitation   Status Achieved  40% limitation   PT LONG TERM GOAL #3   Title wean from cane > 75% of the time in the community with safety   Status Achieved   PT LONG TERM GOAL #4   Title perform community ambulation for > or = to 1 hour without need for rest   Status Achieved   PT LONG TERM GOAL #5   Title demonstrate 4+/5 Lt hip strength to improve endurance and safety with gait   Status Achieved               Plan - 03/30/15 1413    Clinical Impression Statement Pt is ready for D/C.  Lt hip strength is 4+/5 and knee is 5/5 throughout.  Pt denies any limitations at this time and will be discharged to HEP.     PT Next Visit Plan D/C PT to HEP   Consulted and Agree with Plan of Care Patient          G-Codes - Mar 30, 2015 1411    Functional Assessment Tool Used FOTO 40% limitation   Functional Limitation Mobility: Walking and moving around   Mobility: Walking and Moving Around Goal Status [id number]) At least 40 percent but less than 60 percent impaired, limited or restricted    Mobility: Walking and Moving Around Discharge Status [phone]) At least 40 percent but less than 60 percent impaired, limited or restricted      Problem List Patient Active Problem List   Diagnosis Date Noted  . Fracture of femoral neck, left 12/28/2014  . Leukocytosis   . Elevated blood pressure    PHYSICAL THERAPY DISCHARGE SUMMARY  Visits from Start of Care: 10  Current functional level related to goals / functional outcomes: See above for current status.  Pt denies any limitations at this time.    Remaining deficits: Mild weakness in the Lt hip and limitation with high level endurance tasks.  Pt has HEP in place for continued strength and endurance gains.     Education / Equipment: HEP Plan: Patient agrees to discharge.  Patient goals were met. Patient is being discharged due to meeting the stated rehab goals.  ?????     Shaye Elling, PT 2015-03-30, 2:31 PM  [facility] [address], [place], [place], [address] Phone: [phone]   Fax:  [phone]

## 2015-03-15 ENCOUNTER — Ambulatory Visit: Payer: Self-pay

## 2015-03-31 DIAGNOSIS — D509 Iron deficiency anemia, unspecified: Secondary | ICD-10-CM | POA: Diagnosis not present

## 2015-05-08 DIAGNOSIS — D509 Iron deficiency anemia, unspecified: Secondary | ICD-10-CM | POA: Diagnosis not present

## 2015-05-15 DIAGNOSIS — D649 Anemia, unspecified: Secondary | ICD-10-CM | POA: Diagnosis not present

## 2015-05-15 DIAGNOSIS — I10 Essential (primary) hypertension: Secondary | ICD-10-CM | POA: Diagnosis not present

## 2015-05-15 DIAGNOSIS — L989 Disorder of the skin and subcutaneous tissue, unspecified: Secondary | ICD-10-CM | POA: Diagnosis not present

## 2015-05-15 DIAGNOSIS — Z23 Encounter for immunization: Secondary | ICD-10-CM | POA: Diagnosis not present

## 2015-05-16 DIAGNOSIS — S72042D Displaced fracture of base of neck of left femur, subsequent encounter for closed fracture with routine healing: Secondary | ICD-10-CM | POA: Diagnosis not present

## 2015-07-11 DIAGNOSIS — L821 Other seborrheic keratosis: Secondary | ICD-10-CM | POA: Diagnosis not present

## 2015-07-11 DIAGNOSIS — L820 Inflamed seborrheic keratosis: Secondary | ICD-10-CM | POA: Diagnosis not present

## 2015-07-11 DIAGNOSIS — D485 Neoplasm of uncertain behavior of skin: Secondary | ICD-10-CM | POA: Diagnosis not present

## 2015-07-11 DIAGNOSIS — L814 Other melanin hyperpigmentation: Secondary | ICD-10-CM | POA: Diagnosis not present

## 2015-07-31 ENCOUNTER — Other Ambulatory Visit: Payer: Self-pay | Admitting: Family Medicine

## 2015-07-31 DIAGNOSIS — E2839 Other primary ovarian failure: Secondary | ICD-10-CM

## 2015-08-16 DIAGNOSIS — L820 Inflamed seborrheic keratosis: Secondary | ICD-10-CM | POA: Diagnosis not present

## 2015-08-16 DIAGNOSIS — L905 Scar conditions and fibrosis of skin: Secondary | ICD-10-CM | POA: Diagnosis not present

## 2015-08-24 ENCOUNTER — Other Ambulatory Visit: Payer: Commercial Managed Care - HMO

## 2015-09-13 ENCOUNTER — Ambulatory Visit
Admission: RE | Admit: 2015-09-13 | Discharge: 2015-09-13 | Disposition: A | Discharge status: Home / Self Care | Payer: Commercial Managed Care - HMO | Source: Ambulatory Visit | Attending: Family Medicine | Admitting: Family Medicine

## 2015-09-13 DIAGNOSIS — M85851 Other specified disorders of bone density and structure, right thigh: Secondary | ICD-10-CM | POA: Diagnosis not present

## 2015-09-13 DIAGNOSIS — E2839 Other primary ovarian failure: Secondary | ICD-10-CM

## 2015-10-17 DIAGNOSIS — H1859 Other hereditary corneal dystrophies: Secondary | ICD-10-CM | POA: Diagnosis not present

## 2015-10-17 DIAGNOSIS — H47022 Hemorrhage in optic nerve sheath, left eye: Secondary | ICD-10-CM | POA: Diagnosis not present

## 2015-10-17 DIAGNOSIS — H43813 Vitreous degeneration, bilateral: Secondary | ICD-10-CM | POA: Diagnosis not present

## 2015-10-17 DIAGNOSIS — H2513 Age-related nuclear cataract, bilateral: Secondary | ICD-10-CM | POA: Diagnosis not present

## 2015-10-17 DIAGNOSIS — H25013 Cortical age-related cataract, bilateral: Secondary | ICD-10-CM | POA: Diagnosis not present

## 2015-10-30 HISTORY — PX: CATARACT EXTRACTION W/ INTRAOCULAR LENS IMPLANT: SHX1309

## 2015-10-31 DIAGNOSIS — H43813 Vitreous degeneration, bilateral: Secondary | ICD-10-CM | POA: Diagnosis not present

## 2015-10-31 DIAGNOSIS — H1859 Other hereditary corneal dystrophies: Secondary | ICD-10-CM | POA: Diagnosis not present

## 2015-10-31 DIAGNOSIS — H25813 Combined forms of age-related cataract, bilateral: Secondary | ICD-10-CM | POA: Diagnosis not present

## 2015-11-13 DIAGNOSIS — Z79899 Other long term (current) drug therapy: Secondary | ICD-10-CM | POA: Diagnosis not present

## 2015-11-13 DIAGNOSIS — R319 Hematuria, unspecified: Secondary | ICD-10-CM | POA: Diagnosis not present

## 2015-11-13 DIAGNOSIS — Z7982 Long term (current) use of aspirin: Secondary | ICD-10-CM | POA: Diagnosis not present

## 2015-11-13 DIAGNOSIS — I10 Essential (primary) hypertension: Secondary | ICD-10-CM | POA: Diagnosis not present

## 2015-11-23 DIAGNOSIS — H268 Other specified cataract: Secondary | ICD-10-CM | POA: Diagnosis not present

## 2015-11-23 DIAGNOSIS — Z961 Presence of intraocular lens: Secondary | ICD-10-CM | POA: Diagnosis not present

## 2015-11-23 DIAGNOSIS — H18231 Secondary corneal edema, right eye: Secondary | ICD-10-CM | POA: Diagnosis not present

## 2015-11-23 DIAGNOSIS — H2511 Age-related nuclear cataract, right eye: Secondary | ICD-10-CM | POA: Diagnosis not present

## 2015-11-23 DIAGNOSIS — H25011 Cortical age-related cataract, right eye: Secondary | ICD-10-CM | POA: Diagnosis not present

## 2015-11-28 DIAGNOSIS — R8290 Unspecified abnormal findings in urine: Secondary | ICD-10-CM | POA: Diagnosis not present

## 2015-11-28 DIAGNOSIS — I10 Essential (primary) hypertension: Secondary | ICD-10-CM | POA: Diagnosis not present

## 2015-11-29 DIAGNOSIS — R319 Hematuria, unspecified: Secondary | ICD-10-CM | POA: Diagnosis not present

## 2015-11-29 DIAGNOSIS — I10 Essential (primary) hypertension: Secondary | ICD-10-CM | POA: Diagnosis not present

## 2015-12-28 DIAGNOSIS — R3121 Asymptomatic microscopic hematuria: Secondary | ICD-10-CM | POA: Diagnosis not present

## 2015-12-28 DIAGNOSIS — N3941 Urge incontinence: Secondary | ICD-10-CM | POA: Diagnosis not present

## 2016-01-19 DIAGNOSIS — R3121 Asymptomatic microscopic hematuria: Secondary | ICD-10-CM | POA: Diagnosis not present

## 2016-01-19 DIAGNOSIS — R319 Hematuria, unspecified: Secondary | ICD-10-CM | POA: Diagnosis not present

## 2016-01-23 DIAGNOSIS — R3121 Asymptomatic microscopic hematuria: Secondary | ICD-10-CM | POA: Diagnosis not present

## 2016-01-24 ENCOUNTER — Other Ambulatory Visit: Payer: Self-pay | Admitting: Urology

## 2016-01-31 ENCOUNTER — Encounter: Payer: Self-pay | Admitting: Gynecologic Oncology

## 2016-01-31 ENCOUNTER — Ambulatory Visit: Payer: Commercial Managed Care - HMO | Attending: Gynecologic Oncology | Admitting: Gynecologic Oncology

## 2016-01-31 VITALS — BP 171/72 | HR 91 | Temp 98.1°F | Resp 18 | Ht 65.0 in | Wt 154.7 lb

## 2016-01-31 DIAGNOSIS — I10 Essential (primary) hypertension: Secondary | ICD-10-CM | POA: Insufficient documentation

## 2016-01-31 DIAGNOSIS — C531 Malignant neoplasm of exocervix: Secondary | ICD-10-CM | POA: Insufficient documentation

## 2016-01-31 DIAGNOSIS — Z87891 Personal history of nicotine dependence: Secondary | ICD-10-CM | POA: Diagnosis not present

## 2016-01-31 DIAGNOSIS — Z96642 Presence of left artificial hip joint: Secondary | ICD-10-CM | POA: Diagnosis not present

## 2016-01-31 DIAGNOSIS — C539 Malignant neoplasm of cervix uteri, unspecified: Secondary | ICD-10-CM | POA: Diagnosis present

## 2016-01-31 DIAGNOSIS — Z888 Allergy status to other drugs, medicaments and biological substances status: Secondary | ICD-10-CM | POA: Insufficient documentation

## 2016-01-31 NOTE — Patient Instructions (Signed)
Plan to have a PET scan to evaluate for metastatic disease.  We will call you with the results of your biopsy from today.  We will also arrange for you to meet with Dr. Gery Pray, Radiation Oncologist, and Dr. Evlyn Clines, Medical Oncologist, to discuss treatment including chemotherapy and radiation.  Please call for any questions or concerns.

## 2016-01-31 NOTE — Progress Notes (Signed)
GYNECOLOGIC ONCOLOGY NEW PATIENT CONSULTATION  Date of Service: 01/31/16, 10:45 Referring Provider: Dr. Antony Blackbird, Dr. [name] Consulting Provider: Marcello Fennel. Carlis Abbott, MD  HISTORY OF PRESENT ILLNESS: Sherri Mendez is a 80 y.o. woman who is seen in consultation at the request of Dr. Karsten Ro for evaluation of suspicion for cervical cancer on CT imaging.  The patient presented to Dr. Karsten Ro in referral from Dr. Chapman Fitch for evaluation of hematuria. As part of this evaluation she underwent a CT scan and cystoscopy. She was noted to have concern for a cervical/uterine mass on CT with some mildly enlarged pelvic lymph nodes. Cystoscopy revealed a <1/2cm bladder tumor, which was not biopsied at the time but is planned for future resection per the patient's report.  She reports noticing bleeding beginning in May 2017. She reports some vaginal discharge versus urinary leakage, which has been ongoing for a number of years. She requires 4-5 panty liners per day for this. She denies a history of dysplasia, but has not had pap screening in >10 years. She denies changes in bowel or bladder function. She denies weight loss or night sweats.   She has no significant family history. She is not a smoker.  PAST MEDICAL HISTORY: Past Medical History:  Diagnosis Date  . Anemia   . Cataract   . Hypertension     PAST SURGICAL HISTORY: Past Surgical History:  Procedure Laterality Date  . TOTAL HIP ARTHROPLASTY Left 12/28/2014   Procedure: TOTAL HIP ARTHROPLASTY ANTERIOR APPROACH;  Surgeon: Leandrew Koyanagi, MD;  Location: [place];  Service: Orthopedics;  Laterality: Left;     MEDICATIONS:  Current Outpatient Prescriptions:  .  acetaminophen (TYLENOL) 325 MG tablet, Take 650 mg by mouth every 6 (six) hours as needed., Disp: , Rfl:  .  amlodipine-benazepril (LOTREL) 2.5-10 MG per capsule, Take 1 capsule by mouth daily., Disp: , Rfl:  .  Multiple Vitamins-Minerals (MULTIVITAMIN WITH MINERALS) tablet, Take 1 tablet  by mouth daily., Disp: , Rfl:  .  oxyCODONE (OXY IR/ROXICODONE) 5 MG immediate release tablet, Take 1-3 tablets (5-15 mg total) by mouth every 4 (four) hours as needed., Disp: 90 tablet, Rfl: 0  ALLERGIES: Allergies  Allergen Reactions  . Sulfa Antibiotics Hives    FAMILY HISTORY: Family History  Problem Relation Age of Onset  . Cancer Brother   . Cancer Maternal Aunt     SOCIAL HISTORY: Social History   Social History  . Marital status: Married    Spouse name: N/A  . Number of children: N/A  . Years of education: N/A   Occupational History  . Not on file.   Social History Main Topics  . Smoking status: Former Smoker    Quit date: 01/30/1997  . Smokeless tobacco: Never Used  . Alcohol use 2.4 oz/week    4 Glasses of wine per week  . Drug use: No  . Sexual activity: Not on file   Other Topics Concern  . Not on file   Social History Narrative  . No narrative on file    REVIEW OF SYSTEMS: Complete 10-system review is negative except for the following: as per HPI.  PHYSICAL EXAM: BP (!) 171/72 (BP Location: Left Arm, Patient Position: Sitting) Comment: informed nurse  Pulse 91   Temp 98.1 F (36.7 C) (Oral)   Resp 18   Ht '5\' 5"'$  (1.651 m)   Wt 154 lb 11.2 oz (70.2 kg)   SpO2 98%   BMI 25.74 kg/m  General: Alert, oriented, no  acute distress. HEENT: Normocephalic, atraumatic.  Sclera anicteric, posterior oropharynx clear.  Normal dentition. Chest: Clear to auscultation bilaterally. Cardiovascular: Regular rate and rhythm, no murmurs, rubs, or gallops. Abdomen: Normoactive bowel sounds.  Soft, nondistended, nontender to palpation.  No masses or hepatosplenomegaly appreciated.  No evidence of hernia.  No palpable fluid wave.  No incisions. Extremities: Grossly normal range of motion.  Warm, well perfused.  No edema bilaterally. Skin: No rashes or lesions. Lymphatics: No cervical, supraclavicular, or inguinal adenopathy. GU: External genitalia without lesions.   On speculum exam, necrotic tumor replacing the cervix and extending down the anterior vagina to the upper 1/3.  Bimanual exam reveals tumor replacing the cervix and extending into the right parametrium without sidewall involvement. Rectovaginal exam confirms the above findings. No left parametrial involvement noted.  LABORATORY AND RADIOLOGIC DATA: Outside medical records were reviewed to synthesize the above history, along with the history and physical obtained during the visit.  Outside laboratory, pathology, and imaging reports were reviewed, with pertinent results below.    CT with thickened endometrial, large irregular cervical mass, scattered mesenteric and pelvic lymph nodes and a right obturator lymph node measuring up to 25m.  ASSESSMENT AND PLAN: Sherri SPAINHOWERis a 80y.o. woman with at least stage IIIB cervical cancer on clinical exam due to right parametrial extension.  Biopsy is obtained today to confirm the diagnosis. We will obtain a PET scan to evaluate for metastatic disease. If this a localized tumor, I would favor starting with chemoradiation for local control. If this is well tolerated additional systemic therapy with platinum/taxane/avastin could be added. If metastatic disease is noted on imaging, starting with systemic therapy would be more appropriate.  I spoke with Dr. OKarsten Rotoday who feels the bladder lesion seen on cystoscopy represents a separate primary transitional cell tumor. He would like to proceed with excision of this mass as scheduled. If this tumor is squamous, then this will in fact represent a IVA cervical cancer. We will await the final pathology report to confirm her stage.   Referral is placed today to radiation oncology and medical oncology for weekly cisplatin and we will plan to proceed with the plan of care as outlined above.  A copy of this note was sent to the patient's referring provider. I appreciate the opportunity to be involved in the care of  this patient.  LMarcello Fennel CCarlis Abbott MD

## 2016-02-01 ENCOUNTER — Telehealth: Payer: Self-pay | Admitting: Gynecologic Oncology

## 2016-02-01 NOTE — Telephone Encounter (Signed)
Left message for the patient's daughter who accompanied her mother to her appt yesterday about wanting to discuss appts for tomorrow.

## 2016-02-01 NOTE — Telephone Encounter (Signed)
Left message asking the patient to please call the office to discuss biopsy results and upcoming appts for tomorrow with Dr. Evlyn Clines.

## 2016-02-02 ENCOUNTER — Ambulatory Visit (HOSPITAL_BASED_OUTPATIENT_CLINIC_OR_DEPARTMENT_OTHER): Payer: Commercial Managed Care - HMO | Admitting: Oncology

## 2016-02-02 ENCOUNTER — Encounter: Payer: Self-pay | Admitting: Oncology

## 2016-02-02 ENCOUNTER — Other Ambulatory Visit: Payer: Self-pay | Admitting: Oncology

## 2016-02-02 ENCOUNTER — Telehealth: Payer: Self-pay | Admitting: Oncology

## 2016-02-02 ENCOUNTER — Other Ambulatory Visit (HOSPITAL_BASED_OUTPATIENT_CLINIC_OR_DEPARTMENT_OTHER): Payer: Commercial Managed Care - HMO

## 2016-02-02 VITALS — BP 163/79 | HR 61 | Temp 98.5°F | Resp 18 | Ht 65.0 in | Wt 153.6 lb

## 2016-02-02 DIAGNOSIS — C531 Malignant neoplasm of exocervix: Secondary | ICD-10-CM

## 2016-02-02 DIAGNOSIS — C539 Malignant neoplasm of cervix uteri, unspecified: Secondary | ICD-10-CM

## 2016-02-02 DIAGNOSIS — M8580 Other specified disorders of bone density and structure, unspecified site: Secondary | ICD-10-CM | POA: Diagnosis not present

## 2016-02-02 DIAGNOSIS — D500 Iron deficiency anemia secondary to blood loss (chronic): Secondary | ICD-10-CM

## 2016-02-02 DIAGNOSIS — N329 Bladder disorder, unspecified: Secondary | ICD-10-CM

## 2016-02-02 DIAGNOSIS — I10 Essential (primary) hypertension: Secondary | ICD-10-CM

## 2016-02-02 LAB — CBC WITH DIFFERENTIAL/PLATELET
BASO%: 0.7 % (ref 0.0–2.0)
Basophils Absolute: 0.1 10e3/uL (ref 0.0–0.1)
EOS%: 1.1 % (ref 0.0–7.0)
Eosinophils Absolute: 0.1 10e3/uL (ref 0.0–0.5)
HCT: 41.1 % (ref 34.8–46.6)
HGB: 13.6 g/dL (ref 11.6–15.9)
LYMPH%: 16.5 % (ref 14.0–49.7)
MCH: 29.2 pg (ref 25.1–34.0)
MCHC: 33 g/dL (ref 31.5–36.0)
MCV: 88.4 fL (ref 79.5–101.0)
MONO#: 0.5 10e3/uL (ref 0.1–0.9)
MONO%: 6.2 % (ref 0.0–14.0)
NEUT#: 6.3 10e3/uL (ref 1.5–6.5)
NEUT%: 75.5 % (ref 38.4–76.8)
Platelets: 296 10e3/uL (ref 145–400)
RBC: 4.66 10e6/uL (ref 3.70–5.45)
RDW: 13.5 % (ref 11.2–14.5)
WBC: 8.4 10e3/uL (ref 3.9–10.3)
lymph#: 1.4 10e3/uL (ref 0.9–3.3)

## 2016-02-02 LAB — COMPREHENSIVE METABOLIC PANEL
ALBUMIN: 4.1 g/dL (ref 3.5–5.0)
ALK PHOS: 95 U/L (ref 40–150)
ALT: 11 U/L (ref 0–55)
AST: 14 U/L (ref 5–34)
Anion Gap: 10 mEq/L (ref 3–11)
BILIRUBIN TOTAL: 0.41 mg/dL (ref 0.20–1.20)
BUN: 15.1 mg/dL (ref 7.0–26.0)
CALCIUM: 9.9 mg/dL (ref 8.4–10.4)
CO2: 26 mEq/L (ref 22–29)
CREATININE: 0.8 mg/dL (ref 0.6–1.1)
Chloride: 104 mEq/L (ref 98–109)
EGFR: 70 mL/min/{1.73_m2} — ABNORMAL LOW (ref >90)
Glucose: 101 mg/dl (ref 70–140)
Potassium: 3.8 mEq/L (ref 3.5–5.1)
Sodium: 140 mEq/L (ref 136–145)
TOTAL PROTEIN: 7.6 g/dL (ref 6.4–8.3)

## 2016-02-02 LAB — IRON AND TIBC
%SAT: 18 % — ABNORMAL LOW (ref 21–57)
Iron: 76 ug/dL (ref 41–142)
TIBC: 423 ug/dL (ref 236–444)
UIBC: 346 ug/dL (ref 120–384)

## 2016-02-02 LAB — FERRITIN: Ferritin: 18 ng/mL (ref 9–269)

## 2016-02-02 LAB — MAGNESIUM: MAGNESIUM: 2.6 mg/dL — AB (ref 1.5–2.5)

## 2016-02-02 NOTE — Telephone Encounter (Signed)
per pof to sch pt appt-sent Mw emai to sch 9/5-sent LL email to adv MD appt too late for 8/28-will complete & call pt once reply

## 2016-02-02 NOTE — Progress Notes (Signed)
GYN Location of Tumor / Histology:  at least stage IIIB cervical cancer on clinical exam due to right parametrial extension.  Sherri Mendez presented with symptoms of: had hematuria and went to the urologist.  Biopsies revealed:   01/31/16 Diagnosis Cervix, biopsy, ectocervix - INVASIVE POORLY DIFFERENTIATED CARCINOMA. - SEE MICROSCOPIC DESCRIPTION.  Past/Anticipated interventions by Gyn/Onc surgery, if any:   Past/Anticipated interventions by medical oncology, if any: sensitizing CDDP with radiation to start 02/19/16  Weight changes, if any: no  Bowel/Bladder complaints, if any: denies having bowel issues, reports having urinary frequency.  Nausea/Vomiting, if any: no  Pain issues, if any:  no  SAFETY ISSUES:  Prior radiation? no  Pacemaker/ICD? no  Possible current pregnancy? no  Is the patient on methotrexate? no  Current Complaints / other details:  Patient is here with her daughter.  Patient has a bladder lesion that was seen on cystoscopy which represents a separate primary transitional cell tumor. Dr. Karsten Ro would like to proceed with excision of this mass as scheduled on 02/16/16.  Patient has a PET scan scheduled for 02/06/16.  Patient reports having a brown vaginal discharge now.  She denies having any vaginal bleeding.  BP (!) 175/74 (BP Location: Right Arm, Patient Position: Sitting)   Pulse 79   Temp 97.8 F (36.6 C) (Oral)   Ht 5' 4.5" (1.638 m)   Wt 154 lb 14.4 oz (70.3 kg)   SpO2 100%   BMI 26.18 kg/m    Wt Readings from Last 3 Encounters:  02/07/16 154 lb 14.4 oz (70.3 kg)  02/02/16 153 lb 9.6 oz (69.7 kg)  01/31/16 154 lb 11.2 oz (70.2 kg)

## 2016-02-02 NOTE — Progress Notes (Signed)
[place] NEW PATIENT EVALUATION   Name: Sherri Mendez Date: February 02, 2016  MRN: [id number] DOB: February 20, 1936  REFERRING PHYSICIAN: Gillian Scarce cc Antony Blackbird, MD (PCP), [name], [facility] New patient consultation with Dr Sondra Come planned 02-07-16.  Outside records from Dr Chapman Fitch [facility]) and Dr Karsten Ro reviewed for this consultation and will be scanned into this EMR.  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 Staging with PET is pending.   Patient had gross hematuria May 2017, initially intermittent and then more continuously. She was seen by PCP, reports urine culture negative, then by Dr Karsten Ro. CT AP at [facility] had minimal left pleural thickening and atelectasis, heart ULN without pericardial effusion, liver/pancreas/spleen/adrenals normal, no urinary tract calculi, no renal lesions, large irregular cervical mass, endometrium thickened, ovaries normal, bilateral obturator nodes 10 mm, left external iliac node 11 mm, other small nodes.  Cystoscopy 01-23-16 found < 0.5 cm tumor posterior and lateral to left ureteral orifice, otherwise no abnormalities. Per Dr Karsten Ro, on visualization this appeared to be transitional cell neoplasm; biopsy is planned on 02-16-16.  She was seen by Dr Gillian Scarce on 01-31-16, with exam remarkable for necrotic tumor replacing cervix and extending to upper third of viagina, extending to right parametrium but not to sidewalls, no cervical, supraclavicular or inguinal adenopathy. Biopsy done 01-31-16 ([id number]) poorly differentiated carcinoma favor squamous cell. This is at least  IIIB due to right parametrial involvement, may be more advanced with PET and bladder biopsy pending.   REVIEW OF SYSTEMS Minimal cramping pelvic discomfort and spotting after cervical biopsy on 01-31-16, otherwise no pain. No bleeding other than the intermittent hematuria Stable weight, good appetite and good diet. No GERD. No nausea or vomiting.  Bowels move regularly, unchanged. No HA. Wears glasses, good visual acuity on right since recent cataract extraction, to have left cataract removed in future. Extensive dental work in past, no active problems, up to date on dental exams last 6 mo ago and to meet new dentist next week. No decreased hearing. No history of thyroid disease.  No respiratory symptoms No noted changes in breasts. Never mammograms No cardiac symptoms. HTN x 1 year, good since recent adjustment in medications several weeks ago. Does not check BP at home. No dysuria. No LE swelling. No history of blood clots. No peripheral neuropathy. No problems left hip since total hip replacement for femoral neck fracture 12-2014. Asymptomatic arthritis right hip   Remainder of full 10 point review of systems negative.   ALLERGIES: Sulfa antibiotics causes hives  PAST MEDICAL/ SURGICAL HISTORY:    G1 Right cataract extraction. Left expected in future HTN x 1 year on medication by PCP Left total hip 12-2014 for left femoral neck fracture in fall as she tried to walk neighbor's dog DEXA 08-2015 osteopenia Never mammograms Colonoscopy [facility] ~ 1 year ago "no polyps, no repeat colonoscopy needed" Iron deficiency anemia 12-2014 after hip surgery, resolved with oral iron, no transfusion.  CURRENT MEDICATIONS: reviewed as listed now in EMR. OK to continue all of present medications; she is to hold ASA prior to bladder biopsy. Will send prescriptions for zofran and compazine.  PHARMACY:  [facility]   SOCIAL HISTORY: originally from [place], [place], in [place] since  age 84. Widowed. 1 daughter lives in [place], granddaughter age 15 in [place] and grandson age 66 in [place]. 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 Remote minimal tobacco, DCd 1998. No transfusions. She enjoys playing cards and has a small dog. Daughter is college friend of Linward Foster   FAMILY HISTORY:  No gyn or breast cancer.     PHYSICAL EXAM:  height is _0  (1.651 m) and weight is 153 lb 9.6 oz (69.7 kg). Her oral temperature is 98.5 F (36.9 C). Her blood pressure is 163/79 (abnormal) and her pulse is 61. Her respiration is 18 and oxygen saturation is 96%.  Alert, pleasant, cooperative lady looks stated age, good historian. Daughter very supportive  HEENT: normal hair pattern, PERRL, not icteric. Oral mucosa moist and clear, posterior pharynx also. Neck supple without thyroid mass or JVD.  RESPIRATORY: lungs clear to A and P  CARDIAC/ VASCULAR: heart RRR no gallop  ABDOMEN: soft, not tender, not distended, normally active BS, no HSM  LYMPH NODES: no cervical, supraclavicular, axillary or inguinal adenopathy  BREASTS: bilaterally with minimal irregularities but no dominant mass, no skin or nipple findings of concern  NEUROLOGIC: speech fluent and appropriate. No peripheral neuropathy. CN, motor, sensory, cerebellar nonfocal. PSYCH appropriate mood and affect  SKIN: multiple seborrheic keratoses and cherry angiomas, no obvious skin lesions of concern Peripheral veins appear adequate for chemotherapy.  MUSCULOSKELETAL: back not tender. LE no edema cords tenderness.     LABORATORY DATA:  Results for orders placed or performed in visit on 02/02/16 (from the past 48 hour(s))  CBC with Differential     Status: None   Collection Time: 02/02/16  8:33 AM  Result Value Ref Range   WBC 8.4 3.9 - 10.3 10e3/uL   NEUT# 6.3 1.5 - 6.5 10e3/uL   HGB 13.6 11.6 - 15.9 g/dL   HCT 41.1 34.8 - 46.6 %   Platelets 296 145 - 400 10e3/uL   MCV 88.4 79.5 - 101.0 fL    MCH 29.2 25.1 - 34.0 pg   MCHC 33.0 31.5 - 36.0 g/dL   RBC 4.66 3.70 - 5.45 10e6/uL   RDW 13.5 11.2 - 14.5 %   lymph# 1.4 0.9 - 3.3 10e3/uL   MONO# 0.5 0.1 - 0.9 10e3/uL   Eosinophils Absolute 0.1 0.0 - 0.5 10e3/uL   Basophils Absolute 0.1 0.0 - 0.1 10e3/uL   NEUT% 75.5 38.4 - 76.8 %   LYMPH% 16.5 14.0 - 49.7 %   MONO% 6.2 0.0 - 14.0 %   EOS% 1.1 0.0 - 7.0 %   BASO% 0.7 0.0 - 2.0 %  Iron and TIBC     Status: Abnormal   Collection Time: 02/02/16  8:33 AM  Result Value Ref Range   Iron 76 41 - 142 ug/dL   TIBC 423 236 - 444 ug/dL   UIBC 346 120 - 384 ug/dL   %SAT 18 (L) 21 - 57 %  Ferritin     Status: None   Collection Time: 02/02/16  8:33 AM  Result Value Ref Range   Ferritin 18 9 - 269 ng/ml  Comprehensive metabolic panel     Status: Abnormal   Collection Time: 02/02/16  8:34 AM  Result Value Ref Range   Sodium 140 136 - 145 mEq/L   Potassium 3.8 3.5 - 5.1 mEq/L   Chloride 104 98 - 109  mEq/L   CO2 26 22 - 29 mEq/L   Glucose 101 70 - 140 mg/dl    Comment: Glucose reference range is for nonfasting patients. Fasting glucose reference range is 70- 100.   BUN 15.1 7.0 - 26.0 mg/dL   Creatinine 0.8 0.6 - 1.1 mg/dL   Total Bilirubin 0.41 0.20 - 1.20 mg/dL   Alkaline Phosphatase 95 40 - 150 U/L   AST 14 5 - 34 U/L   ALT 11 0 - 55 U/L   Total Protein 7.6 6.4 - 8.3 g/dL   Albumin 4.1 3.5 - 5.0 g/dL   Calcium 9.9 8.4 - 10.4 mg/dL   Anion Gap 10 3 - 11 mEq/L   EGFR 70 (L) >90 ml/min/1.73 m2    Comment: eGFR is calculated using the CKD-EPI Creatinine Equation (2009)  Magnesium     Status: Abnormal   Collection Time: 02/02/16  8:34 AM  Result Value Ref Range   Magnesium 2.6 (H) 1.5 - 2.5 mg/dl  Above labs reviewed with patient at time of viist.    PATHOLOGY: CIANI, RUTTEN S Collected: 01/31/2016 Client: [facility] Accession: [id number] Received: 01/31/2016 Gillian Scarce, MD DOB: 1935-07-19 Age: 80 Gender: F Reported: 02/01/2016 [place] Patient  Ph: [phone] MRN #: [id number] [place], [place] [address] Visit #: [id number] Chart #: Phone: Fax: CC: Melissa D. Cross, NP REPORT OF SURGICAL PATHOLOGY FINAL DIAGNOSIS Diagnosis Cervix, biopsy, ectocervix - INVASIVE POORLY DIFFERENTIATED CARCINOMA. - SEE MICROSCOPIC DESCRIPTION. Microscopic Comment The features strongly favor invasive squamous cell carcinoma.  RADIOGRAPHY: Outside CT AP 01-19-16 ([facility]) findings as above, to be scanned into this EMR  PET scheduled 02-06-16.  DISCUSSION: All of above history reviewed with patient and daughter. I have explained rationale for sensitizing chemotherapy with radiation, and have mentioned use of other systemic chemotherapy depending on other staging information pending. We have discussed general chemotherapy side effects, necessary hydration with CDDP, coordination of weekly CDDP with radiation. She and daughter will attend chemotherapy education class on 02-06-16, and are aware of appointments for PET 8-8, Dr Sondra Come 8-9 and bladder excisional biopsy by Dr Karsten Ro on 02-16-16. Chemotherapy and follow up MD appointments at this office will be confirmed when other information is available, including radiation oncology schedule.   Patient gives verbal consent for sensitizing CDDP with radiation.    IMPRESSION / PLAN:  1.poorly differentiated squamous cell carcinoma of cervix: at least IIIB, with PET and bladder biopsy pending. Likely will treat with sensitizing CDDP with radiation.  Further appointments with medical oncology to be set up when treatment plan finalized. Chemotherapy education class 02-06-16. 2.Lesion in bladder: Dr Karsten Ro thinks likely transitional cell carcinoma, biopsy confirmation pending 02-16-16. This seems to be cause of presenting hematuria. 3.HTN on medication by PCP 4.post left total hip replacement 12-2014 after femoral neck fracture in fall. 5.osteopenia by DEXA 2016. Discussed weight bearing exercise 6.past  minimal tobacco, DCd 1998 7.recent right cataract extraction, doing well 8.never mammograms, which we will address when possible 9.up to date on colonoscopy, no polyps, no further exams planned. 10. Anemia around hip surgery resolved. Not presently on oral iron   Patient and daughter have had questions answered to their satisfaction and are in agreement with plan above. They can contact this office for questions or concerns at any time prior to next scheduled visit. Chemo orders placed for weekly CDDP and for granix if needed, to allow preauthorization.  Message to radiation oncology and collaborative RNs for coordination of scheduling.  Time spent  45 min, including >50% discussion and coordination of care. Route Dr Chapman Fitch, cc Drs Carlis Abbott, Karsten Ro, [place]   Evlyn Clines, MD 02/02/2016 1:25 PM

## 2016-02-05 ENCOUNTER — Telehealth: Payer: Self-pay | Admitting: *Deleted

## 2016-02-05 NOTE — Telephone Encounter (Signed)
Per staff message and POF I have scheduled appts. Advised scheduler of appts. And to move lab appts  JMW

## 2016-02-06 ENCOUNTER — Encounter (HOSPITAL_COMMUNITY)
Admission: RE | Admit: 2016-02-06 | Discharge: 2016-02-06 | Disposition: A | Discharge status: Home / Self Care | Payer: Commercial Managed Care - HMO | Source: Ambulatory Visit | Attending: Gynecologic Oncology | Admitting: Gynecologic Oncology

## 2016-02-06 ENCOUNTER — Ambulatory Visit: Payer: Commercial Managed Care - HMO

## 2016-02-06 ENCOUNTER — Encounter: Payer: Self-pay | Admitting: *Deleted

## 2016-02-06 DIAGNOSIS — C539 Malignant neoplasm of cervix uteri, unspecified: Secondary | ICD-10-CM | POA: Diagnosis not present

## 2016-02-06 DIAGNOSIS — C760 Malignant neoplasm of head, face and neck: Secondary | ICD-10-CM | POA: Diagnosis not present

## 2016-02-06 LAB — GLUCOSE, CAPILLARY: Glucose-Capillary: 106 mg/dL — ABNORMAL HIGH (ref 65–99)

## 2016-02-06 MED ORDER — FLUDEOXYGLUCOSE F - 18 (FDG) INJECTION: 8.3000 | Freq: Once | INTRAVENOUS | Status: DC | PRN

## 2016-02-07 ENCOUNTER — Ambulatory Visit
Admission: RE | Admit: 2016-02-07 | Discharge: 2016-02-07 | Disposition: A | Discharge status: Home / Self Care | Payer: Commercial Managed Care - HMO | Source: Ambulatory Visit | Attending: Radiation Oncology | Admitting: Radiation Oncology

## 2016-02-07 ENCOUNTER — Encounter: Payer: Self-pay | Admitting: Radiation Oncology

## 2016-02-07 DIAGNOSIS — C539 Malignant neoplasm of cervix uteri, unspecified: Secondary | ICD-10-CM | POA: Insufficient documentation

## 2016-02-07 DIAGNOSIS — Z510 Encounter for antineoplastic radiation therapy: Secondary | ICD-10-CM | POA: Diagnosis not present

## 2016-02-07 DIAGNOSIS — Z96642 Presence of left artificial hip joint: Secondary | ICD-10-CM | POA: Diagnosis not present

## 2016-02-07 DIAGNOSIS — C531 Malignant neoplasm of exocervix: Secondary | ICD-10-CM

## 2016-02-07 DIAGNOSIS — C7989 Secondary malignant neoplasm of other specified sites: Secondary | ICD-10-CM | POA: Insufficient documentation

## 2016-02-07 DIAGNOSIS — I10 Essential (primary) hypertension: Secondary | ICD-10-CM | POA: Insufficient documentation

## 2016-02-07 DIAGNOSIS — Z79899 Other long term (current) drug therapy: Secondary | ICD-10-CM | POA: Insufficient documentation

## 2016-02-07 DIAGNOSIS — Z9889 Other specified postprocedural states: Secondary | ICD-10-CM | POA: Insufficient documentation

## 2016-02-07 DIAGNOSIS — Z888 Allergy status to other drugs, medicaments and biological substances status: Secondary | ICD-10-CM | POA: Diagnosis not present

## 2016-02-07 DIAGNOSIS — Z808 Family history of malignant neoplasm of other organs or systems: Secondary | ICD-10-CM | POA: Insufficient documentation

## 2016-02-07 DIAGNOSIS — Z7982 Long term (current) use of aspirin: Secondary | ICD-10-CM | POA: Insufficient documentation

## 2016-02-07 HISTORY — DX: Malignant neoplasm of cervix uteri, unspecified: C53.9

## 2016-02-07 NOTE — Progress Notes (Signed)
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 female who is seen out courtesy of Dr. Gillian Scarce for an opinion concerning radiation therapy as part of management of patient's recently diagnosed locally advanced squamous cell carcinoma of the cervix.. The patient presented to Dr. Karsten Ro upon referral from Dr. Chapman Fitch for evaluation of hematuria. As part of this evaluation she underwent a CT scan and cystoscopy. She was noted to have concern for a cervical/uterine mass on CT with some mildly enlarged pelvic lymph nodes. Cystoscopy revealed a <1/2cm bladder tumor, which was not biopsied at the time but is planned for future resection per the patient's report. Patient was referred to Dr. Carlis Abbott and on pelvic examination the patient was noted to have a tumor which had replaced the cervix with extension into the right parametria on clinical examination. PET scan as documented below shows bilateral external iliac nodal metastasis. Patient has been seen by Dr. Marko Plume for consideration for chemosensitization.  PREVIOUS RADIATION THERAPY: No  PAST MEDICAL HISTORY:  has a past medical history of Anemia; Cataract; Cervical cancer ([place]); and Hypertension.    PAST SURGICAL HISTORY: Past Surgical History:  Procedure Laterality Date  . ANKLE SURGERY Left 2006  . cataract Right 10/2015  . TOTAL HIP ARTHROPLASTY Left 12/28/2014   Procedure: TOTAL HIP ARTHROPLASTY ANTERIOR APPROACH;  Surgeon: Leandrew Koyanagi, MD;  Location: [facility];  Service: Orthopedics;  Laterality: Left;    FAMILY HISTORY: family history  includes Cancer in her maternal aunt; Melanoma in her brother.  SOCIAL HISTORY:  reports that she quit smoking about 19 years ago. She has never used smokeless tobacco. She reports that she drinks about 2.4 oz of alcohol per week . She reports that she does not use drugs.  ALLERGIES: Sulfa antibiotics  MEDICATIONS:  Current Outpatient Prescriptions  Medication Sig Dispense Refill  . acetaminophen (TYLENOL) 325 MG tablet Take 650 mg by mouth every 6 (six) hours as needed.    Marland Kitchen amlodipine-benazepril (LOTREL) 2.5-10 MG per capsule Take 1 capsule by mouth daily. Patient is taking 5 MG PO daily    . Calcium Carbonate-Vit D-Min (GNP CALCIUM 1200) 1200-1000 MG-UNIT CHEW Chew 1 tablet by mouth daily.    . Multiple Vitamins-Minerals (MULTIVITAMIN WITH MINERALS) tablet Take 1 tablet by mouth daily.    Marland Kitchen aspirin 81 MG tablet Take 81 mg by mouth daily.     No current facility-administered medications for this encounter.    Facility-Administered Medications Ordered in Other Encounters  Medication Dose Route Frequency Provider Last Rate Last Dose  . fludeoxyglucose F - 18 (FDG) injection 8.3 millicurie  8.3 millicurie Intravenous Once PRN Inez Catalina, MD        REVIEW OF SYSTEMS:  A 15 point review of systems is documented in the electronic medical record. This was obtained by the nursing staff. However, I reviewed this with the patient to discuss relevant findings and make appropriate changes.  She reports noticing bleeding beginning in May 2017. She reports some vaginal discharge versus urinary leakage, which has been ongoing for a number of years. She requires 4-5 panty liners per day for this.  She denies a history of dysplasia, but has not had pap screening in >10 years. She denies changes in bowel or bladder function. She denies weight loss or night sweats.    PHYSICAL EXAM:  height is 5' 4.5" (1.638 m) and weight is 154 lb 14.4 oz (70.3 kg). Her oral temperature is 97.8 F (36.6 C). Her blood  pressure is 149/78 (abnormal) and her pulse is 73. Her oxygen saturation is 100%.   General: Alert and oriented, in no acute distress HEENT: Head is normocephalic. Extraocular movements are intact. Oropharynx is clear. Neck: Neck is supple, no palpable cervical or supraclavicular lymphadenopathy. Heart: Regular in rate and rhythm with no murmurs, rubs, or gallops. Chest: Clear to auscultation bilaterally, with no rhonchi, wheezes, or rales. Abdomen: Soft, nontender, nondistended, with no rigidity or guarding. Extremities: No cyanosis or edema. Lymphatics: see Neck Exam Skin: No concerning lesions. Musculoskeletal: symmetric strength and muscle tone throughout. Neurologic: Cranial nerves II through XII are grossly intact. No obvious focalities. Speech is fluent. Coordination is intact. Psychiatric: Judgment and insight are intact. Affect is appropriate. Pelvic examination the external genitalia are unremarkable. A speculum exam is performed. There is necrotic tumor noted in the proximal vagina. On bimanual examination the cervix is replaced by tumor and protrudes into the upper vaginal vault. There does appear to be right parametrial extension on rectovaginal examination. The cervical mass is estimated to be approximately 5 x 6 cm.     ECOG = 1  LABORATORY DATA:  Lab Results  Component Value Date   WBC 8.4 02/02/2016   HGB 13.6 02/02/2016   HCT 41.1 02/02/2016   MCV 88.4 02/02/2016   PLT 296 02/02/2016   NEUTROABS 6.3 02/02/2016   Lab Results  Component Value Date   NA 140 02/02/2016   K 3.8 02/02/2016   CL 104 12/30/2014   CO2 26 02/02/2016   GLUCOSE 101 02/02/2016   CREATININE 0.8 02/02/2016   CALCIUM 9.9 02/02/2016      RADIOGRAPHY: Nm Pet Image Initial (pi) Skull Base To Thigh  Result Date: 02/06/2016 CLINICAL DATA:  Initial treatment strategy for cervical carcinoma. EXAM: NUCLEAR MEDICINE PET SKULL BASE TO THIGH TECHNIQUE: 8.3 mCi F-18 FDG was injected intravenously.  Full-ring PET imaging was performed from the skull base to thigh after the radiotracer. CT data was obtained and used for attenuation correction and anatomic localization. FASTING BLOOD GLUCOSE:  Value: 106 mg/dl COMPARISON:  CT on 01/19/2016 FINDINGS: NECK No hypermetabolic lymph nodes in the neck. CHEST No hypermetabolic mediastinal or hilar nodes. No suspicious pulmonary nodules on the CT scan. Coronary artery calcification noted. ABDOMEN/PELVIS No abnormal hypermetabolic activity within the liver, pancreas, adrenal glands, or spleen. A hypermetabolic mass is seen centered in region of the uterine cervix. This measures 5.4 x 6.4 cm on image 165/4, and has an SUV max of 16.7. This is consistent with primary cervical carcinoma. Left external iliac lymph nodes are seen, measuring 10 mm on image 166/4 with SUV max of 2.4, and 8 mm on image 168/4 with SUV max of 3.0. A 10 mm right external iliac lymph node on image 167/4 also shows mild hyper metabolism with SUV max of 3.0. No hypermetabolic abdominal lymph nodes identified. Aortic atherosclerosis noted. SKELETON No focal hypermetabolic activity to suggest skeletal metastasis. Focal areas of hypermetabolic activity are seen in several left ribs which correspond with healing nonpathologic rib fractures. IMPRESSION: Hypermetabolic cervical mass, consistent with known primary cervical carcinoma. Mild hypermetabolic bilateral external iliac lymphadenopathy, suspicious for metastatic  disease. No evidence of metastatic disease within the abdomen, chest, or neck. Healing benign-appearing left rib fractures. Aortic atherosclerosis and coronary artery calcification incidentally noted. Electronically Signed   By: Earle Gell M.D.   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  Patient would be a good candidate for a definitive course of radiation therapy along with  radiosensitizing chemotherapy. The patient's radiation treatment would also include intracavitary high-dose rate brachytherapy treatments as part of her overall management. I discussed the treatment course side effects and potential toxicities of radiation therapy with the patient and her daughter. The patient appears to understand and wishes to proceed with planned course of treatment  PLAN: The patient will proceed with scheduling of her simulation for treatment after discussion with Dr. Karsten Ro concerning when the patient can proceed with her treatments. She is scheduled for cystoscopy and fulguration of her bladder lesion late next week.     ------------------------------------------------  Blair Promise, PhD, MD

## 2016-02-07 NOTE — Progress Notes (Signed)
Please see the Nurse Progress Note in the MD Initial Consult Encounter for this patient. 

## 2016-02-09 ENCOUNTER — Encounter (HOSPITAL_BASED_OUTPATIENT_CLINIC_OR_DEPARTMENT_OTHER): Payer: Self-pay | Admitting: *Deleted

## 2016-02-13 ENCOUNTER — Encounter (HOSPITAL_BASED_OUTPATIENT_CLINIC_OR_DEPARTMENT_OTHER): Payer: Self-pay | Admitting: *Deleted

## 2016-02-13 NOTE — Progress Notes (Signed)
NPO AFTER MN WITH EXCEPTION CLEAR LIQUIDS UNTIL 0700 (NO CREAM/ MILK PRODUCTS).  ARRIVE AT 1100.  CURRENT LAB RESULTS IN CHART AND EPIC.  NEEDS EKG.

## 2016-02-14 ENCOUNTER — Telehealth: Payer: Self-pay | Admitting: *Deleted

## 2016-02-14 ENCOUNTER — Telehealth: Payer: Self-pay

## 2016-02-14 ENCOUNTER — Encounter: Payer: Self-pay | Admitting: Oncology

## 2016-02-14 DIAGNOSIS — C539 Malignant neoplasm of cervix uteri, unspecified: Secondary | ICD-10-CM

## 2016-02-14 MED ORDER — PROCHLORPERAZINE MALEATE 10 MG PO TABS: 10.0000 mg | ORAL_TABLET | Freq: Four times a day (QID) | ORAL | 0 refills | Status: AC | PRN

## 2016-02-14 MED ORDER — ONDANSETRON HCL 8 MG PO TABS: 8.0000 mg | ORAL_TABLET | Freq: Three times a day (TID) | ORAL | 0 refills | Status: DC | PRN

## 2016-02-14 NOTE — Telephone Encounter (Signed)
Santiago Glad spoke with Dr. Sondra Come and confirmed that radiation would not begin on 02-19-16 so that appointment can be cancelled. Spoke with [facility] in scheduling to have 1st CDDP and lab scheduled on 02-26-16 and cancel 02-19-16.  Antiemetics still  need to be sent to patient's pharmacy  prior to starting chemotherapy treatments.

## 2016-02-14 NOTE — Progress Notes (Signed)
Medical Oncology  Scheduling message sent:   LL x 30 min + lab 8-24 CDDP 8-28 CDDP + lab 9-5  L.Marko Plume, MD

## 2016-02-14 NOTE — Telephone Encounter (Signed)
Per staff message I have scheduled appt. Scheduler notified

## 2016-02-14 NOTE — Telephone Encounter (Signed)
-----   Message from Gordy Levan, MD sent at 02/14/2016 12:10 PM EDT ----- Please send in zofran 8 mg   q 8 hr prn nausea #30 And compazine 10 mg q 6 hr prn nausea #20  Please request 8-18 note from [facility] early next week  I am sending scheduling request for visit with me 8-24 + lab and for CDDP 8-28 and 9-5 + lab   thanks

## 2016-02-15 ENCOUNTER — Ambulatory Visit
Admission: RE | Admit: 2016-02-15 | Discharge: 2016-02-15 | Disposition: A | Discharge status: Home / Self Care | Payer: Commercial Managed Care - HMO | Source: Ambulatory Visit | Attending: Radiation Oncology | Admitting: Radiation Oncology

## 2016-02-15 ENCOUNTER — Encounter: Payer: Self-pay | Admitting: Radiation Oncology

## 2016-02-15 VITALS — BP 145/81 | HR 81 | Temp 98.1°F | Resp 12 | Wt 149.8 lb

## 2016-02-15 DIAGNOSIS — Z79899 Other long term (current) drug therapy: Secondary | ICD-10-CM | POA: Diagnosis not present

## 2016-02-15 DIAGNOSIS — Z9889 Other specified postprocedural states: Secondary | ICD-10-CM | POA: Diagnosis not present

## 2016-02-15 DIAGNOSIS — Z888 Allergy status to other drugs, medicaments and biological substances status: Secondary | ICD-10-CM | POA: Diagnosis not present

## 2016-02-15 DIAGNOSIS — C539 Malignant neoplasm of cervix uteri, unspecified: Secondary | ICD-10-CM | POA: Diagnosis not present

## 2016-02-15 DIAGNOSIS — C531 Malignant neoplasm of exocervix: Secondary | ICD-10-CM | POA: Diagnosis not present

## 2016-02-15 DIAGNOSIS — Z510 Encounter for antineoplastic radiation therapy: Secondary | ICD-10-CM | POA: Diagnosis not present

## 2016-02-15 DIAGNOSIS — I10 Essential (primary) hypertension: Secondary | ICD-10-CM | POA: Diagnosis not present

## 2016-02-15 DIAGNOSIS — C7989 Secondary malignant neoplasm of other specified sites: Secondary | ICD-10-CM | POA: Diagnosis not present

## 2016-02-15 DIAGNOSIS — Z96642 Presence of left artificial hip joint: Secondary | ICD-10-CM | POA: Diagnosis not present

## 2016-02-15 DIAGNOSIS — Z808 Family history of malignant neoplasm of other organs or systems: Secondary | ICD-10-CM | POA: Diagnosis not present

## 2016-02-15 MED ORDER — SODIUM CHLORIDE 0.9% FLUSH
10.0000 mL | INTRAVENOUS | Status: DC | PRN
  Administered 2016-02-15: 10 mL via INTRAVENOUS

## 2016-02-15 NOTE — Progress Notes (Signed)
  Radiation Oncology         [phone] ________________________________  Name: Sherri Mendez MRN: [id number]  Date: 02/15/2016  DOB: 30-Apr-1936  SIMULATION AND TREATMENT PLANNING NOTE    ICD-9-CM ICD-10-CM   1. 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  Identity was confirmed.  All relevant records and images related to the planned course of therapy were reviewed.  The patient freely provided informed written consent to proceed with treatment after reviewing the details related to the planned course of therapy. The consent form was witnessed and verified by the simulation staff.  Then, the patient was set-up in a stable reproducible  supine position for radiation therapy.  CT images were obtained.  Surface markings were placed.  The CT images were loaded into the planning software.  Then the target and avoidance structures were contoured.  Treatment planning then occurred.  The radiation prescription was entered and confirmed.  Then, I designed and supervised the construction of a total of 5 medically necessary complex treatment devices.  I have requested : 3D Simulation  I have requested a DVH of the following structures: Cervix, uterus, CTV, PTV, bladder, rectum, bowel.  I have ordered:CBC  PLAN:  The patient will receive 45 Gy in 25 fractions followed by a sidewall/nodal boost of 9-14.4 gray for cumulative external beam dose of 54-59.4.  Patient will then proceed with 5 intracavitary brachytherapy treatments with iridium 192 as the high-dose-rate source.   Special Treatment Procedure Note: The patient will be receiving radiosensitizing chemotherapy. Given the potential of increased toxicities related to combined therapy and the necessity for close monitoring of the  patient and blood work, this constitutes a special treatment procedure.  -----------------------------------  Blair Promise, PhD, MD  This document serves as a record of services personally performed by Gery Pray, MD. It was created on his behalf by Darcus Austin, a trained medical scribe. The creation of this record is based on the scribe's personal observations and the provider's statements to them. This document has been checked and approved by the attending provider.

## 2016-02-15 NOTE — H&P (Signed)
HPI: Sherri Mendez is a 80 year-old female established patient who is here for further evaluation of hematuria.  She has not seen blood in her urine. He denies any other associated symptoms.   She is not having pain. Her last U/S or CT Scan was 01/19/2016.   She presents today for completion of her workup of microscopic hematuria and reports no new voiding complaints, gross hematuria or other urologic difficulties.     ALLERGIES: Sulfa - Skin Rash, Swelling    MEDICATIONS: Amlodipine Besylate 5 mg tablet  Aspirin Ec 325 mg tablet, delayed release  Calcium + Vit D  Ferrous Sulfate 325 mg (65 mg iron) tablet     GU PSH: Locm 300-'399Mg'$ /Ml Iodine,1Ml - 01/19/2016    NON-GU PSH: Ankle Arthroscopy/surgery, Left, 2006 Cataract Surgery, Right Hip Replacement, Left - 12/28/2014    GU PMH: Asymptomatic microscopic hematuria, She has microscopic hematuria that warrants a full evaluation especially with the presence of voiding symptoms and I have discusseda full workup with her today. - 12/28/2015 Urge incontinence, she has mild urge incontinence. We discussed managing this pharmacologically once I have fully evaluated her urinary tract. - 12/28/2015    NON-GU PMH: Essential (primary) hypertension Primary osteoarthritis, unspecified site    FAMILY HISTORY: 1 Daughter - Daughter   SOCIAL HISTORY: Marital Status: Widowed Current Smoking Status: Patient has never smoked.  Does drink.  Drinks 1 caffeinated drink per day. Patient's occupation is/was Retired.    REVIEW OF SYSTEMS:    GU Review Female:   Patient reports frequent urination, hard to postpone urination, get up at night to urinate, and leakage of urine. Patient denies burning /pain with urination, stream starts and stops, trouble starting your stream, have to strain to urinate, and currently pregnant.  Gastrointestinal (Upper):   Patient denies nausea, vomiting, and indigestion/ heartburn.  Gastrointestinal (Lower):   Patient  denies diarrhea and constipation.  Constitutional:   Patient denies fever, night sweats, weight loss, and fatigue.  Skin:   Patient denies skin rash/ lesion and itching.  Eyes:   Patient denies blurred vision and double vision.  Ears/ Nose/ Throat:   Patient denies sore throat and sinus problems.  Hematologic/Lymphatic:   Patient denies swollen glands and easy bruising.  Cardiovascular:   Patient denies leg swelling and chest pains.  Respiratory:   Patient denies cough and shortness of breath.  Endocrine:   Patient denies excessive thirst.  Musculoskeletal:   Patient denies back pain and joint pain.  Neurological:   Patient denies headaches and dizziness.  Psychologic:   Patient denies depression and anxiety.   VITAL SIGNS:       Weight 155 lb / 70.31 kg  Height 64 in / 162.56 cm  BP 162/80 mmHg  Pulse 86 /min  BMI 26.6 kg/m   MULTI-SYSTEM PHYSICAL EXAMINATION:    Constitutional: Well-nourished. No physical deformities. Normally developed. Good grooming.  Neck: Neck symmetrical, not swollen. Normal tracheal position.  Respiratory: No labored breathing, no use of accessory muscles.   Cardiovascular: Normal temperature, normal extremity pulses, no swelling, no varicosities.  Lymphatic: No enlargement of neck, axillae, groin.  Skin: No paleness, no jaundice, no cyanosis. No lesion, no ulcer, no rash.  Neurologic / Psychiatric: Oriented to time, oriented to place, oriented to person. No depression, no anxiety, no agitation.  Gastrointestinal: No mass, no tenderness, no rigidity, non obese abdomen.  Eyes: Normal conjunctivae. Normal eyelids.  Ears, Nose, Mouth, and Throat: Left ear no scars, no lesions, no masses. Right ear  no scars, no lesions, no masses. Nose no scars, no lesions, no masses. Normal hearing. Normal lips.  Musculoskeletal: Normal gait and station of head and neck.    GU PHYSICAL EXAMINATION:    Urethra: No tenderness, no mass, no scarring. No hypermobility. No  leakage.  Bladder: Normal to palpation, no tenderness, no mass, normal size.  Vagina: No atrophy, no stenosis. No rectocele. No cystocele. No enterocele.  Cervix: Cervix as a mass associated with it anteriorly and is nodular and firm.   MULTI-SYSTEM PHYSICAL EXAMINATION:       PAST DATA REVIEWED:  Source Of History:  Patient   PROCEDURES:         Flexible Cystoscopy - 52000  Risks, benefits, and some of the potential complications of the procedure were discussed at length with the patient including infection, bleeding, voiding discomfort and others. All questions were answered. Sterile technique and intraurethral analgesia were used.  Meatus:  Normal size. Normal location. Normal condition.  Urethra:  No hypermobility. No leakage.  Ureteral Orifices:  Normal location. Normal size. Normal shape. Effluxed clear urine.  Bladder:  < 1/2 cm tumor. Solitary tumor. No trabeculation. Normal mucosa. No stones.The tumor is papillary in configuration and located posterior and lateral to her left ureteral orifice.      The lower urinary tract was carefully examined. The procedure was well-tolerated and without complications. Instructions were given to call the office immediately for bloody urine, difficulty urinating, urinary retention, painful or frequent urination, fever or other illness. The patient stated that she understood these instructions and would comply with them.   ASSESSMENT:      ICD-10 Details  1 GU:   Asymptomatic microscopic hematuria - R31.21 I have discussed with the patient the fact that the CT scan has revealed no abnormality of the upper tract that could contribute to the presence of microscopic hematuria and cystoscopically no abnormality of the lower tract was identified. In this case current recommendations are that the patient be followed for microscopic hematuria for 3 - 5 years with urinalysis and serum creatinine on a yearly basis. If the urine remains positive for  microscopic hematuria and the patient develops new hypertension, proteinuria or blood cell casts on urinalysis evaluation for primary renal disease should be undertaken. If, on the other hand, microscopic hematuria persists in the absence of any findings to suggest primary renal disease, then repeat urologic evaluation should be considered. In addition if the patient has 2 consecutive negative urinalyses yearly over a two-year period then no further urinalyses for the purpose of evaluation of asymptomatic microscopic hematuria are necessary.  2   Malig Neo Cervix Weyman Rodney - C53.9 she has what appears to be worrisome abnormalities of the cervix and uterus concerning for malignancy on her CT scan and on her exam I am fairly certain this is a malignant neoplasm.  This has been confirmed to be stage IIb poorly differentiated squamous cell carcinoma of the cervix with metastases to the pelvic lymph nodes and local extension.  PLAN:    Cystoscopy with transurethral resection/biopsy of her bladder lesion.

## 2016-02-15 NOTE — Progress Notes (Signed)
Does patient have an allergy to IV contrast dye?: No.   Has patient ever received premedication for IV contrast dye?: No   Does patient take metformin?: No.  If patient does take metformin when was the last dose: No  Date of lab work: February 02, 2016 BUN: 15.0 CR: 0.8  IV site: antecubital right, condition patent and no redness

## 2016-02-16 ENCOUNTER — Encounter (HOSPITAL_BASED_OUTPATIENT_CLINIC_OR_DEPARTMENT_OTHER): Payer: Self-pay | Admitting: Anesthesiology

## 2016-02-16 ENCOUNTER — Ambulatory Visit (HOSPITAL_BASED_OUTPATIENT_CLINIC_OR_DEPARTMENT_OTHER): Payer: Commercial Managed Care - HMO | Admitting: Anesthesiology

## 2016-02-16 ENCOUNTER — Encounter (HOSPITAL_BASED_OUTPATIENT_CLINIC_OR_DEPARTMENT_OTHER)
Admission: RE | Disposition: A | Discharge status: Home / Self Care | Payer: Self-pay | Source: Ambulatory Visit | Attending: Urology

## 2016-02-16 ENCOUNTER — Telehealth: Payer: Self-pay | Admitting: Oncology

## 2016-02-16 ENCOUNTER — Ambulatory Visit (HOSPITAL_BASED_OUTPATIENT_CLINIC_OR_DEPARTMENT_OTHER)
Admission: RE | Admit: 2016-02-16 | Discharge: 2016-02-16 | Disposition: A | Discharge status: Home / Self Care | Payer: Commercial Managed Care - HMO | Source: Ambulatory Visit | Attending: Urology | Admitting: Urology

## 2016-02-16 DIAGNOSIS — C679 Malignant neoplasm of bladder, unspecified: Secondary | ICD-10-CM | POA: Diagnosis not present

## 2016-02-16 DIAGNOSIS — C775 Secondary and unspecified malignant neoplasm of intrapelvic lymph nodes: Secondary | ICD-10-CM | POA: Insufficient documentation

## 2016-02-16 DIAGNOSIS — D494 Neoplasm of unspecified behavior of bladder: Secondary | ICD-10-CM

## 2016-02-16 DIAGNOSIS — Z96642 Presence of left artificial hip joint: Secondary | ICD-10-CM | POA: Insufficient documentation

## 2016-02-16 DIAGNOSIS — Z79899 Other long term (current) drug therapy: Secondary | ICD-10-CM | POA: Diagnosis not present

## 2016-02-16 DIAGNOSIS — Z7982 Long term (current) use of aspirin: Secondary | ICD-10-CM | POA: Diagnosis not present

## 2016-02-16 DIAGNOSIS — R310 Gross hematuria: Secondary | ICD-10-CM | POA: Diagnosis not present

## 2016-02-16 DIAGNOSIS — R3121 Asymptomatic microscopic hematuria: Secondary | ICD-10-CM | POA: Diagnosis present

## 2016-02-16 DIAGNOSIS — I10 Essential (primary) hypertension: Secondary | ICD-10-CM | POA: Insufficient documentation

## 2016-02-16 DIAGNOSIS — C539 Malignant neoplasm of cervix uteri, unspecified: Secondary | ICD-10-CM | POA: Insufficient documentation

## 2016-02-16 DIAGNOSIS — N3941 Urge incontinence: Secondary | ICD-10-CM | POA: Diagnosis not present

## 2016-02-16 DIAGNOSIS — Z87891 Personal history of nicotine dependence: Secondary | ICD-10-CM | POA: Insufficient documentation

## 2016-02-16 DIAGNOSIS — C672 Malignant neoplasm of lateral wall of bladder: Secondary | ICD-10-CM | POA: Insufficient documentation

## 2016-02-16 DIAGNOSIS — D649 Anemia, unspecified: Secondary | ICD-10-CM | POA: Diagnosis not present

## 2016-02-16 HISTORY — PX: CYSTOSCOPY WITH BIOPSY: SHX5122

## 2016-02-16 HISTORY — DX: Personal history of (healed) traumatic fracture: Z87.81

## 2016-02-16 HISTORY — DX: Presence of spectacles and contact lenses: Z97.3

## 2016-02-16 HISTORY — DX: Other specified disorders of bone density and structure, unspecified site: M85.80

## 2016-02-16 HISTORY — DX: Unspecified symptoms and signs involving the genitourinary system: R39.9

## 2016-02-16 SURGERY — CYSTOSCOPY, WITH BIOPSY
Anesthesia: General | Site: Bladder

## 2016-02-16 MED ORDER — MIDAZOLAM HCL 2 MG/2ML IJ SOLN
INTRAMUSCULAR | Status: AC
  Filled 2016-02-16: qty 2

## 2016-02-16 MED ORDER — DEXAMETHASONE SODIUM PHOSPHATE 10 MG/ML IJ SOLN
INTRAMUSCULAR | Status: AC
Start: 2016-02-16 — End: 2016-02-16
  Filled 2016-02-16: qty 1

## 2016-02-16 MED ORDER — PROPOFOL 10 MG/ML IV BOLUS
INTRAVENOUS | Status: DC | PRN
  Administered 2016-02-16: 150 mg via INTRAVENOUS

## 2016-02-16 MED ORDER — PROPOFOL 10 MG/ML IV BOLUS
INTRAVENOUS | Status: AC
  Filled 2016-02-16: qty 20

## 2016-02-16 MED ORDER — DEXAMETHASONE SODIUM PHOSPHATE 4 MG/ML IJ SOLN
INTRAMUSCULAR | Status: DC | PRN
  Administered 2016-02-16: 8 mg via INTRAVENOUS

## 2016-02-16 MED ORDER — LACTATED RINGERS IV SOLN
INTRAVENOUS | Status: DC
  Administered 2016-02-16: 12:00:00 via INTRAVENOUS
  Filled 2016-02-16: qty 1000

## 2016-02-16 MED ORDER — PHENAZOPYRIDINE HCL 200 MG PO TABS: 200.0000 mg | ORAL_TABLET | Freq: Three times a day (TID) | ORAL | 0 refills | Status: DC | PRN

## 2016-02-16 MED ORDER — CIPROFLOXACIN IN D5W 200 MG/100ML IV SOLN
INTRAVENOUS | Status: AC
  Filled 2016-02-16: qty 100

## 2016-02-16 MED ORDER — KETOROLAC TROMETHAMINE 30 MG/ML IJ SOLN
INTRAMUSCULAR | Status: AC
  Filled 2016-02-16: qty 1

## 2016-02-16 MED ORDER — FENTANYL CITRATE (PF) 100 MCG/2ML IJ SOLN
INTRAMUSCULAR | Status: DC | PRN
  Administered 2016-02-16 (×2): 25 ug via INTRAVENOUS

## 2016-02-16 MED ORDER — MEPERIDINE HCL 25 MG/ML IJ SOLN
6.2500 mg | INTRAMUSCULAR | Status: DC | PRN
  Filled 2016-02-16: qty 1

## 2016-02-16 MED ORDER — KETOROLAC TROMETHAMINE 15 MG/ML IJ SOLN
15.0000 mg | Freq: Once | INTRAMUSCULAR | Status: AC
  Administered 2016-02-16: 15 mg via INTRAVENOUS
  Filled 2016-02-16: qty 1

## 2016-02-16 MED ORDER — PHENAZOPYRIDINE HCL 200 MG PO TABS
200.0000 mg | ORAL_TABLET | Freq: Once | ORAL | Status: AC
  Administered 2016-02-16: 200 mg via ORAL
  Filled 2016-02-16: qty 1

## 2016-02-16 MED ORDER — PHENAZOPYRIDINE HCL 100 MG PO TABS
ORAL_TABLET | ORAL | Status: AC
  Filled 2016-02-16: qty 2

## 2016-02-16 MED ORDER — STERILE WATER FOR IRRIGATION IR SOLN
Status: DC | PRN
  Administered 2016-02-16: 3000 mL

## 2016-02-16 MED ORDER — CIPROFLOXACIN IN D5W 200 MG/100ML IV SOLN
200.0000 mg | INTRAVENOUS | Status: AC
  Administered 2016-02-16: 200 mg via INTRAVENOUS
  Filled 2016-02-16: qty 100

## 2016-02-16 MED ORDER — FENTANYL CITRATE (PF) 100 MCG/2ML IJ SOLN
INTRAMUSCULAR | Status: AC
  Filled 2016-02-16: qty 2

## 2016-02-16 MED ORDER — MIDAZOLAM HCL 5 MG/5ML IJ SOLN
INTRAMUSCULAR | Status: DC | PRN
  Administered 2016-02-16: 0.5 mg via INTRAVENOUS

## 2016-02-16 MED ORDER — ONDANSETRON HCL 4 MG/2ML IJ SOLN
INTRAMUSCULAR | Status: AC
  Filled 2016-02-16: qty 2

## 2016-02-16 MED ORDER — HYDROCODONE-ACETAMINOPHEN 7.5-325 MG PO TABS
1.0000 | ORAL_TABLET | Freq: Once | ORAL | Status: DC | PRN
  Filled 2016-02-16: qty 1

## 2016-02-16 MED ORDER — ONDANSETRON HCL 4 MG/2ML IJ SOLN
INTRAMUSCULAR | Status: DC | PRN
  Administered 2016-02-16: 4 mg via INTRAVENOUS

## 2016-02-16 MED ORDER — FENTANYL CITRATE (PF) 100 MCG/2ML IJ SOLN
25.0000 ug | INTRAMUSCULAR | Status: DC | PRN
  Filled 2016-02-16: qty 1

## 2016-02-16 MED ORDER — METOCLOPRAMIDE HCL 5 MG/ML IJ SOLN
10.0000 mg | Freq: Once | INTRAMUSCULAR | Status: DC | PRN
  Filled 2016-02-16: qty 2

## 2016-02-16 MED ORDER — LIDOCAINE HCL (CARDIAC) 20 MG/ML IV SOLN
INTRAVENOUS | Status: AC
  Filled 2016-02-16: qty 5

## 2016-02-16 MED ORDER — LIDOCAINE HCL (CARDIAC) 20 MG/ML IV SOLN
INTRAVENOUS | Status: DC | PRN
  Administered 2016-02-16: 60 mg via INTRAVENOUS

## 2016-02-16 SURGICAL SUPPLY — 35 items
BAG DRAIN URO-CYSTO SKYTR STRL (DRAIN) ×3 IMPLANT
BAG URINE DRAINAGE (UROLOGICAL SUPPLIES) IMPLANT
BAG URINE LEG 19OZ MD ST LTX (BAG) IMPLANT
CATH FOLEY 2WAY SLVR  5CC 20FR (CATHETERS)
CATH FOLEY 2WAY SLVR  5CC 22FR (CATHETERS)
CATH FOLEY 2WAY SLVR  5CC 24FR (CATHETERS) ×1
CATH FOLEY 2WAY SLVR 5CC 20FR (CATHETERS) IMPLANT
CATH FOLEY 2WAY SLVR 5CC 22FR (CATHETERS) IMPLANT
CATH FOLEY 2WAY SLVR 5CC 24FR (CATHETERS) ×2 IMPLANT
CATH FOLEY 3WAY 20FR (CATHETERS) IMPLANT
CLOTH BEACON ORANGE TIMEOUT ST (SAFETY) ×3 IMPLANT
ELECT BIVAP BIPO 22/24 DONUT (ELECTROSURGICAL) ×3
ELECT LOOP MED HF 24F 12D (CUTTING LOOP) IMPLANT
ELECT REM PT RETURN 9FT ADLT (ELECTROSURGICAL) ×3
ELECTRD BIVAP BIPO 22/24 DONUT (ELECTROSURGICAL) ×2 IMPLANT
ELECTRODE REM PT RTRN 9FT ADLT (ELECTROSURGICAL) ×2 IMPLANT
EVACUATOR MICROVAS BLADDER (UROLOGICAL SUPPLIES) IMPLANT
GLOVE BIO SURGEON STRL SZ8 (GLOVE) ×3 IMPLANT
GLOVE BIOGEL PI IND STRL 7.0 (GLOVE) ×4 IMPLANT
GLOVE BIOGEL PI IND STRL 7.5 (GLOVE) ×2 IMPLANT
GLOVE BIOGEL PI INDICATOR 7.0 (GLOVE) ×2
GLOVE BIOGEL PI INDICATOR 7.5 (GLOVE) ×1
GOWN STRL REUS W/ TWL LRG LVL3 (GOWN DISPOSABLE) ×2 IMPLANT
GOWN STRL REUS W/ TWL XL LVL3 (GOWN DISPOSABLE) ×2 IMPLANT
GOWN STRL REUS W/TWL LRG LVL3 (GOWN DISPOSABLE) ×1
GOWN STRL REUS W/TWL XL LVL3 (GOWN DISPOSABLE) ×1
HOLDER FOLEY CATH W/STRAP (MISCELLANEOUS) IMPLANT
IV NS IRRIG 3000ML ARTHROMATIC (IV SOLUTION) IMPLANT
KIT ROOM TURNOVER WOR (KITS) ×3 IMPLANT
MANIFOLD NEPTUNE II (INSTRUMENTS) IMPLANT
PACK CYSTO (CUSTOM PROCEDURE TRAY) ×3 IMPLANT
PLUG CATH AND CAP STER (CATHETERS) IMPLANT
SET ASPIRATION TUBING (TUBING) IMPLANT
TUBE CONNECTING 12X1/4 (SUCTIONS) IMPLANT
WATER STERILE IRR 3000ML UROMA (IV SOLUTION) IMPLANT

## 2016-02-16 NOTE — Anesthesia Preprocedure Evaluation (Addendum)
Anesthesia Evaluation  Patient identified by MRN, date of birth, ID band Patient awake    Reviewed: Allergy & Precautions, NPO status , Patient's Chart, lab work & pertinent test results  Airway Mallampati: I  TM Distance: >3 FB Neck ROM: Full    Dental  (+) Caps, Partial Upper   Pulmonary neg pulmonary ROS, former smoker,    Pulmonary exam normal breath sounds clear to auscultation       Cardiovascular hypertension, Pt. on medications  Rhythm:Regular Rate:Normal + Systolic murmurs III/VI SEM RUSB   Neuro/Psych negative psych ROS   GI/Hepatic Neg liver ROS,   Endo/Other  negative endocrine ROS  Renal/GU negative Renal ROS   Bladder tumor Gross Hematuria    Musculoskeletal negative musculoskeletal ROS (+)   Abdominal Normal abdominal exam  (+)   Peds  Hematology  (+) anemia ,   Anesthesia Other Findings   Reproductive/Obstetrics Cervical Ca-undergoing ChemoRx                            Anesthesia Physical Anesthesia Plan  ASA: III  Anesthesia Plan: General   Post-op Pain Management:    Induction: Intravenous  Airway Management Planned: LMA  Additional Equipment:   Intra-op Plan:   Post-operative Plan: Extubation in OR  Informed Consent: I have reviewed the patients History and Physical, chart, labs and discussed the procedure including the risks, benefits and alternatives for the proposed anesthesia with the patient or authorized representative who has indicated his/her understanding and acceptance.   Dental advisory given  Plan Discussed with: Anesthesiologist, CRNA and Surgeon  Anesthesia Plan Comments:         Anesthesia Quick Evaluation

## 2016-02-16 NOTE — Transfer of Care (Signed)
  Last Vitals:  Vitals:   02/16/16 1120 02/16/16 1255  BP: (!) 151/60 (!) 149/67  Pulse: 84 72  Resp: 16 (!) 6  Temp: 37 C 36.8 C    Last Pain:  Vitals:   02/16/16 1255  TempSrc:   PainSc: 0-No pain      Patients Stated Pain Goal: 5 (02/16/16 1111)  Immediate Anesthesia Transfer of Care Note  Patient: Sherri Mendez  Procedure(s) Performed: Procedure(s) (LRB): CYSTOSCOPY WITH  BLADDER BIOPSY (N/A)  Patient Location: PACU  Anesthesia Type: General  Level of Consciousness: awake, alert  and oriented  Airway & Oxygen Therapy: Patient Spontanous Breathing and Patient connected to nasal cannula oxygen  Post-op Assessment: Report given to PACU RN and Post -op Vital signs reviewed and stable  Post vital signs: Reviewed and stable  Complications: No apparent anesthesia complications

## 2016-02-16 NOTE — Discharge Instructions (Signed)
°  Post Anesthesia Home Care Instructions  Activity: Get plenty of rest for the remainder of the day. A responsible adult should stay with you for 24 hours following the procedure.  For the next 24 hours, DO NOT: -Drive a car -Paediatric nurse -Drink alcoholic beverages -Take any medication unless instructed by your physician -Make any legal decisions or sign important papers.  Meals: Start with liquid foods such as gelatin or soup. Progress to regular foods as tolerated. Avoid greasy, spicy, heavy foods. If nausea and/or vomiting occur, drink only clear liquids until the nausea and/or vomiting subsides. Call your physician if vomiting continues.  Special Instructions/Symptoms: Your throat may feel dry or sore from the anesthesia or the breathing tube placed in your throat during surgery. If this causes discomfort, gargle with warm salt water. The discomfort should disappear within 24 hours.  If you had a scopolamine patch placed behind your ear for the management of post- operative nausea and/or vomiting:  1. The medication in the patch is effective for 72 hours, after which it should be removed.  Wrap patch in a tissue and discard in the trash. Wash hands thoroughly with soap and water. 2. You may remove the patch earlier than 72 hours if you experience unpleasant side effects which may include dry mouth, dizziness or visual disturbances. 3. Avoid touching the patch. Wash your hands with soap and water after contact with the patch.   Cystoscopy patient instructions  Following a cystoscopy, a catheter (a flexible rubber tube) is sometimes left in place to empty the bladder. This may cause some discomfort or a feeling that you need to urinate. Your doctor determines the period of time that the catheter will be left in place. You may have bloody urine for two to three days (Call your doctor if the amount of bleeding increases or does not subside).  You may pass blood clots in your urine,  especially if you had a biopsy. It is not unusual to pass small blood clots and have some bloody urine a couple of weeks after your cystoscopy. Again, call your doctor if the bleeding does not subside. You may have: Dysuria (painful urination) Frequency (urinating often) Urgency (strong desire to urinate)  These symptoms are common especially if medicine is instilled into the bladder or a ureteral stent is placed. Avoiding alcohol and caffeine, such as coffee, tea, and chocolate, may help relieve these symptoms. Drink plenty of water, unless otherwise instructed. Your doctor may also prescribe an antibiotic or other medicine to reduce these symptoms.  Cystoscopy results are available soon after the procedure; biopsy results usually take two to four days. Your doctor will discuss the results of your exam with you. Before you go home, you will be given specific instructions for follow-up care. Special Instructions:  1 If you are going home with a catheter in place do not take a tub bath until removed by your doctor.  2 You may resume your normal activities.  3 Do not drive or operate machinery if you are taking narcotic pain medicine.  4 Be sure to keep all follow-up appointments with your doctor.   5 Call Your Doctor If: The catheter is not draining  You have severe pain  You are unable to urinate  You have a fever over 101  You have severe bleeding

## 2016-02-16 NOTE — Telephone Encounter (Signed)
lvm to inform pt of added lab/ov per [facility] LOS

## 2016-02-16 NOTE — Anesthesia Procedure Notes (Signed)
Procedure Name: LMA Insertion Date/Time: 02/16/2016 12:18 PM Performed by: Josephine Igo Pre-anesthesia Checklist: Patient identified, Emergency Drugs available, Suction available and Patient being monitored Patient Re-evaluated:Patient Re-evaluated prior to inductionOxygen Delivery Method: Circle system utilized Preoxygenation: Pre-oxygenation with 100% oxygen Intubation Type: IV induction Ventilation: Mask ventilation without difficulty LMA: LMA inserted LMA Size: 4.0 Number of attempts: 1 Airway Equipment and Method: Bite block Placement Confirmation: positive ETCO2 Tube secured with: Tape Dental Injury: Teeth and Oropharynx as per pre-operative assessment

## 2016-02-16 NOTE — Op Note (Signed)
PATIENT:  Sherri Mendez  PRE-OPERATIVE DIAGNOSIS: Bladder tumor  POST-OPERATIVE DIAGNOSIS: Same  PROCEDURE: Biopsy and removal of bladder tumor (0.5 cm)  SURGEON:  Claybon Jabs  INDICATION: Sherri Mendez is a 80 year old female who was seen initially for microscopic hematuria. A CT scan was obtained to evaluate her upper tract which revealed no abnormality of the kidneys however a mass involving the cervical region was identified. In addition cystoscopy in the office revealed a small papillary lesion in the bladder that appeared to be a transitional cell carcinoma. 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  ANESTHESIA:  General  EBL:  Minimal  DRAINS: None  LOCAL MEDICATIONS USED:  None  SPECIMEN:  Biopsied lesion to pathology.  Description of procedure: After informed consent the patient was taken to the operating room and placed on the table in a supine position. General anesthesia was then administered. Once fully anesthetized the patient was moved to the dorsal lithotomy position and the genitalia were sterilely prepped and draped in standard fashion. An official timeout was then performed.  The 23 French cystoscope with 30 lens was passed into the bladder and the bladder was fully and systematically inspected. Both ureteral orifices were noted to be of normal configuration and position. On the left wall of the bladder just posterior and lateral to the left ureteral orifice was a papillary tumor on a stalk with a small satellite lesion adjacent to it. No other tumors, stones or inflammatory lesions were identified within the bladder.  I used the cold cup biopsy forceps to grasp and remove the largest of the tumors and then removed the smaller tumor. These were sent to pathology. I then used the Bugbee electrode to fulgurate the location and the surrounding mucosa. At the end of the procedure there was no bleeding. The bladder  wall was intact and the bladder was emptied and the patient was awakened and taken to recovery room in stable and satisfactory condition. She tolerated procedure well no intraoperative complications.  PLAN OF CARE: Discharge to home after PACU  PATIENT DISPOSITION:  PACU - hemodynamically stable.

## 2016-02-16 NOTE — Progress Notes (Signed)
States " I'm seeing wavy lines like I get with my migaine auras. I take ibuprofen for them. " Dr. Deatra Canter aware. New orders given.

## 2016-02-19 ENCOUNTER — Encounter (HOSPITAL_BASED_OUTPATIENT_CLINIC_OR_DEPARTMENT_OTHER): Payer: Self-pay | Admitting: Urology

## 2016-02-19 ENCOUNTER — Other Ambulatory Visit: Payer: Commercial Managed Care - HMO

## 2016-02-19 ENCOUNTER — Ambulatory Visit: Payer: Commercial Managed Care - HMO

## 2016-02-19 ENCOUNTER — Encounter: Payer: Self-pay | Admitting: Gynecologic Oncology

## 2016-02-19 NOTE — Progress Notes (Signed)
Gynecologic Oncology Multi-Disciplinary Disposition Conference Note  Date of the Conference: February 19, 2016  Patient Name: Sherri Mendez  Referring Provider: Dr. Karsten Ro Primary GYN Oncologist: Dr. Gillian Scarce  Stage/Disposition:  At least Stage IIB cervical cancer pending bladder biopsy from 02/16/16.  Disposition is to chemoradiation with possible chemotherapy to follow.   This Multidisciplinary conference took place involving physicians from [place], [place], Radiation Oncology, Pathology, Radiology along with the Gynecologic Oncology Nurse Practitioner and RN.  Comprehensive assessment of the patient's malignancy, staging, need for surgery, chemotherapy, radiation therapy, and need for further testing were reviewed. Supportive measures, both inpatient and following discharge were also discussed. The recommended plan of care is documented. Greater than 35 minutes were spent correlating and coordinating this patient's care.

## 2016-02-20 NOTE — Telephone Encounter (Signed)
02-16-16 notes from Dr. Karsten Ro with [facility] is in the patient's EMR.  Dr. Marko Plume notified. Antiemetic meds sent to patient's pharmacy on 02-14-16 as noted below by Dr. Marko Plume.

## 2016-02-20 NOTE — Anesthesia Postprocedure Evaluation (Signed)
Anesthesia Post Note  Patient: Sherri Mendez  Procedure(s) Performed: Procedure(s) (LRB): CYSTOSCOPY WITH  BLADDER BIOPSY (N/A)  Patient location during evaluation: PACU Anesthesia Type: General Level of consciousness: awake and alert and oriented Pain management: pain level controlled Vital Signs Assessment: post-procedure vital signs reviewed and stable Respiratory status: spontaneous breathing, nonlabored ventilation and respiratory function stable Cardiovascular status: blood pressure returned to baseline and stable Postop Assessment: no signs of nausea or vomiting Anesthetic complications: no    Last Vitals:  Vitals:   02/16/16 1400 02/16/16 1454  BP: (!) 151/66 (!) 157/70  Pulse: 80 86  Resp: (!) 22 16  Temp:  36.7 C    Last Pain:  Vitals:   02/16/16 1454  TempSrc:   PainSc: 0-No pain                 Agostino Gorin A.

## 2016-02-21 ENCOUNTER — Other Ambulatory Visit: Payer: Self-pay | Admitting: Oncology

## 2016-02-22 ENCOUNTER — Ambulatory Visit: Payer: Commercial Managed Care - HMO | Admitting: Radiation Oncology

## 2016-02-22 ENCOUNTER — Ambulatory Visit (HOSPITAL_BASED_OUTPATIENT_CLINIC_OR_DEPARTMENT_OTHER): Payer: Commercial Managed Care - HMO | Admitting: Oncology

## 2016-02-22 ENCOUNTER — Other Ambulatory Visit (HOSPITAL_BASED_OUTPATIENT_CLINIC_OR_DEPARTMENT_OTHER): Payer: Commercial Managed Care - HMO

## 2016-02-22 ENCOUNTER — Encounter: Payer: Self-pay | Admitting: Oncology

## 2016-02-22 VITALS — BP 144/63 | HR 80 | Temp 98.7°F | Resp 18 | Ht 64.5 in | Wt 153.5 lb

## 2016-02-22 DIAGNOSIS — C539 Malignant neoplasm of cervix uteri, unspecified: Secondary | ICD-10-CM

## 2016-02-22 DIAGNOSIS — M8580 Other specified disorders of bone density and structure, unspecified site: Secondary | ICD-10-CM

## 2016-02-22 DIAGNOSIS — I10 Essential (primary) hypertension: Secondary | ICD-10-CM | POA: Diagnosis not present

## 2016-02-22 DIAGNOSIS — C531 Malignant neoplasm of exocervix: Secondary | ICD-10-CM | POA: Diagnosis not present

## 2016-02-22 DIAGNOSIS — C7989 Secondary malignant neoplasm of other specified sites: Secondary | ICD-10-CM | POA: Diagnosis not present

## 2016-02-22 DIAGNOSIS — N898 Other specified noninflammatory disorders of vagina: Secondary | ICD-10-CM

## 2016-02-22 DIAGNOSIS — C679 Malignant neoplasm of bladder, unspecified: Secondary | ICD-10-CM | POA: Diagnosis not present

## 2016-02-22 DIAGNOSIS — Z888 Allergy status to other drugs, medicaments and biological substances status: Secondary | ICD-10-CM | POA: Diagnosis not present

## 2016-02-22 DIAGNOSIS — Z510 Encounter for antineoplastic radiation therapy: Secondary | ICD-10-CM | POA: Diagnosis not present

## 2016-02-22 DIAGNOSIS — D509 Iron deficiency anemia, unspecified: Secondary | ICD-10-CM | POA: Diagnosis not present

## 2016-02-22 DIAGNOSIS — D508 Other iron deficiency anemias: Secondary | ICD-10-CM

## 2016-02-22 DIAGNOSIS — Z808 Family history of malignant neoplasm of other organs or systems: Secondary | ICD-10-CM | POA: Diagnosis not present

## 2016-02-22 DIAGNOSIS — Z9889 Other specified postprocedural states: Secondary | ICD-10-CM | POA: Diagnosis not present

## 2016-02-22 DIAGNOSIS — Z96642 Presence of left artificial hip joint: Secondary | ICD-10-CM | POA: Diagnosis not present

## 2016-02-22 DIAGNOSIS — Z79899 Other long term (current) drug therapy: Secondary | ICD-10-CM | POA: Diagnosis not present

## 2016-02-22 LAB — COMPREHENSIVE METABOLIC PANEL
ALT: 12 U/L (ref 0–55)
ANION GAP: 7 meq/L (ref 3–11)
AST: 13 U/L (ref 5–34)
Albumin: 3.5 g/dL (ref 3.5–5.0)
Alkaline Phosphatase: 67 U/L (ref 40–150)
BUN: 14.5 mg/dL (ref 7.0–26.0)
CHLORIDE: 103 meq/L (ref 98–109)
CO2: 28 meq/L (ref 22–29)
Calcium: 9 mg/dL (ref 8.4–10.4)
Creatinine: 0.9 mg/dL (ref 0.6–1.1)
EGFR: 66 mL/min/{1.73_m2} — AB (ref >90)
Glucose: 95 mg/dl (ref 70–140)
Potassium: 4.1 mEq/L (ref 3.5–5.1)
Sodium: 139 mEq/L (ref 136–145)
Total Bilirubin: 0.32 mg/dL (ref 0.20–1.20)
Total Protein: 6.6 g/dL (ref 6.4–8.3)

## 2016-02-22 LAB — CBC WITH DIFFERENTIAL/PLATELET
BASO%: 0.6 % (ref 0.0–2.0)
Basophils Absolute: 0.1 10*3/uL (ref 0.0–0.1)
EOS ABS: 0.1 10*3/uL (ref 0.0–0.5)
EOS%: 0.9 % (ref 0.0–7.0)
HCT: 27.7 % — ABNORMAL LOW (ref 34.8–46.6)
HGB: 9.4 g/dL — ABNORMAL LOW (ref 11.6–15.9)
LYMPH%: 16.5 % (ref 14.0–49.7)
MCH: 29.7 pg (ref 25.1–34.0)
MCHC: 33.8 g/dL (ref 31.5–36.0)
MCV: 87.8 fL (ref 79.5–101.0)
MONO#: 0.7 10*3/uL (ref 0.1–0.9)
MONO%: 6.9 % (ref 0.0–14.0)
NEUT#: 7.1 10*3/uL — ABNORMAL HIGH (ref 1.5–6.5)
NEUT%: 75.1 % (ref 38.4–76.8)
PLATELETS: 282 10*3/uL (ref 145–400)
RBC: 3.15 10*6/uL — AB (ref 3.70–5.45)
RDW: 13.8 % (ref 11.2–14.5)
WBC: 9.4 10*3/uL (ref 3.9–10.3)
lymph#: 1.6 10*3/uL (ref 0.9–3.3)

## 2016-02-22 LAB — MAGNESIUM: Magnesium: 2.5 mg/dl (ref 1.5–2.5)

## 2016-02-22 MED ORDER — FERROUS FUMARATE 325 (106 FE) MG PO TABS: ORAL_TABLET | ORAL | 4 refills | Status: DC

## 2016-02-22 NOTE — Progress Notes (Signed)
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 Cystoscopy with biopsy of bladder lesion by Dr Karsten Ro 02-16-16 found low grade noninvasive papillary urothelial carcinoma. Plan is to begin radiation with sensitizing CDDP on 02-26-16.  She does not know if she needs any follow up with urology, message left with that office now.  Patient has continued with some light vaginal bleeding. She had cramping just after the cystoscopy, none since. Appetite is good, drinking lots of water, no other bleeding, no fever or symptoms of infection, no SOB or cough, no LE swelling, bowels ok, not more fatigued.  Remainder of 10 point Review of Systems negative.    ONCOLOGIC HISTORY  Patient had gross hematuria May 2017, initially intermittent and then more continuously. She was seen by PCP, reports urine culture negative, then by Dr Karsten Ro. CT AP at [facility] had minimal left pleural thickening and atelectasis, heart ULN without pericardial effusion, liver/pancreas/spleen/adrenals normal, no urinary tract calculi, no renal lesions, large irregular cervical mass, endometrium thickened, ovaries normal, bilateral obturator nodes 10 mm, left external iliac node 11 mm, other small nodes.  Cystoscopy 01-23-16 found < 0.5 cm tumor posterior and lateral to left ureteral orifice, otherwise no abnormalities. Per Dr Karsten Ro, on visualization this appeared to be transitional cell neoplasm; biopsy is planned on 02-16-16.  She was seen by Dr Gillian Scarce on 01-31-16, with exam remarkable for necrotic tumor replacing cervix and extending to upper third of viagina, extending to right parametrium but not to sidewalls, no cervical, supraclavicular or inguinal adenopathy. Biopsy done 01-31-16  ([id number]) poorly differentiated carcinoma favor squamous cell, IIIB due to right parametrial involvement. PET had hypermetabolic uptake at cervix and low uptake bilateral external iliac nodes, no other distant disease. Bladder biopsy 02-16-16 found noninvasive low grade papillary urothelial carcinoma.     Objective:  Vital signs in last 24 hours:  BP (!) 144/63 (BP Location: Right Arm, Patient Position: Sitting)   Pulse 80   Temp 98.7 F (37.1 C) (Oral)   Resp 18   Ht 5' 4.5" (1.638 m)   Wt 153 lb 8 oz (69.6 kg)   SpO2 96%   BMI 25.94 kg/m  Weight stable. Alert, oriented and appropriate. Ambulatory without difficulty.  Normal hair pattern  HEENT:PERRL, sclerae not icteric. Oral mucosa somewhat pale,  moist without lesions, posterior pharynx clear.  Neck supple. No JVD.  Lymphatics:no cervical,supraclavicular or inguinal adenopathy Resp: clear to auscultation bilaterally and normal percussion bilaterally Cardio: regular rate and rhythm. No gallop. GI: soft, nontender, not distended, no mass or organomegaly. Normally active bowel sounds. Musculoskeletal/ Extremities: without pitting edema, cords, tenderness Neuro: no peripheral neuropathy. Otherwise nonfocal . PSYCH appropriate mood and affect Skin without rash, ecchymosis, petechiae   Lab Results:  Results for orders placed or performed in visit on 02/22/16  CBC with Differential  Result Value Ref Range   WBC 9.4 3.9 - 10.3 10e3/uL   NEUT# 7.1 (H) 1.5 - 6.5 10e3/uL   HGB 9.4 (L) 11.6 - 15.9 g/dL   HCT 27.7 (L) 34.8 - 46.6 %   Platelets 282 145 - 400 10e3/uL   MCV 87.8 79.5 - 101.0 fL   MCH 29.7 25.1 - 34.0 pg   MCHC 33.8 31.5 - 36.0 g/dL   RBC 3.15 (L)  3.70 - 5.45 10e6/uL   RDW 13.8 11.2 - 14.5 %   lymph# 1.6 0.9 - 3.3 10e3/uL   MONO# 0.7 0.1 - 0.9 10e3/uL   Eosinophils Absolute 0.1 0.0 - 0.5 10e3/uL   Basophils Absolute 0.1 0.0 - 0.1 10e3/uL   NEUT% 75.1 38.4 - 76.8 %   LYMPH% 16.5 14.0 - 49.7 %   MONO% 6.9 0.0  - 14.0 %   EOS% 0.9 0.0 - 7.0 %   BASO% 0.6 0.0 - 2.0 %  Comprehensive metabolic panel  Result Value Ref Range   Sodium 139 136 - 145 mEq/L   Potassium 4.1 3.5 - 5.1 mEq/L   Chloride 103 98 - 109 mEq/L   CO2 28 22 - 29 mEq/L   Glucose 95 70 - 140 mg/dl   BUN 14.5 7.0 - 26.0 mg/dL   Creatinine 0.9 0.6 - 1.1 mg/dL   Total Bilirubin 0.32 0.20 - 1.20 mg/dL   Alkaline Phosphatase 67 40 - 150 U/L   AST 13 5 - 34 U/L   ALT 12 0 - 55 U/L   Total Protein 6.6 6.4 - 8.3 g/dL   Albumin 3.5 3.5 - 5.0 g/dL   Calcium 9.0 8.4 - 10.4 mg/dL   Anion Gap 7 3 - 11 mEq/L   EGFR 66 (L) >90 ml/min/1.73 m2  Magnesium  Result Value Ref Range   Magnesium 2.5 1.5 - 2.5 mg/dl    Iron studies from 02-02-16 low, with ferritin 18, serum iron 76 and %sat 18.  Studies/Results: Sherri Mendez, Sherri Mendez Collected: 02/16/2016 Client: [facility] Accession: [id number] Received: 02/16/2016 [name] REPORT OF SURGICAL PATHOLOGYINAL DIAGNOSIS Diagnosis Bladder, biopsy, bladder tumor - LOW GRADE PAPILLARY UROTHELIAL CARCINOMA. - NO EVIDENCE OF INVASION. - MUSCULARIS PROPRIA TISSUE PRESENT AND FREE OF TUMOR.   NUCLEAR MEDICINE PET SKULL BASE TO THIGH 02-06-16 COMPARISON:  CT on 01/19/2016  FINDINGS: NECK  No hypermetabolic lymph nodes in the neck.  CHEST  No hypermetabolic mediastinal or hilar nodes. No suspicious pulmonary nodules on the CT scan. Coronary artery calcification noted.  ABDOMEN/PELVIS  No abnormal hypermetabolic activity within the liver, pancreas, adrenal glands, or spleen.  A hypermetabolic mass is seen centered in region of the uterine cervix. This measures 5.4 x 6.4 cm on image 165/4, and has an SUV max of 16.7. This is consistent with primary cervical carcinoma.  Left external iliac lymph nodes are seen, measuring 10 mm on image 166/4 with SUV max of 2.4, and 8 mm on image 168/4 with SUV max of 3.0. A 10 mm right external iliac lymph node on image 167/4 also shows  mild hyper metabolism with SUV max of 3.0. No hypermetabolic abdominal lymph nodes identified. Aortic atherosclerosis noted.  SKELETON  No focal hypermetabolic activity to suggest skeletal metastasis. Focal areas of hypermetabolic activity are seen in several left ribs which correspond with healing nonpathologic rib fractures.  IMPRESSION: Hypermetabolic cervical mass, consistent with known primary cervical carcinoma.  Mild hypermetabolic bilateral external iliac lymphadenopathy, suspicious for metastatic disease. No evidence of metastatic disease within the abdomen, chest, or neck.  Healing benign-appearing left rib fractures.  Aortic atherosclerosis and coronary artery calcification incidentally noted.   Medications: I have reviewed the patient's current medications. She is willing to begin iron, will use ferrous fumarate once daily on empty stomach with OJ.  May need IV iron if no improvement quickly, to get hemoglobin >=10 for radiation. She has picked up antiemetics.   DISCUSSION Reviewed oral hydration for CDDP.and gave her  printed instructions again.  Discussed iron deficiency anemia and need to begin oral iron, administration instructions.  CBC on 8-4 may have been erroneous. May need IV iron or transfusion to get hemoglobin up to optimal for radiation.  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 Begin radiation 02-26-16 with sensitizing weekly CDDP, which likely will be given x 6 cycles thru 04-01-16. Can use CMET Mg from today for first CDDP on 8-28, needs repeat CBC that day. For subsequent treatments will need CBC CMET Mg weekly. I will follow coordinating with chemo and radiation. 2  Low grade noninvasive urothelial carcinoma, excisional bladder biopsy by Dr Karsten Ro 02-16-16. May have been cause of presenting hematuria, vs vaginal bleeding.  3.iron deficiency anemia: some  ongoing vaginal bleeding. Oral iron as above, may need IV iron or transfusion. History of anemia around hip surgery. 4.post left total hip replacement 12-2014 after femoral neck fracture in fall. Note healing nonpathologic left rib fractures noted on PET. 5.osteopenia by DEXA 2016. Discussed weight bearing exercise. See #4. 6.past minimal tobacco, DCd 1998 7.recent right cataract extraction, doing well 8.never mammograms, which we will address when possible 9.up to date on colonoscopy, no polyps, no further exams planned. 10. HTN followed by PCP  All questions answered and they know to call if questions or concerns between appointments.  Chemo orders confirmed. Time spent 25 min including >50% counseling and coordination of care. Route PCP, cc Drs Carlis Abbott and Sondra Come, cc Dr Karsten Ro   Evlyn Clines, MD   02/22/2016, 10:07 PM

## 2016-02-23 ENCOUNTER — Telehealth: Payer: Self-pay

## 2016-02-23 ENCOUNTER — Ambulatory Visit: Payer: Commercial Managed Care - HMO

## 2016-02-23 NOTE — Telephone Encounter (Signed)
[name] said that Dr. Karsten Ro said that since Sherri Mendez's bladder cancer is low grade and superficial,he will f/u with [facility] in 6 months and do a cystoscopy. [name] to let [facility] and family know this information.

## 2016-02-24 ENCOUNTER — Other Ambulatory Visit: Payer: Self-pay | Admitting: Oncology

## 2016-02-26 ENCOUNTER — Other Ambulatory Visit (HOSPITAL_BASED_OUTPATIENT_CLINIC_OR_DEPARTMENT_OTHER): Payer: Commercial Managed Care - HMO

## 2016-02-26 ENCOUNTER — Ambulatory Visit: Payer: Commercial Managed Care - HMO

## 2016-02-26 ENCOUNTER — Ambulatory Visit
Admission: RE | Admit: 2016-02-26 | Discharge: 2016-02-26 | Disposition: A | Discharge status: Home / Self Care | Payer: Commercial Managed Care - HMO | Source: Ambulatory Visit | Attending: Radiation Oncology | Admitting: Radiation Oncology

## 2016-02-26 ENCOUNTER — Ambulatory Visit (HOSPITAL_BASED_OUTPATIENT_CLINIC_OR_DEPARTMENT_OTHER): Payer: Commercial Managed Care - HMO | Admitting: Oncology

## 2016-02-26 ENCOUNTER — Ambulatory Visit (HOSPITAL_BASED_OUTPATIENT_CLINIC_OR_DEPARTMENT_OTHER): Payer: Commercial Managed Care - HMO

## 2016-02-26 ENCOUNTER — Encounter: Payer: Self-pay | Admitting: Oncology

## 2016-02-26 VITALS — BP 157/69 | HR 91 | Temp 98.7°F | Resp 17

## 2016-02-26 DIAGNOSIS — N939 Abnormal uterine and vaginal bleeding, unspecified: Secondary | ICD-10-CM | POA: Diagnosis not present

## 2016-02-26 DIAGNOSIS — C679 Malignant neoplasm of bladder, unspecified: Secondary | ICD-10-CM

## 2016-02-26 DIAGNOSIS — C539 Malignant neoplasm of cervix uteri, unspecified: Secondary | ICD-10-CM

## 2016-02-26 DIAGNOSIS — C531 Malignant neoplasm of exocervix: Secondary | ICD-10-CM

## 2016-02-26 DIAGNOSIS — Z808 Family history of malignant neoplasm of other organs or systems: Secondary | ICD-10-CM | POA: Diagnosis not present

## 2016-02-26 DIAGNOSIS — I10 Essential (primary) hypertension: Secondary | ICD-10-CM | POA: Diagnosis not present

## 2016-02-26 DIAGNOSIS — D509 Iron deficiency anemia, unspecified: Secondary | ICD-10-CM | POA: Diagnosis not present

## 2016-02-26 DIAGNOSIS — D508 Other iron deficiency anemias: Secondary | ICD-10-CM

## 2016-02-26 DIAGNOSIS — Z5111 Encounter for antineoplastic chemotherapy: Secondary | ICD-10-CM | POA: Diagnosis not present

## 2016-02-26 DIAGNOSIS — Z79899 Other long term (current) drug therapy: Secondary | ICD-10-CM | POA: Diagnosis not present

## 2016-02-26 DIAGNOSIS — Z9889 Other specified postprocedural states: Secondary | ICD-10-CM | POA: Diagnosis not present

## 2016-02-26 DIAGNOSIS — D500 Iron deficiency anemia secondary to blood loss (chronic): Secondary | ICD-10-CM

## 2016-02-26 DIAGNOSIS — Z888 Allergy status to other drugs, medicaments and biological substances status: Secondary | ICD-10-CM | POA: Diagnosis not present

## 2016-02-26 DIAGNOSIS — C7989 Secondary malignant neoplasm of other specified sites: Secondary | ICD-10-CM | POA: Diagnosis not present

## 2016-02-26 DIAGNOSIS — M8580 Other specified disorders of bone density and structure, unspecified site: Secondary | ICD-10-CM

## 2016-02-26 DIAGNOSIS — N950 Postmenopausal bleeding: Secondary | ICD-10-CM

## 2016-02-26 DIAGNOSIS — Z510 Encounter for antineoplastic radiation therapy: Secondary | ICD-10-CM | POA: Diagnosis not present

## 2016-02-26 DIAGNOSIS — Z96642 Presence of left artificial hip joint: Secondary | ICD-10-CM | POA: Diagnosis not present

## 2016-02-26 LAB — CBC WITH DIFFERENTIAL/PLATELET
BASO%: 0.1 % (ref 0.0–2.0)
BASOS ABS: 0 10*3/uL (ref 0.0–0.1)
EOS ABS: 0 10*3/uL (ref 0.0–0.5)
EOS%: 0.2 % (ref 0.0–7.0)
HEMATOCRIT: 27 % — AB (ref 34.8–46.6)
HEMOGLOBIN: 8.8 g/dL — AB (ref 11.6–15.9)
LYMPH#: 1.2 10*3/uL (ref 0.9–3.3)
LYMPH%: 5.8 % — ABNORMAL LOW (ref 14.0–49.7)
MCH: 29.4 pg (ref 25.1–34.0)
MCHC: 32.6 g/dL (ref 31.5–36.0)
MCV: 90.3 fL (ref 79.5–101.0)
MONO#: 1.2 10*3/uL — AB (ref 0.1–0.9)
MONO%: 5.7 % (ref 0.0–14.0)
NEUT#: 18.2 10*3/uL — ABNORMAL HIGH (ref 1.5–6.5)
NEUT%: 88.2 % — AB (ref 38.4–76.8)
PLATELETS: 254 10*3/uL (ref 145–400)
RBC: 2.99 10*6/uL — ABNORMAL LOW (ref 3.70–5.45)
RDW: 14.2 % (ref 11.2–14.5)
WBC: 20.7 10*3/uL — ABNORMAL HIGH (ref 3.9–10.3)

## 2016-02-26 MED ORDER — POTASSIUM CHLORIDE 2 MEQ/ML IV SOLN
Freq: Once | INTRAVENOUS | Status: AC
  Administered 2016-02-26: 12:00:00 via INTRAVENOUS
  Filled 2016-02-26: qty 10

## 2016-02-26 MED ORDER — SODIUM CHLORIDE 0.9 % IV SOLN
40.0000 mg/m2 | Freq: Once | INTRAVENOUS | Status: AC
  Administered 2016-02-26: 72 mg via INTRAVENOUS
  Filled 2016-02-26: qty 72

## 2016-02-26 MED ORDER — SODIUM CHLORIDE 0.9 % IV SOLN
Freq: Once | INTRAVENOUS | Status: AC
  Administered 2016-02-26: 14:00:00 via INTRAVENOUS
  Filled 2016-02-26: qty 5

## 2016-02-26 MED ORDER — SODIUM CHLORIDE 0.9 % IV SOLN
Freq: Once | INTRAVENOUS | Status: AC
  Administered 2016-02-26: 11:00:00 via INTRAVENOUS

## 2016-02-26 MED ORDER — SODIUM CHLORIDE 0.9 % IV SOLN
Freq: Once | INTRAVENOUS | Status: AC
  Administered 2016-02-26: 14:00:00 via INTRAVENOUS
  Filled 2016-02-26: qty 4

## 2016-02-26 NOTE — Progress Notes (Signed)
  Radiation Oncology         [phone] ________________________________  Name: Sherri Mendez MRN: [id number]  Date: 02/26/2016  DOB: [birth year]  Simulation Verification Note    ICD-9-CM ICD-10-CM   1. Malignant neoplasm of cervix, unspecified site ([place]) 180.9 C53.9     Status: outpatient  NARRATIVE: The patient was brought to the treatment unit and placed in the planned treatment position. The clinical setup was verified. Then port films were obtained and uploaded to the radiation oncology medical record software.  The treatment beams were carefully compared against the planned radiation fields. The position location and shape of the radiation fields was reviewed. They targeted volume of tissue appears to be appropriately covered by the radiation beams. Organs at risk appear to be excluded as planned.  Based on my personal review, I approved the simulation verification. The patient's treatment will proceed as planned.  -----------------------------------  Blair Promise, PhD, MD

## 2016-02-26 NOTE — Progress Notes (Signed)
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   She has iron deficiency anemia related to gyn bleeding and previous hematuria, for which she began oral iron last week. She is tolerating the oral iron without difficulty, taking it once daily on empty stomach with orange juice. As hemoglobin is lower today at 8.8 (may be down due to aggressive oral hydration prior to CDDP), I have mentioned possibly transfusing 1 unit PRBCs, as Dr Sondra Come prefers hemoglobin closer to 10 for this radiation. I will be in touch with Dr Sondra Come; patient is willing to increase iron to bid now and would be open to transfusion likely 1 unit PRBCs in addition if needed.   Patient tolerate full oral hydration prior to CDDP today without difficulty. She is willing to increase the oral iron to bid, in addition to transfusion if needed. Vaginal bleeding continues but has not been heavier. She has no GI symptoms, no LE swelling, denies increased SOB or any chest pain. Peripheral IV access obtained with no difficulty today. Remainder of 10 point Review of Systems negative  No PAC  ONCOLOGIC HISTORY Patient had gross hematuria May 2017, initially intermittent and then more continuously. She was seen by PCP, reports urine culture negative, then by Dr Karsten Ro. CT AP at [facility] had minimal left pleural thickening and atelectasis, heart ULN without pericardial effusion, liver/pancreas/spleen/adrenals normal, no urinary tract calculi, no renal lesions, large irregular cervical mass, endometrium thickened, ovaries normal, bilateral obturator nodes 10 mm, left external iliac node 11 mm, other small nodes. Cystoscopy 01-23-16  found <0.5 cm tumor posterior and lateral to left ureteral orifice, otherwise no abnormalities. Per Dr Karsten Ro, on visualization this appeared to be transitional cell neoplasm; biopsy is planned on 02-16-16. She was seen by Dr Gillian Scarce on 01-31-16, with exam remarkable for necrotic tumor replacing cervix and extending to upper third of viagina, extending to right parametrium but not to sidewalls, no cervical, supraclavicular or inguinal adenopathy. Biopsy done 01-31-16 ([id number]) poorly differentiated carcinoma favor squamous cell, IIIB due to right parametrial involvement. PET had hypermetabolic uptake at cervix and low uptake bilateral external iliac nodes, no other distant disease. Bladder biopsy 02-16-16 found noninvasive low grade papillary urothelial carcinoma.     Objective:  Vital signs in last 24 hours: per chemo flow sheets  Drinking fluids now Alert, oriented and appropriate. Ambulatory without assistance into office today. Somewhat pale, not icteric. Respirations not labored RA. No alopecia. No LE swelling. Repositions herself easily in recliner in exam room. Peripheral IV site fine infusing prehydration fluids.  Exam otherwise not repeated, rest of visit spent in discussion and coordination of care.   Lab Results:  Results for orders placed or performed in visit on 02/26/16  CBC with Differential  Result Value Ref Range   WBC 20.7 (H) 3.9 - 10.3 10e3/uL   NEUT# 18.2 (H) 1.5 - 6.5 10e3/uL   HGB 8.8 (L) 11.6 - 15.9 g/dL   HCT 27.0 (L) 34.8 - 46.6 %   Platelets 254 145 - 400 10e3/uL   MCV 90.3 79.5 - 101.0 fL   MCH 29.4 25.1 - 34.0 pg   MCHC 32.6 31.5 - 36.0 g/dL   RBC 2.99 (L) 3.70 - 5.45 10e6/uL  RDW 14.2 11.2 - 14.5 %   lymph# 1.2 0.9 - 3.3 10e3/uL   MONO# 1.2 (H) 0.1 - 0.9 10e3/uL   Eosinophils Absolute 0.0 0.0 - 0.5 10e3/uL   Basophils Absolute 0.0 0.0 - 0.1 10e3/uL   NEUT% 88.2 (H) 38.4 - 76.8 %   LYMPH% 5.8 (L) 14.0 - 49.7 %   MONO% 5.7 0.0 - 14.0 %   EOS% 0.2  0.0 - 7.0 %   BASO% 0.1 0.0 - 2.0 %     Studies/Results:  No results found.  Medications: I have reviewed the patient's current medications. Increase ferrous fumarate to bid, on empty stomach with OJ Reviewed use of antiemetics  DISCUSSION Iron deficiency anemia, related to blood loss from cervical ca and bladder ca, may be lower than optimal for cervical radiation. Increase oral iron, consider PRBCs. As she is tolerating oral iron well, and as benefit from IV iron also would have some delay, probably best to transfuse if higher hemoglobin needed promptly. I have LM for Dr Sondra Come now and patient/ daughter know that we will be back in touch with them after we discuss. She is willing to have transfusion if needed, daughter agrees.   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 Begin radiation 02-26-16 with sensitizing weekly CDDP, which likely will be given x 6 cycles thru 04-01-16. CMET Mg from 8-24 used  for first CDDP on 8-28;  for subsequent treatments will need CBC CMET Mg weekly. I will follow coordinating with chemo and radiation. 2  Low grade noninvasive urothelial carcinoma, excisional bladder biopsy by Dr Karsten Ro 02-16-16. May have been cause of presenting hematuria, vs vaginal bleeding.  3.iron deficiency anemia: ongoing vaginal bleeding. Hemoglobin today down to 8.8, likely not optimal for radiation and likely will drop further with radiation to pelvis and chemo.  Oral iron as above, may need IV iron or transfusion. Dr Sondra Come and I will discuss. History of anemia around hip surgery. 4.post left total hip replacement 12-2014 after femoral neck fracture in fall. Note healing nonpathologic left rib fractures noted on PET. 5.osteopenia by DEXA 2016. Discussed weight bearing exercise. See #4. 6.past minimal tobacco, DCd 1998 7.recent right cataract extraction, doing well 8.never mammograms, which we  will address when possible 9.up to date on colonoscopy, no polyps, no further exams planned. 10. HTN followed by PCP   All questions answered and they know to call if any concerns prior to next appointment. Chemo orders confirmed.  Route PCP, cc Drs Sondra Come and Carlis Abbott to update Time spent 25 min includng >50% counseling and coordination of care, including setting up transfusion subsequent to visit.    Evlyn Clines, MD   02/26/2016, 11:47 AM

## 2016-02-26 NOTE — Patient Instructions (Signed)
[facility] Discharge Instructions for Patients Receiving Chemotherapy  Today you received the following chemotherapy agents: Cisplatin.  To help prevent nausea and vomiting after your treatment, we encourage you to take your nausea medication: Compazine 10 mg every 6 hours as needed; Zofran 8 mg every 8  Hours as needed.   If you develop nausea and vomiting that is not controlled by your nausea medication, call the clinic.   BELOW ARE SYMPTOMS THAT SHOULD BE REPORTED IMMEDIATELY:  *FEVER GREATER THAN 100.5 F  *CHILLS WITH OR WITHOUT FEVER  NAUSEA AND VOMITING THAT IS NOT CONTROLLED WITH YOUR NAUSEA MEDICATION  *UNUSUAL SHORTNESS OF BREATH  *UNUSUAL BRUISING OR BLEEDING  TENDERNESS IN MOUTH AND THROAT WITH OR WITHOUT PRESENCE OF ULCERS  *URINARY PROBLEMS  *BOWEL PROBLEMS  UNUSUAL RASH Items with * indicate a potential emergency and should be followed up as soon as possible.  Feel free to call the clinic you have any questions or concerns. The clinic phone number is [phone].  Please show the [place] at check-in to the Emergency Department and triage nurse.

## 2016-02-27 ENCOUNTER — Ambulatory Visit
Admission: RE | Admit: 2016-02-27 | Discharge: 2016-02-27 | Disposition: A | Discharge status: Home / Self Care | Payer: Commercial Managed Care - HMO | Source: Ambulatory Visit | Attending: Radiation Oncology | Admitting: Radiation Oncology

## 2016-02-27 ENCOUNTER — Ambulatory Visit (HOSPITAL_COMMUNITY)
Admission: RE | Admit: 2016-02-27 | Discharge: 2016-02-27 | Disposition: A | Discharge status: Home / Self Care | Payer: Commercial Managed Care - HMO | Source: Ambulatory Visit | Attending: Oncology | Admitting: Oncology

## 2016-02-27 ENCOUNTER — Encounter (HOSPITAL_COMMUNITY): Payer: Commercial Managed Care - HMO

## 2016-02-27 ENCOUNTER — Telehealth: Payer: Self-pay

## 2016-02-27 ENCOUNTER — Telehealth: Payer: Self-pay | Admitting: Oncology

## 2016-02-27 ENCOUNTER — Other Ambulatory Visit: Payer: Self-pay | Admitting: Oncology

## 2016-02-27 VITALS — BP 154/70 | HR 100 | Temp 98.1°F | Ht 64.5 in | Wt 160.7 lb

## 2016-02-27 DIAGNOSIS — I10 Essential (primary) hypertension: Secondary | ICD-10-CM | POA: Diagnosis not present

## 2016-02-27 DIAGNOSIS — Z888 Allergy status to other drugs, medicaments and biological substances status: Secondary | ICD-10-CM | POA: Diagnosis not present

## 2016-02-27 DIAGNOSIS — Z96642 Presence of left artificial hip joint: Secondary | ICD-10-CM | POA: Diagnosis not present

## 2016-02-27 DIAGNOSIS — C539 Malignant neoplasm of cervix uteri, unspecified: Secondary | ICD-10-CM

## 2016-02-27 DIAGNOSIS — D500 Iron deficiency anemia secondary to blood loss (chronic): Secondary | ICD-10-CM

## 2016-02-27 DIAGNOSIS — Z808 Family history of malignant neoplasm of other organs or systems: Secondary | ICD-10-CM | POA: Diagnosis not present

## 2016-02-27 DIAGNOSIS — C531 Malignant neoplasm of exocervix: Secondary | ICD-10-CM

## 2016-02-27 DIAGNOSIS — Z79899 Other long term (current) drug therapy: Secondary | ICD-10-CM | POA: Diagnosis not present

## 2016-02-27 DIAGNOSIS — C7989 Secondary malignant neoplasm of other specified sites: Secondary | ICD-10-CM | POA: Diagnosis not present

## 2016-02-27 DIAGNOSIS — Z9889 Other specified postprocedural states: Secondary | ICD-10-CM | POA: Diagnosis not present

## 2016-02-27 DIAGNOSIS — Z510 Encounter for antineoplastic radiation therapy: Secondary | ICD-10-CM | POA: Diagnosis not present

## 2016-02-27 DIAGNOSIS — D508 Other iron deficiency anemias: Secondary | ICD-10-CM

## 2016-02-27 NOTE — Telephone Encounter (Signed)
Sherri Mendez is doing well after her Cisplatin treatment  Yesterday. She is drinking fluids well. She has not been nauseous. She is a little constipated.  Did move bowels today. She has Phillips stool softener on hand. Suggested she take this tablet daily to help with bowels.  If stools become too loose she can hold the softener. Pt verbalized understanding Reviewed getting up slowly as she stated she was a little dizzy after standing.  Hg 8.8.  Dr. Sondra Come requested patient be transfused. Sherri Hammer to have T&C done tomorrow after XRT at 1315. She will receive the blood at the [facility] on Thursday 02-29-16 at 8 am.  Sherri katee [place] understanding.  She knows where [facility] is and told her [facility] is on 3rd floor of the same building.

## 2016-02-27 NOTE — Telephone Encounter (Signed)
Left message for Sherri Mendez to call back and let Dr. Mariana Kaufman nurse know how she is doing after her first Cisplatin  treatment yesterday.

## 2016-02-27 NOTE — Progress Notes (Signed)
Sherri Mendez has completed 2 fractions to her pelvis.  She denies having pain or bladder issues.  She reports having constipation this morning.  She continues to have vaginal spotting.  She had chemotherapy yesterday.  She reports occasionally feeling lightheaded when she stands up quickly.    BP (!) 154/70 (BP Location: Right Arm, Patient Position: Sitting)   Pulse 100   Temp 98.1 F (36.7 C) (Oral)   Ht 5' 4.5" (1.638 m)   Wt 160 lb 11.2 oz (72.9 kg)   SpO2 98%   BMI 27.16 kg/m  Wt Readings from Last 3 Encounters:  02/27/16 160 lb 11.2 oz (72.9 kg)  02/22/16 153 lb 8 oz (69.6 kg)  02/16/16 152 lb 8 oz (69.2 kg)

## 2016-02-27 NOTE — Progress Notes (Signed)
  Radiation Oncology         [phone] ________________________________  Name: Sherri Mendez MRN: [id number]  Date: 02/27/2016  DOB: 05-27-1936  Weekly Radiation Therapy Management    ICD-9-CM ICD-10-CM   1. Malignant neoplasm of cervix, unspecified site ([place]) 180.9 C53.9         FIGO Stage IIB poorly differentiated squamous cell carcinoma of the cervix with radiographically positive pelvic metastasis   Current Dose: 3.6 Gy     Planned Dose:  54+ Gy   Narrative . . . . . . . . The patient presents for routine under treatment assessment.                                   The patient is without complaint. [place] has completed 2 fractions to her pelvis.  She denies having pain or bladder issues.  She reports having constipation this morning.  She continues to have vaginal spotting.  She had chemotherapy yesterday.  She reports occasionally feeling lightheaded when she stands up quickly. She describes this issue as occurring since the last few months. Patient reports using "a couple" of pads a day.  Otherwise denies other side affects. The patient is noted to be anemic.                                   Set-up films were reviewed.                                 The chart was checked. Physical Findings. . .  height is 5' 4.5" (1.638 m) and weight is 160 lb 11.2 oz (72.9 kg). Her oral temperature is 98.1 F (36.7 C). Her blood pressure is 154/70 (abnormal) and her pulse is 100. Her oxygen saturation is 98%. . Weight essentially stable.  No significant changes. Lungs are clear to auscultation bilaterally. Heart has regular rate and rhythm. No palpable cervical, supraclavicular, or axillary adenopathy. Abdomen soft, non-tender, normal bowel sounds.  Impression . . . . . . . The patient is tolerating radiation. Plan . . . . . . . . . . . . Continue treatment as planned. The patient will be scheduled for a blood transfusion  either this afternoon or tomorrow in Dr. Mariana Kaufman office.    ________________________________   Blair Promise, PhD, MD  This document serves as a record of services personally performed by Gery Pray, MD. It was created on his behalf by Truddie Hidden, a trained medical scribe. The creation of this record is based on the scribe's personal observations and the provider's statements to them. This document has been checked and approved by the attending provider.

## 2016-02-27 NOTE — Telephone Encounter (Signed)
Left a message for Dr. Mariana Kaufman nurse advising that Dr. Sondra Come would like Sherri Mendez to have a blood transfusion for HGB of 8.8 and patient feeling dizzy when rising to a standing position.

## 2016-02-27 NOTE — Progress Notes (Signed)
Pt here for patient teaching.  Pt given Radiation and You booklet. Pt reports they have not watched the Radiation Therapy Education video and has been given the link to watch at home.  Reviewed areas of pertinence such as diarrhea, fatigue, hair loss, nausea and vomiting, skin changes and urinary and bladder changes . Pt able to give teach back of to pat skin, use unscented/gentle soap, use baby wipes, have Imodium on hand, drink plenty of water and sitz bath,avoid applying anything to skin within 4 hours of treatment. Pt demonstrated understanding and verbalizes understanding of information given and will contact nursing with any questions or concerns.          

## 2016-02-28 ENCOUNTER — Ambulatory Visit
Admission: RE | Admit: 2016-02-28 | Discharge: 2016-02-28 | Disposition: A | Discharge status: Home / Self Care | Payer: Commercial Managed Care - HMO | Source: Ambulatory Visit | Attending: Radiation Oncology | Admitting: Radiation Oncology

## 2016-02-28 ENCOUNTER — Other Ambulatory Visit: Payer: Commercial Managed Care - HMO

## 2016-02-28 DIAGNOSIS — C531 Malignant neoplasm of exocervix: Secondary | ICD-10-CM | POA: Diagnosis not present

## 2016-02-28 DIAGNOSIS — Z510 Encounter for antineoplastic radiation therapy: Secondary | ICD-10-CM | POA: Diagnosis not present

## 2016-02-28 DIAGNOSIS — Z79899 Other long term (current) drug therapy: Secondary | ICD-10-CM | POA: Diagnosis not present

## 2016-02-28 DIAGNOSIS — D500 Iron deficiency anemia secondary to blood loss (chronic): Secondary | ICD-10-CM | POA: Diagnosis not present

## 2016-02-28 DIAGNOSIS — I10 Essential (primary) hypertension: Secondary | ICD-10-CM | POA: Diagnosis not present

## 2016-02-28 DIAGNOSIS — C7989 Secondary malignant neoplasm of other specified sites: Secondary | ICD-10-CM | POA: Diagnosis not present

## 2016-02-28 DIAGNOSIS — Z96642 Presence of left artificial hip joint: Secondary | ICD-10-CM | POA: Diagnosis not present

## 2016-02-28 DIAGNOSIS — C539 Malignant neoplasm of cervix uteri, unspecified: Secondary | ICD-10-CM | POA: Diagnosis not present

## 2016-02-28 DIAGNOSIS — D508 Other iron deficiency anemias: Secondary | ICD-10-CM | POA: Insufficient documentation

## 2016-02-28 DIAGNOSIS — Z808 Family history of malignant neoplasm of other organs or systems: Secondary | ICD-10-CM | POA: Diagnosis not present

## 2016-02-28 DIAGNOSIS — Z888 Allergy status to other drugs, medicaments and biological substances status: Secondary | ICD-10-CM | POA: Diagnosis not present

## 2016-02-28 DIAGNOSIS — N950 Postmenopausal bleeding: Secondary | ICD-10-CM | POA: Insufficient documentation

## 2016-02-28 DIAGNOSIS — Z9889 Other specified postprocedural states: Secondary | ICD-10-CM | POA: Diagnosis not present

## 2016-02-28 LAB — PREPARE RBC (CROSSMATCH)

## 2016-02-28 NOTE — Progress Notes (Signed)
Done

## 2016-02-29 ENCOUNTER — Ambulatory Visit
Admission: RE | Admit: 2016-02-29 | Discharge: 2016-02-29 | Disposition: A | Discharge status: Home / Self Care | Payer: Commercial Managed Care - HMO | Source: Ambulatory Visit | Attending: Radiation Oncology | Admitting: Radiation Oncology

## 2016-02-29 ENCOUNTER — Ambulatory Visit (HOSPITAL_COMMUNITY)
Admission: RE | Admit: 2016-02-29 | Discharge: 2016-02-29 | Disposition: A | Discharge status: Home / Self Care | Payer: Commercial Managed Care - HMO | Source: Ambulatory Visit | Attending: Oncology | Admitting: Oncology

## 2016-02-29 DIAGNOSIS — I10 Essential (primary) hypertension: Secondary | ICD-10-CM | POA: Diagnosis not present

## 2016-02-29 DIAGNOSIS — Z79899 Other long term (current) drug therapy: Secondary | ICD-10-CM | POA: Diagnosis not present

## 2016-02-29 DIAGNOSIS — Z808 Family history of malignant neoplasm of other organs or systems: Secondary | ICD-10-CM | POA: Diagnosis not present

## 2016-02-29 DIAGNOSIS — C531 Malignant neoplasm of exocervix: Secondary | ICD-10-CM

## 2016-02-29 DIAGNOSIS — D500 Iron deficiency anemia secondary to blood loss (chronic): Secondary | ICD-10-CM | POA: Diagnosis not present

## 2016-02-29 DIAGNOSIS — C7989 Secondary malignant neoplasm of other specified sites: Secondary | ICD-10-CM | POA: Diagnosis not present

## 2016-02-29 DIAGNOSIS — Z510 Encounter for antineoplastic radiation therapy: Secondary | ICD-10-CM | POA: Diagnosis not present

## 2016-02-29 DIAGNOSIS — Z96642 Presence of left artificial hip joint: Secondary | ICD-10-CM | POA: Diagnosis not present

## 2016-02-29 DIAGNOSIS — Z9889 Other specified postprocedural states: Secondary | ICD-10-CM | POA: Diagnosis not present

## 2016-02-29 DIAGNOSIS — C539 Malignant neoplasm of cervix uteri, unspecified: Secondary | ICD-10-CM | POA: Diagnosis not present

## 2016-02-29 DIAGNOSIS — Z888 Allergy status to other drugs, medicaments and biological substances status: Secondary | ICD-10-CM | POA: Diagnosis not present

## 2016-02-29 MED ORDER — SODIUM CHLORIDE 0.9 % IV SOLN
250.0000 mL | Freq: Once | INTRAVENOUS | Status: AC
  Administered 2016-02-29: 250 mL via INTRAVENOUS

## 2016-02-29 MED ORDER — SODIUM CHLORIDE 0.9% FLUSH: 3.0000 mL | INTRAVENOUS | Status: DC | PRN

## 2016-02-29 MED ORDER — ACETAMINOPHEN 325 MG PO TABS
325.0000 mg | ORAL_TABLET | Freq: Once | ORAL | Status: AC
  Administered 2016-02-29: 325 mg via ORAL
  Filled 2016-02-29: qty 1

## 2016-02-29 NOTE — Progress Notes (Signed)
Patient ID: Sherri Mendez, female   DOB: 14-Jan-1936, 80 y.o.   MRN: [id number] Provider: Evlyn Clines MD  Associated Diagnosis: Iron deficiency anemia due to chronic blood loss (D50.0); Malignant neoplasm of exocervix ([facility]) (C53.1)  Procedure: Transfusion of 2 units of PRBC via PIV as ordered.  Patient tolerated transfusion well. No reaction. Went over discharge instructions and copy given to patient. Alert, oriented and ambulatory at time of discharge. Discharged home with daughter.

## 2016-02-29 NOTE — Discharge Instructions (Signed)
Blood Transfusion, Care After °Refer to this sheet in the next few weeks. These instructions provide you with information about caring for yourself after your procedure. Your health care provider may also give you more specific instructions. Your treatment has been planned according to current medical practices, but problems sometimes occur. Call your health care provider if you have any problems or questions after your procedure. °WHAT TO EXPECT AFTER THE PROCEDURE °After your procedure, it is common to have: °· Bruising and soreness at the IV site. °· Chills or fever. °· Headache. °HOME CARE INSTRUCTIONS °· Take medicines only as directed by your health care provider. Ask your health care provider if you can take an over-the-counter pain reliever in case you have a fever or headache a day or two after your transfusion. °· Return to your normal activities as directed by your health care provider. °SEEK MEDICAL CARE IF:  °· You develop redness or irritation at your IV site. °· You have persistent fever, chills, or headache. °· Your urine is darker than normal. °· Your urine turns pink, red, or brown.   °· The white part of your eye turns yellow (jaundice).   °· You feel weak after doing your normal activities.   °SEEK IMMEDIATE MEDICAL CARE IF:  °· You have trouble breathing. °· You have fever and chills along with: °¨ Anxiety. °¨ Chest or back pain. °¨ Flushed skin. °¨ Clammy skin. °¨ A rapid heartbeat. °¨ Nausea. °  °This information is not intended to replace advice given to you by your health care provider. Make sure you discuss any questions you have with your health care provider. °  °Document Released: 07/08/2014 Document Reviewed: 07/08/2014 °Elsevier Interactive Patient Education ©2016 [employer] Inc[employer] ° °

## 2016-03-01 ENCOUNTER — Ambulatory Visit
Admission: RE | Admit: 2016-03-01 | Discharge: 2016-03-01 | Disposition: A | Discharge status: Home / Self Care | Payer: Commercial Managed Care - HMO | Source: Ambulatory Visit | Attending: Radiation Oncology | Admitting: Radiation Oncology

## 2016-03-01 DIAGNOSIS — Z510 Encounter for antineoplastic radiation therapy: Secondary | ICD-10-CM | POA: Diagnosis not present

## 2016-03-01 DIAGNOSIS — C539 Malignant neoplasm of cervix uteri, unspecified: Secondary | ICD-10-CM | POA: Diagnosis not present

## 2016-03-01 DIAGNOSIS — I10 Essential (primary) hypertension: Secondary | ICD-10-CM | POA: Diagnosis not present

## 2016-03-01 DIAGNOSIS — C531 Malignant neoplasm of exocervix: Secondary | ICD-10-CM | POA: Diagnosis not present

## 2016-03-01 DIAGNOSIS — Z9889 Other specified postprocedural states: Secondary | ICD-10-CM | POA: Diagnosis not present

## 2016-03-01 DIAGNOSIS — Z96642 Presence of left artificial hip joint: Secondary | ICD-10-CM | POA: Diagnosis not present

## 2016-03-01 DIAGNOSIS — Z888 Allergy status to other drugs, medicaments and biological substances status: Secondary | ICD-10-CM | POA: Diagnosis not present

## 2016-03-01 DIAGNOSIS — C7989 Secondary malignant neoplasm of other specified sites: Secondary | ICD-10-CM | POA: Diagnosis not present

## 2016-03-01 DIAGNOSIS — Z808 Family history of malignant neoplasm of other organs or systems: Secondary | ICD-10-CM | POA: Diagnosis not present

## 2016-03-01 DIAGNOSIS — Z79899 Other long term (current) drug therapy: Secondary | ICD-10-CM | POA: Diagnosis not present

## 2016-03-01 LAB — TYPE AND SCREEN
ABO/RH(D): A NEG
ANTIBODY SCREEN: NEGATIVE
UNIT DIVISION: 0
Unit division: 0

## 2016-03-04 ENCOUNTER — Other Ambulatory Visit: Payer: Self-pay | Admitting: Oncology

## 2016-03-05 ENCOUNTER — Encounter: Payer: Self-pay | Admitting: Oncology

## 2016-03-05 ENCOUNTER — Ambulatory Visit
Admission: RE | Admit: 2016-03-05 | Discharge: 2016-03-05 | Disposition: A | Discharge status: Home / Self Care | Payer: Commercial Managed Care - HMO | Source: Ambulatory Visit | Attending: Radiation Oncology | Admitting: Radiation Oncology

## 2016-03-05 ENCOUNTER — Ambulatory Visit (HOSPITAL_BASED_OUTPATIENT_CLINIC_OR_DEPARTMENT_OTHER): Payer: Commercial Managed Care - HMO

## 2016-03-05 ENCOUNTER — Ambulatory Visit (HOSPITAL_BASED_OUTPATIENT_CLINIC_OR_DEPARTMENT_OTHER): Payer: Commercial Managed Care - HMO | Admitting: Oncology

## 2016-03-05 ENCOUNTER — Other Ambulatory Visit: Payer: Commercial Managed Care - HMO

## 2016-03-05 ENCOUNTER — Other Ambulatory Visit (HOSPITAL_BASED_OUTPATIENT_CLINIC_OR_DEPARTMENT_OTHER): Payer: Commercial Managed Care - HMO

## 2016-03-05 ENCOUNTER — Encounter: Payer: Self-pay | Admitting: Radiation Oncology

## 2016-03-05 VITALS — BP 162/67 | HR 91 | Temp 98.1°F | Ht 64.5 in | Wt 154.9 lb

## 2016-03-05 VITALS — BP 150/65 | HR 80 | Temp 98.4°F | Resp 18

## 2016-03-05 DIAGNOSIS — C531 Malignant neoplasm of exocervix: Secondary | ICD-10-CM

## 2016-03-05 DIAGNOSIS — C7989 Secondary malignant neoplasm of other specified sites: Secondary | ICD-10-CM | POA: Diagnosis not present

## 2016-03-05 DIAGNOSIS — C679 Malignant neoplasm of bladder, unspecified: Secondary | ICD-10-CM | POA: Diagnosis not present

## 2016-03-05 DIAGNOSIS — Z9889 Other specified postprocedural states: Secondary | ICD-10-CM | POA: Diagnosis not present

## 2016-03-05 DIAGNOSIS — Z888 Allergy status to other drugs, medicaments and biological substances status: Secondary | ICD-10-CM | POA: Diagnosis not present

## 2016-03-05 DIAGNOSIS — D509 Iron deficiency anemia, unspecified: Secondary | ICD-10-CM

## 2016-03-05 DIAGNOSIS — Z510 Encounter for antineoplastic radiation therapy: Secondary | ICD-10-CM | POA: Diagnosis not present

## 2016-03-05 DIAGNOSIS — Z96642 Presence of left artificial hip joint: Secondary | ICD-10-CM | POA: Diagnosis not present

## 2016-03-05 DIAGNOSIS — I10 Essential (primary) hypertension: Secondary | ICD-10-CM | POA: Diagnosis not present

## 2016-03-05 DIAGNOSIS — C539 Malignant neoplasm of cervix uteri, unspecified: Secondary | ICD-10-CM

## 2016-03-05 DIAGNOSIS — Z5111 Encounter for antineoplastic chemotherapy: Secondary | ICD-10-CM

## 2016-03-05 DIAGNOSIS — M8580 Other specified disorders of bone density and structure, unspecified site: Secondary | ICD-10-CM | POA: Diagnosis not present

## 2016-03-05 DIAGNOSIS — Z79899 Other long term (current) drug therapy: Secondary | ICD-10-CM | POA: Diagnosis not present

## 2016-03-05 DIAGNOSIS — Z808 Family history of malignant neoplasm of other organs or systems: Secondary | ICD-10-CM | POA: Diagnosis not present

## 2016-03-05 DIAGNOSIS — D500 Iron deficiency anemia secondary to blood loss (chronic): Secondary | ICD-10-CM

## 2016-03-05 LAB — CBC WITH DIFFERENTIAL/PLATELET
BASO%: 0.3 % (ref 0.0–2.0)
Basophils Absolute: 0 10*3/uL (ref 0.0–0.1)
EOS%: 2 % (ref 0.0–7.0)
Eosinophils Absolute: 0.2 10*3/uL (ref 0.0–0.5)
HEMATOCRIT: 33.9 % — AB (ref 34.8–46.6)
HEMOGLOBIN: 11.4 g/dL — AB (ref 11.6–15.9)
LYMPH#: 0.6 10*3/uL — AB (ref 0.9–3.3)
LYMPH%: 5.8 % — ABNORMAL LOW (ref 14.0–49.7)
MCH: 30.2 pg (ref 25.1–34.0)
MCHC: 33.8 g/dL (ref 31.5–36.0)
MCV: 89.5 fL (ref 79.5–101.0)
MONO#: 0.6 10*3/uL (ref 0.1–0.9)
MONO%: 5.6 % (ref 0.0–14.0)
NEUT%: 86.3 % — ABNORMAL HIGH (ref 38.4–76.8)
NEUTROS ABS: 9.5 10*3/uL — AB (ref 1.5–6.5)
PLATELETS: 334 10*3/uL (ref 145–400)
RBC: 3.78 10*6/uL (ref 3.70–5.45)
RDW: 14.1 % (ref 11.2–14.5)
WBC: 11 10*3/uL — AB (ref 3.9–10.3)

## 2016-03-05 LAB — COMPREHENSIVE METABOLIC PANEL
ALT: 19 U/L (ref 0–55)
ANION GAP: 10 meq/L (ref 3–11)
AST: 12 U/L (ref 5–34)
Albumin: 3.3 g/dL — ABNORMAL LOW (ref 3.5–5.0)
Alkaline Phosphatase: 83 U/L (ref 40–150)
BILIRUBIN TOTAL: 0.32 mg/dL (ref 0.20–1.20)
BUN: 8.5 mg/dL (ref 7.0–26.0)
CALCIUM: 8.9 mg/dL (ref 8.4–10.4)
CHLORIDE: 104 meq/L (ref 98–109)
CO2: 23 mEq/L (ref 22–29)
CREATININE: 0.7 mg/dL (ref 0.6–1.1)
EGFR: 85 mL/min/{1.73_m2} — ABNORMAL LOW (ref >90)
Glucose: 95 mg/dl (ref 70–140)
Potassium: 4.1 mEq/L (ref 3.5–5.1)
Sodium: 136 mEq/L (ref 136–145)
TOTAL PROTEIN: 6.6 g/dL (ref 6.4–8.3)

## 2016-03-05 LAB — MAGNESIUM: MAGNESIUM: 2.5 mg/dL (ref 1.5–2.5)

## 2016-03-05 MED ORDER — SODIUM CHLORIDE 0.9 % IV SOLN
40.0000 mg/m2 | Freq: Once | INTRAVENOUS | Status: AC
  Administered 2016-03-05: 72 mg via INTRAVENOUS
  Filled 2016-03-05: qty 72

## 2016-03-05 MED ORDER — SODIUM CHLORIDE 0.9 % IV SOLN
Freq: Once | INTRAVENOUS | Status: AC
  Administered 2016-03-05: 10:00:00 via INTRAVENOUS

## 2016-03-05 MED ORDER — FOSAPREPITANT DIMEGLUMINE INJECTION 150 MG
Freq: Once | INTRAVENOUS | Status: AC
  Administered 2016-03-05: 13:00:00 via INTRAVENOUS
  Filled 2016-03-05: qty 5

## 2016-03-05 MED ORDER — POTASSIUM CHLORIDE 2 MEQ/ML IV SOLN
Freq: Once | INTRAVENOUS | Status: AC
  Administered 2016-03-05: 11:00:00 via INTRAVENOUS
  Filled 2016-03-05: qty 10

## 2016-03-05 MED ORDER — SODIUM CHLORIDE 0.9 % IV SOLN
Freq: Once | INTRAVENOUS | Status: AC
  Administered 2016-03-05: 13:00:00 via INTRAVENOUS
  Filled 2016-03-05: qty 4

## 2016-03-05 NOTE — Progress Notes (Signed)
Sherri Mendez has ocmpleted 6 fractions to her pelvis.  She reports having some tenderness in her vaginal area and is taking Azo as needed.  She has also been given a sitz bath.  She reports having some diarrhea.  She denies having any vaginal/rectal bleeding.  She reports her energy level has improved since getting 2 units of blood last week.  She had chemotherapy today.  She denies having any nausea.   BP (!) 162/67 (BP Location: Right Arm, Patient Position: Sitting)   Pulse 91   Temp 98.1 F (36.7 C) (Oral)   Ht 5' 4.5" (1.638 m)   Wt 154 lb 14.4 oz (70.3 kg)   SpO2 94%   BMI 26.18 kg/m    Wt Readings from Last 3 Encounters:  03/05/16 154 lb 14.4 oz (70.3 kg)  02/27/16 160 lb 11.2 oz (72.9 kg)  02/22/16 153 lb 8 oz (69.6 kg)

## 2016-03-05 NOTE — Progress Notes (Signed)
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 She was transfused 2 units PRBCs on 02-29-16 for hemoglobin 8.8, lower than optimal for radiation benefit.   Patient tolerated first CDDP well, with only minimal nausea improved with prn antiemetics (used 2-3 doses at most). Energy was much better after PRBCs. Vaginal bleeding has stopped entirely and she is tolerating oral iron bid on empty stomach with OJ. Stools have been slightly loose and she has slight perineal sensitivity, will request sitz bath apparatus from rad onc. She denies fever, other bleeding, SOB, abdominal or pelvic pain, swelling LE. Peripheral IV access obtained on first attempt today. No peripheral neuropathy  Remainder of 10 point Review of Systems negative  No PAC  ONCOLOGIC HISTORY Patient had gross hematuria May 2017, initially intermittent and then more continuously. She was seen by PCP, reports urine culture negative, then by Dr Karsten Ro. CT AP at [facility] had minimal left pleural thickening and atelectasis, heart ULN without pericardial effusion, liver/pancreas/spleen/adrenals normal, no urinary tract calculi, no renal lesions, large irregular cervical mass, endometrium thickened, ovaries normal, bilateral obturator nodes 10 mm, left external iliac node 11 mm, other small nodes. Cystoscopy 01-23-16 found <0.5 cm tumor posterior and lateral to left ureteral orifice, otherwise no abnormalities. Per Dr Karsten Ro, on visualization this appeared to be transitional cell neoplasm; biopsy is planned on 02-16-16. She was seen by Dr Gillian Scarce on 01-31-16, with exam remarkable for necrotic tumor replacing cervix and extending to upper third of viagina, extending to  right parametrium but not to sidewalls, no cervical, supraclavicular or inguinal adenopathy. Biopsy done 01-31-16 ([id number]) poorly differentiated carcinoma favor squamous cell,IIIB due to right parametrial involvement. PET had hypermetabolic uptake at cervix and low uptake bilateral external iliac nodes, no other distant disease. Bladder biopsy 02-16-16 found noninvasive low grade papillary urothelial carcinoma. She began weekly sensitizing CDDP on 02-26-16, with radiation. She was transfused 2 units PRBCs on 02-29-16. Vaginal bleeding stopped by second week of treatment.  Objective:  Vital signs in last 24 hours:  Vitals reviewed on chemo flow sheets  Alert, oriented and appropriate. Ambulatory without difficulty into office, comfortable in recliner in infusion now. Color better No alopecia  HEENT:PERRL, sclerae not icteric. Oral mucosa moist without lesions  No JVD.  Lymphatics:no supraclavicular adenopathy Resp: clear to auscultation bilaterally  Cardio: regular rate and rhythm. No gallop. GI: soft, nontender, not distended, no mass or organomegaly. Normally active bowel sounds.  Musculoskeletal/ Extremities: without pitting edema, cords, tenderness Neuro: no peripheral neuropathy. Otherwise nonfocal Skin without rash, ecchymosis, petechiae. Peripheral IV left wrist site fine.   Lab Results:  Results for orders placed or performed in visit on 03/05/16  CBC with Differential  Result Value Ref Range   WBC 11.0 (H) 3.9 - 10.3 10e3/uL   NEUT# 9.5 (H) 1.5 - 6.5 10e3/uL   HGB 11.4 (L) 11.6 - 15.9 g/dL   HCT 33.9 (L) 34.8 - 46.6 %   Platelets 334 145 - 400 10e3/uL   MCV 89.5 79.5 - 101.0 fL   MCH 30.2 25.1 - 34.0 pg   MCHC 33.8 31.5 - 36.0 g/dL   RBC 3.78 3.70 - 5.45 10e6/uL   RDW 14.1 11.2 - 14.5 %   lymph# 0.6 (L) 0.9 - 3.3 10e3/uL  MONO# 0.6 0.1 - 0.9 10e3/uL   Eosinophils Absolute 0.2 0.0 - 0.5 10e3/uL   Basophils Absolute 0.0 0.0 - 0.1 10e3/uL   NEUT% 86.3 (H) 38.4 - 76.8 %    LYMPH% 5.8 (L) 14.0 - 49.7 %   MONO% 5.6 0.0 - 14.0 %   EOS% 2.0 0.0 - 7.0 %   BASO% 0.3 0.0 - 2.0 %  Comprehensive metabolic panel  Result Value Ref Range   Sodium 136 136 - 145 mEq/L   Potassium 4.1 3.5 - 5.1 mEq/L   Chloride 104 98 - 109 mEq/L   CO2 23 22 - 29 mEq/L   Glucose 95 70 - 140 mg/dl   BUN 8.5 7.0 - 26.0 mg/dL   Creatinine 0.7 0.6 - 1.1 mg/dL   Total Bilirubin 0.32 0.20 - 1.20 mg/dL   Alkaline Phosphatase 83 40 - 150 U/L   AST 12 5 - 34 U/L   ALT 19 0 - 55 U/L   Total Protein 6.6 6.4 - 8.3 g/dL   Albumin 3.3 (L) 3.5 - 5.0 g/dL   Calcium 8.9 8.4 - 10.4 mg/dL   Anion Gap 10 3 - 11 mEq/L   EGFR 85 (L) >90 ml/min/1.73 m2  Magnesium  Result Value Ref Range   Magnesium 2.5 1.5 - 2.5 mg/dl     Studies/Results:  No results found.  Medications: I have reviewed the patient's current medications. Continue bid iron. Prn antiemetics. OK for azo.  DISCUSSION Interval history reviewed. Sitz baths and medications as above. She is doing well with hydration. She understands that radiation diarrhea is possibility and that imodium can be helpful She is pleased that she is tolerating treatment well thus far and in agreement with continuing as planned.  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 Radiation begun 02-26-16 with sensitizing weekly CDDP, which likely will be given x 6 cycles thru 04-01-16.  CBC CMET Mg weekly. I will follow coordinating with chemo and radiation. 2 Low grade noninvasive urothelial carcinoma, excisional bladderbiopsy by Dr Karsten Ro 02-16-16. May have beencause of presenting hematuria, vs vaginal bleeding. She is to see Dr Karsten Ro in follow up possibly in 6 months 3.iron deficiency anemia:  vaginal bleeding has stopped. Transfused to improve oxygenation for radiation effect, good improvement.  Oral iron as above, may need IV iron. Follow History of anemia around hip  surgery. 4.post left total hip replacement 12-2014 by Dr Erlinda Hong, after femoral neck fracture in fall. Note healing nonpathologic left rib fractures noted on PET. 5.osteopenia by DEXA 2016. Discussed weight bearing exercise. See #4. 6.past minimal tobacco, DCd 1998 7.recent right cataract extraction, doing well 8.never mammograms, which we will address when possible 9.up to date on colonoscopy, no polyps, no further exams planned. 10. HTN followed by PCP   All questions answered and she knows to call if concerns prior to next scheduled visits Chemo orders confirmed. Time spent 25 min including >50% counseling and coordination of care. Route PCP   Evlyn Clines, MD   03/05/2016, 2:00 PM

## 2016-03-05 NOTE — Progress Notes (Signed)
  Radiation Oncology         [phone] ________________________________  Name: Sherri Mendez MRN: [id number]  Date: 03/05/2016  DOB: January 04, 1936  Weekly Radiation Therapy Management    ICD-9-CM ICD-10-CM   1. 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 . . . . . . . The patient presents for routine under treatment assessment.                       [name] has completed 6 fractions to her pelvis.  She reports having some tenderness in her vaginal area and is taking Azo as needed.  She has also been given a sitz bath.  She reports having some diarrhea.  She denies having any vaginal/rectal bleeding.  She reports her energy level has improved since getting 2 units of blood last week.  She had chemotherapy today.  She denies having any nausea.                                 Set-up films were reviewed.                                 The chart was checked. Physical Findings. . .  height is 5' 4.5" (1.638 m) and weight is 154 lb 14.4 oz (70.3 kg). Her oral temperature is 98.1 F (36.7 C). Her blood pressure is 162/67 (abnormal) and her pulse is 91. Her oxygen saturation is 94%. . Weight essentially stable.  No significant changes. Lungs are clear to auscultation bilaterally. Heart has regular rate and rhythm. Abdomen soft, non-tender, normal bowel sounds. Impression . . . . . . . The patient is tolerating radiation. Plan . . . . . . . . . . . . Continue treatment as planned. The patient was provided with a sitz bath during this encounter. ________________________________   Blair Promise, PhD, MD  This document serves as a record of services personally performed by Gery Pray, MD. It was created on his behalf by Darcus Austin, a trained medical scribe. The creation of this record is based on the  scribe's personal observations and the provider's statements to them. This document has been checked and approved by the attending provider.

## 2016-03-05 NOTE — Patient Instructions (Signed)
[facility] Discharge Instructions for Patients Receiving Chemotherapy  Today you received the following chemotherapy agents: Cisplatin.  To help prevent nausea and vomiting after your treatment, we encourage you to take your nausea medication: Compazine 10 mg every 6 hours as needed; Zofran 8 mg every 8  Hours as needed.   If you develop nausea and vomiting that is not controlled by your nausea medication, call the clinic.   BELOW ARE SYMPTOMS THAT SHOULD BE REPORTED IMMEDIATELY:  *FEVER GREATER THAN 100.5 F  *CHILLS WITH OR WITHOUT FEVER  NAUSEA AND VOMITING THAT IS NOT CONTROLLED WITH YOUR NAUSEA MEDICATION  *UNUSUAL SHORTNESS OF BREATH  *UNUSUAL BRUISING OR BLEEDING  TENDERNESS IN MOUTH AND THROAT WITH OR WITHOUT PRESENCE OF ULCERS  *URINARY PROBLEMS  *BOWEL PROBLEMS  UNUSUAL RASH Items with * indicate a potential emergency and should be followed up as soon as possible.  Feel free to call the clinic you have any questions or concerns. The clinic phone number is [phone].  Please show the [place] at check-in to the Emergency Department and triage nurse.

## 2016-03-06 ENCOUNTER — Ambulatory Visit
Admission: RE | Admit: 2016-03-06 | Discharge: 2016-03-06 | Disposition: A | Discharge status: Home / Self Care | Payer: Commercial Managed Care - HMO | Source: Ambulatory Visit | Attending: Radiation Oncology | Admitting: Radiation Oncology

## 2016-03-06 DIAGNOSIS — Z510 Encounter for antineoplastic radiation therapy: Secondary | ICD-10-CM | POA: Diagnosis not present

## 2016-03-06 DIAGNOSIS — Z9889 Other specified postprocedural states: Secondary | ICD-10-CM | POA: Diagnosis not present

## 2016-03-06 DIAGNOSIS — C531 Malignant neoplasm of exocervix: Secondary | ICD-10-CM | POA: Diagnosis not present

## 2016-03-06 DIAGNOSIS — Z888 Allergy status to other drugs, medicaments and biological substances status: Secondary | ICD-10-CM | POA: Diagnosis not present

## 2016-03-06 DIAGNOSIS — Z808 Family history of malignant neoplasm of other organs or systems: Secondary | ICD-10-CM | POA: Diagnosis not present

## 2016-03-06 DIAGNOSIS — I10 Essential (primary) hypertension: Secondary | ICD-10-CM | POA: Diagnosis not present

## 2016-03-06 DIAGNOSIS — Z96642 Presence of left artificial hip joint: Secondary | ICD-10-CM | POA: Diagnosis not present

## 2016-03-06 DIAGNOSIS — C539 Malignant neoplasm of cervix uteri, unspecified: Secondary | ICD-10-CM | POA: Diagnosis not present

## 2016-03-06 DIAGNOSIS — C7989 Secondary malignant neoplasm of other specified sites: Secondary | ICD-10-CM | POA: Diagnosis not present

## 2016-03-06 DIAGNOSIS — Z79899 Other long term (current) drug therapy: Secondary | ICD-10-CM | POA: Diagnosis not present

## 2016-03-07 ENCOUNTER — Ambulatory Visit
Admission: RE | Admit: 2016-03-07 | Discharge: 2016-03-07 | Disposition: A | Discharge status: Home / Self Care | Payer: Commercial Managed Care - HMO | Source: Ambulatory Visit | Attending: Radiation Oncology | Admitting: Radiation Oncology

## 2016-03-07 DIAGNOSIS — Z96642 Presence of left artificial hip joint: Secondary | ICD-10-CM | POA: Diagnosis not present

## 2016-03-07 DIAGNOSIS — C531 Malignant neoplasm of exocervix: Secondary | ICD-10-CM | POA: Diagnosis not present

## 2016-03-07 DIAGNOSIS — Z79899 Other long term (current) drug therapy: Secondary | ICD-10-CM | POA: Diagnosis not present

## 2016-03-07 DIAGNOSIS — C539 Malignant neoplasm of cervix uteri, unspecified: Secondary | ICD-10-CM | POA: Diagnosis not present

## 2016-03-07 DIAGNOSIS — I10 Essential (primary) hypertension: Secondary | ICD-10-CM | POA: Diagnosis not present

## 2016-03-07 DIAGNOSIS — Z888 Allergy status to other drugs, medicaments and biological substances status: Secondary | ICD-10-CM | POA: Diagnosis not present

## 2016-03-07 DIAGNOSIS — Z510 Encounter for antineoplastic radiation therapy: Secondary | ICD-10-CM | POA: Diagnosis not present

## 2016-03-07 DIAGNOSIS — C7989 Secondary malignant neoplasm of other specified sites: Secondary | ICD-10-CM | POA: Diagnosis not present

## 2016-03-07 DIAGNOSIS — Z808 Family history of malignant neoplasm of other organs or systems: Secondary | ICD-10-CM | POA: Diagnosis not present

## 2016-03-07 DIAGNOSIS — Z9889 Other specified postprocedural states: Secondary | ICD-10-CM | POA: Diagnosis not present

## 2016-03-08 ENCOUNTER — Ambulatory Visit
Admission: RE | Admit: 2016-03-08 | Discharge: 2016-03-08 | Disposition: A | Discharge status: Home / Self Care | Payer: Commercial Managed Care - HMO | Source: Ambulatory Visit | Attending: Radiation Oncology | Admitting: Radiation Oncology

## 2016-03-08 DIAGNOSIS — Z9889 Other specified postprocedural states: Secondary | ICD-10-CM | POA: Diagnosis not present

## 2016-03-08 DIAGNOSIS — I10 Essential (primary) hypertension: Secondary | ICD-10-CM | POA: Diagnosis not present

## 2016-03-08 DIAGNOSIS — Z888 Allergy status to other drugs, medicaments and biological substances status: Secondary | ICD-10-CM | POA: Diagnosis not present

## 2016-03-08 DIAGNOSIS — Z96642 Presence of left artificial hip joint: Secondary | ICD-10-CM | POA: Diagnosis not present

## 2016-03-08 DIAGNOSIS — Z79899 Other long term (current) drug therapy: Secondary | ICD-10-CM | POA: Diagnosis not present

## 2016-03-08 DIAGNOSIS — Z808 Family history of malignant neoplasm of other organs or systems: Secondary | ICD-10-CM | POA: Diagnosis not present

## 2016-03-08 DIAGNOSIS — C7989 Secondary malignant neoplasm of other specified sites: Secondary | ICD-10-CM | POA: Diagnosis not present

## 2016-03-08 DIAGNOSIS — Z510 Encounter for antineoplastic radiation therapy: Secondary | ICD-10-CM | POA: Diagnosis not present

## 2016-03-08 DIAGNOSIS — C531 Malignant neoplasm of exocervix: Secondary | ICD-10-CM | POA: Diagnosis not present

## 2016-03-08 DIAGNOSIS — C539 Malignant neoplasm of cervix uteri, unspecified: Secondary | ICD-10-CM | POA: Diagnosis not present

## 2016-03-10 ENCOUNTER — Ambulatory Visit: Payer: Commercial Managed Care - HMO

## 2016-03-11 ENCOUNTER — Other Ambulatory Visit (HOSPITAL_BASED_OUTPATIENT_CLINIC_OR_DEPARTMENT_OTHER): Payer: Commercial Managed Care - HMO

## 2016-03-11 ENCOUNTER — Other Ambulatory Visit: Payer: Commercial Managed Care - HMO

## 2016-03-11 ENCOUNTER — Ambulatory Visit
Admission: RE | Admit: 2016-03-11 | Discharge: 2016-03-11 | Disposition: A | Discharge status: Home / Self Care | Payer: Commercial Managed Care - HMO | Source: Ambulatory Visit | Attending: Radiation Oncology | Admitting: Radiation Oncology

## 2016-03-11 ENCOUNTER — Ambulatory Visit (HOSPITAL_BASED_OUTPATIENT_CLINIC_OR_DEPARTMENT_OTHER): Payer: Commercial Managed Care - HMO

## 2016-03-11 VITALS — BP 158/64 | HR 73 | Temp 97.3°F | Resp 16

## 2016-03-11 DIAGNOSIS — C539 Malignant neoplasm of cervix uteri, unspecified: Secondary | ICD-10-CM

## 2016-03-11 DIAGNOSIS — Z808 Family history of malignant neoplasm of other organs or systems: Secondary | ICD-10-CM | POA: Diagnosis not present

## 2016-03-11 DIAGNOSIS — Z888 Allergy status to other drugs, medicaments and biological substances status: Secondary | ICD-10-CM | POA: Diagnosis not present

## 2016-03-11 DIAGNOSIS — Z79899 Other long term (current) drug therapy: Secondary | ICD-10-CM | POA: Diagnosis not present

## 2016-03-11 DIAGNOSIS — Z5111 Encounter for antineoplastic chemotherapy: Secondary | ICD-10-CM

## 2016-03-11 DIAGNOSIS — Z510 Encounter for antineoplastic radiation therapy: Secondary | ICD-10-CM | POA: Diagnosis not present

## 2016-03-11 DIAGNOSIS — C531 Malignant neoplasm of exocervix: Secondary | ICD-10-CM

## 2016-03-11 DIAGNOSIS — Z9889 Other specified postprocedural states: Secondary | ICD-10-CM | POA: Diagnosis not present

## 2016-03-11 DIAGNOSIS — C7989 Secondary malignant neoplasm of other specified sites: Secondary | ICD-10-CM | POA: Diagnosis not present

## 2016-03-11 DIAGNOSIS — I10 Essential (primary) hypertension: Secondary | ICD-10-CM | POA: Diagnosis not present

## 2016-03-11 DIAGNOSIS — Z96642 Presence of left artificial hip joint: Secondary | ICD-10-CM | POA: Diagnosis not present

## 2016-03-11 LAB — COMPREHENSIVE METABOLIC PANEL
ALBUMIN: 3.4 g/dL — AB (ref 3.5–5.0)
ALK PHOS: 85 U/L (ref 40–150)
ALT: 15 U/L (ref 0–55)
ANION GAP: 9 meq/L (ref 3–11)
AST: 12 U/L (ref 5–34)
BUN: 12.3 mg/dL (ref 7.0–26.0)
CALCIUM: 9 mg/dL (ref 8.4–10.4)
CHLORIDE: 103 meq/L (ref 98–109)
CO2: 24 mEq/L (ref 22–29)
CREATININE: 0.7 mg/dL (ref 0.6–1.1)
EGFR: 82 mL/min/{1.73_m2} — ABNORMAL LOW (ref >90)
Glucose: 94 mg/dl (ref 70–140)
Potassium: 4.2 mEq/L (ref 3.5–5.1)
Sodium: 137 mEq/L (ref 136–145)
Total Bilirubin: 0.35 mg/dL (ref 0.20–1.20)
Total Protein: 6.8 g/dL (ref 6.4–8.3)

## 2016-03-11 LAB — CBC WITH DIFFERENTIAL/PLATELET
BASO%: 0.3 % (ref 0.0–2.0)
BASOS ABS: 0 10*3/uL (ref 0.0–0.1)
EOS%: 2.1 % (ref 0.0–7.0)
Eosinophils Absolute: 0.1 10*3/uL (ref 0.0–0.5)
HEMATOCRIT: 34.4 % — AB (ref 34.8–46.6)
HEMOGLOBIN: 11.6 g/dL (ref 11.6–15.9)
LYMPH#: 0.6 10*3/uL — AB (ref 0.9–3.3)
LYMPH%: 8.8 % — ABNORMAL LOW (ref 14.0–49.7)
MCH: 30.3 pg (ref 25.1–34.0)
MCHC: 33.7 g/dL (ref 31.5–36.0)
MCV: 89.8 fL (ref 79.5–101.0)
MONO#: 0.4 10*3/uL (ref 0.1–0.9)
MONO%: 6.9 % (ref 0.0–14.0)
NEUT#: 5.2 10*3/uL (ref 1.5–6.5)
NEUT%: 81.9 % — AB (ref 38.4–76.8)
PLATELETS: 204 10*3/uL (ref 145–400)
RBC: 3.83 10*6/uL (ref 3.70–5.45)
RDW: 14.3 % (ref 11.2–14.5)
WBC: 6.3 10*3/uL (ref 3.9–10.3)

## 2016-03-11 LAB — MAGNESIUM: MAGNESIUM: 2.3 mg/dL (ref 1.5–2.5)

## 2016-03-11 MED ORDER — POTASSIUM CHLORIDE 2 MEQ/ML IV SOLN
Freq: Once | INTRAVENOUS | Status: AC
  Administered 2016-03-11: 10:00:00 via INTRAVENOUS
  Filled 2016-03-11: qty 10

## 2016-03-11 MED ORDER — SODIUM CHLORIDE 0.9 % IV SOLN
Freq: Once | INTRAVENOUS | Status: AC
  Administered 2016-03-11: 12:00:00 via INTRAVENOUS
  Filled 2016-03-11: qty 4

## 2016-03-11 MED ORDER — CISPLATIN CHEMO INJECTION 100MG/100ML
40.0000 mg/m2 | Freq: Once | INTRAVENOUS | Status: AC
  Administered 2016-03-11: 72 mg via INTRAVENOUS
  Filled 2016-03-11: qty 72

## 2016-03-11 MED ORDER — SODIUM CHLORIDE 0.9 % IV SOLN
Freq: Once | INTRAVENOUS | Status: AC
  Administered 2016-03-11: 12:00:00 via INTRAVENOUS
  Filled 2016-03-11: qty 5

## 2016-03-11 MED ORDER — SODIUM CHLORIDE 0.9 % IV SOLN
Freq: Once | INTRAVENOUS | Status: AC
  Administered 2016-03-11: 10:00:00 via INTRAVENOUS

## 2016-03-11 NOTE — Patient Instructions (Signed)
Cisplatin injection What is this medicine? CISPLATIN (SIS pla tin) is a chemotherapy drug. It targets fast dividing cells, like cancer cells, and causes these cells to die. This medicine is used to treat many types of cancer like bladder, ovarian, and testicular cancers. This medicine may be used for other purposes; ask your health care provider or pharmacist if you have questions. What should I tell my health care provider before I take this medicine? They need to know if you have any of these conditions: -blood disorders -hearing problems -kidney disease -recent or ongoing radiation therapy -an unusual or allergic reaction to cisplatin, carboplatin, other chemotherapy, other medicines, foods, dyes, or preservatives -pregnant or trying to get pregnant -breast-feeding How should I use this medicine? This drug is given as an infusion into a vein. It is administered in a hospital or clinic by a specially trained health care professional. Talk to your pediatrician regarding the use of this medicine in children. Special care may be needed. Overdosage: If you think you have taken too much of this medicine contact a poison control center or emergency room at once. NOTE: This medicine is only for you. Do not share this medicine with others. What if I miss a dose? It is important not to miss a dose. Call your doctor or health care professional if you are unable to keep an appointment. What may interact with this medicine? -dofetilide -foscarnet -medicines for seizures -medicines to increase blood counts like filgrastim, pegfilgrastim, sargramostim -probenecid -pyridoxine used with altretamine -rituximab -some antibiotics like amikacin, gentamicin, neomycin, polymyxin B, streptomycin, tobramycin -sulfinpyrazone -vaccines -zalcitabine Talk to your doctor or health care professional before taking any of these medicines: -acetaminophen -aspirin -ibuprofen -ketoprofen -naproxen This list may  not describe all possible interactions. Give your health care provider a list of all the medicines, herbs, non-prescription drugs, or dietary supplements you use. Also tell them if you smoke, drink alcohol, or use illegal drugs. Some items may interact with your medicine. What should I watch for while using this medicine? Your condition will be monitored carefully while you are receiving this medicine. You will need important blood work done while you are taking this medicine. This drug may make you feel generally unwell. This is not uncommon, as chemotherapy can affect healthy cells as well as cancer cells. Report any side effects. Continue your course of treatment even though you feel ill unless your doctor tells you to stop. In some cases, you may be given additional medicines to help with side effects. Follow all directions for their use. Call your doctor or health care professional for advice if you get a fever, chills or sore throat, or other symptoms of a cold or flu. Do not treat yourself. This drug decreases your body's ability to fight infections. Try to avoid being around people who are sick. This medicine may increase your risk to bruise or bleed. Call your doctor or health care professional if you notice any unusual bleeding. Be careful brushing and flossing your teeth or using a toothpick because you may get an infection or bleed more easily. If you have any dental work done, tell your dentist you are receiving this medicine. Avoid taking products that contain aspirin, acetaminophen, ibuprofen, naproxen, or ketoprofen unless instructed by your doctor. These medicines may hide a fever. Do not become pregnant while taking this medicine. Women should inform their doctor if they wish to become pregnant or think they might be pregnant. There is a potential for serious side effects to   an unborn child. Talk to your health care professional or pharmacist for more information. Do not breast-feed an  infant while taking this medicine. Drink fluids as directed while you are taking this medicine. This will help protect your kidneys. Call your doctor or health care professional if you get diarrhea. Do not treat yourself. What side effects may I notice from receiving this medicine? Side effects that you should report to your doctor or health care professional as soon as possible: -allergic reactions like skin rash, itching or hives, swelling of the face, lips, or tongue -signs of infection - fever or chills, cough, sore throat, pain or difficulty passing urine -signs of decreased platelets or bleeding - bruising, pinpoint red spots on the skin, black, tarry stools, nosebleeds -signs of decreased red blood cells - unusually weak or tired, fainting spells, lightheadedness -breathing problems -changes in hearing -gout pain -low blood counts - This drug may decrease the number of white blood cells, red blood cells and platelets. You may be at increased risk for infections and bleeding. -nausea and vomiting -pain, swelling, redness or irritation at the injection site -pain, tingling, numbness in the hands or feet -problems with balance, movement -trouble passing urine or change in the amount of urine Side effects that usually do not require medical attention (report to your doctor or health care professional if they continue or are bothersome): -changes in vision -loss of appetite -metallic taste in the mouth or changes in taste This list may not describe all possible side effects. Call your doctor for medical advice about side effects. You may report side effects to FDA at 1-800-FDA-1088. Where should I keep my medicine? This drug is given in a hospital or clinic and will not be stored at home. NOTE: This sheet is a summary. It may not cover all possible information. If you have questions about this medicine, talk to your doctor, pharmacist, or health care provider.    2016, [employer]vier/Gold  Standard. (2007-09-22 14:40:54)  

## 2016-03-12 ENCOUNTER — Ambulatory Visit
Admission: RE | Admit: 2016-03-12 | Discharge: 2016-03-12 | Disposition: A | Discharge status: Home / Self Care | Payer: Commercial Managed Care - HMO | Source: Ambulatory Visit | Attending: Radiation Oncology | Admitting: Radiation Oncology

## 2016-03-12 ENCOUNTER — Encounter: Payer: Self-pay | Admitting: Radiation Oncology

## 2016-03-12 ENCOUNTER — Telehealth: Payer: Self-pay

## 2016-03-12 ENCOUNTER — Ambulatory Visit (HOSPITAL_BASED_OUTPATIENT_CLINIC_OR_DEPARTMENT_OTHER): Payer: Commercial Managed Care - HMO | Admitting: Nurse Practitioner

## 2016-03-12 VITALS — BP 156/60 | HR 81 | Temp 98.0°F | Resp 17

## 2016-03-12 VITALS — BP 163/69 | HR 82 | Temp 98.0°F | Ht 64.5 in | Wt 153.2 lb

## 2016-03-12 DIAGNOSIS — C539 Malignant neoplasm of cervix uteri, unspecified: Secondary | ICD-10-CM | POA: Diagnosis not present

## 2016-03-12 DIAGNOSIS — Z9889 Other specified postprocedural states: Secondary | ICD-10-CM | POA: Diagnosis not present

## 2016-03-12 DIAGNOSIS — T8089XA Other complications following infusion, transfusion and therapeutic injection, initial encounter: Secondary | ICD-10-CM

## 2016-03-12 DIAGNOSIS — Z510 Encounter for antineoplastic radiation therapy: Secondary | ICD-10-CM | POA: Diagnosis not present

## 2016-03-12 DIAGNOSIS — C7989 Secondary malignant neoplasm of other specified sites: Secondary | ICD-10-CM | POA: Diagnosis not present

## 2016-03-12 DIAGNOSIS — Z888 Allergy status to other drugs, medicaments and biological substances status: Secondary | ICD-10-CM | POA: Diagnosis not present

## 2016-03-12 DIAGNOSIS — C531 Malignant neoplasm of exocervix: Secondary | ICD-10-CM | POA: Diagnosis not present

## 2016-03-12 DIAGNOSIS — R609 Edema, unspecified: Secondary | ICD-10-CM | POA: Diagnosis not present

## 2016-03-12 DIAGNOSIS — Z96642 Presence of left artificial hip joint: Secondary | ICD-10-CM | POA: Diagnosis not present

## 2016-03-12 DIAGNOSIS — I10 Essential (primary) hypertension: Secondary | ICD-10-CM | POA: Diagnosis not present

## 2016-03-12 DIAGNOSIS — Z808 Family history of malignant neoplasm of other organs or systems: Secondary | ICD-10-CM | POA: Diagnosis not present

## 2016-03-12 DIAGNOSIS — Z79899 Other long term (current) drug therapy: Secondary | ICD-10-CM | POA: Diagnosis not present

## 2016-03-12 NOTE — Progress Notes (Signed)
Floriene Jeschke has completed 11 fractions to her pelvis.  She denies having pain.  She noticed swelling in her right hand this morning where she had an IV for infusion yesterday.  She said the swelling has gone down.  She will see Selena Lesser, NP this afternoon.  She reports having diarrhea several times last week and took Imodium as needed.  She denies having any bladder issues, vaginal/rectal bleeding and fatigue.  BP (!) 163/69 (BP Location: Left Arm, Patient Position: Sitting)   Pulse 82   Temp 98 F (36.7 C) (Oral)   Ht 5' 4.5" (1.638 m)   Wt 153 lb 3.2 oz (69.5 kg)   SpO2 99%   BMI 25.89 kg/m    Wt Readings from Last 3 Encounters:  03/12/16 153 lb 3.2 oz (69.5 kg)  03/05/16 154 lb 14.4 oz (70.3 kg)  02/27/16 160 lb 11.2 oz (72.9 kg)

## 2016-03-12 NOTE — Telephone Encounter (Signed)
Sherri Mendez called stating that her right hand is swollen from the wrist down. It is tight.  She cannot put rings on fingers. The hand is not red or painful to the touch. Afebrile. She received her chemotherapy yesterday in this hand and wanted to know if the hand needs to be evaluated. She has applied some ice. Swelling down a little bit. Told her to keep hand elevated and apply warm soaks. Pt to have radiation treatment at 1440 and then see Dr. Sondra Come at 3 pm.

## 2016-03-12 NOTE — Telephone Encounter (Signed)
Spoke with Ms Gordin and told her that she will see Selena Lesser, NP in the symptom management Clinic at ~ 1530 today.  She is to register up stairs for appointment when she is finished with radiation appointments. Ms Mcphee verbalized understanding.

## 2016-03-12 NOTE — Progress Notes (Signed)
  Radiation Oncology         [phone]) [phone] ________________________________  Name: Sherri Mendez MRN: [id number]  Date: 03/12/2016  DOB: 1936-01-16  Weekly Radiation Therapy Management    ICD-9-CM ICD-10-CM   1. 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 . . . . . . . The patient presents for routine under treatment assessment.                     Anju Sereno has completed 11 fractions to her pelvis.  She denies having pain.  She noticed swelling in her right hand this morning where she had an IV for infusion yesterday.  She said the swelling has gone down.  She will see Selena Lesser, NP this afternoon.  She reports having diarrhea several times last week and took Imodium as needed.  She denies having any bladder issues, vaginal/rectal bleeding and fatigue. The patient reports that her energy level hasn't changed as of yet, and is able to complete all of her daily duties.                                   Set-up films were reviewed.                                 The chart was checked. Physical Findings. . .  height is 5' 4.5" (1.638 m) and weight is 153 lb 3.2 oz (69.5 kg). Her oral temperature is 98 F (36.7 C). Her blood pressure is 163/69 (abnormal) and her pulse is 82. Her oxygen saturation is 99%. . Weight essentially stable.  No significant changes. Lungs are clear to auscultation bilaterally. Heart has regular rate and rhythm. Abdomen soft, non-tender, normal bowel sounds. Impression . . . . . . . The patient is tolerating radiation. Plan . . . . . . . . . . . . Continue treatment as planned.  ________________________________   Blair Promise, PhD, MD  This document serves as a record of services personally performed by Gery Pray, MD. It was created on his behalf by Truddie Hidden, a trained medical scribe. The creation of this record is based on the scribe's personal observations and the provider's statements to them. This document has been checked and approved by the attending provider.

## 2016-03-13 ENCOUNTER — Encounter: Payer: Self-pay | Admitting: Nurse Practitioner

## 2016-03-13 ENCOUNTER — Ambulatory Visit: Payer: Commercial Managed Care - HMO

## 2016-03-13 ENCOUNTER — Other Ambulatory Visit: Payer: Self-pay | Admitting: Oncology

## 2016-03-13 ENCOUNTER — Ambulatory Visit
Admission: RE | Admit: 2016-03-13 | Discharge: 2016-03-13 | Disposition: A | Discharge status: Home / Self Care | Payer: Commercial Managed Care - HMO | Source: Ambulatory Visit | Attending: Radiation Oncology | Admitting: Radiation Oncology

## 2016-03-13 DIAGNOSIS — C539 Malignant neoplasm of cervix uteri, unspecified: Secondary | ICD-10-CM | POA: Diagnosis not present

## 2016-03-13 DIAGNOSIS — IMO0002 Reserved for concepts with insufficient information to code with codable children: Secondary | ICD-10-CM | POA: Insufficient documentation

## 2016-03-13 DIAGNOSIS — Z79899 Other long term (current) drug therapy: Secondary | ICD-10-CM | POA: Diagnosis not present

## 2016-03-13 DIAGNOSIS — C531 Malignant neoplasm of exocervix: Secondary | ICD-10-CM | POA: Diagnosis not present

## 2016-03-13 DIAGNOSIS — Z808 Family history of malignant neoplasm of other organs or systems: Secondary | ICD-10-CM | POA: Diagnosis not present

## 2016-03-13 DIAGNOSIS — C7989 Secondary malignant neoplasm of other specified sites: Secondary | ICD-10-CM | POA: Diagnosis not present

## 2016-03-13 DIAGNOSIS — Z9889 Other specified postprocedural states: Secondary | ICD-10-CM | POA: Diagnosis not present

## 2016-03-13 DIAGNOSIS — Z96642 Presence of left artificial hip joint: Secondary | ICD-10-CM | POA: Diagnosis not present

## 2016-03-13 DIAGNOSIS — I10 Essential (primary) hypertension: Secondary | ICD-10-CM | POA: Diagnosis not present

## 2016-03-13 DIAGNOSIS — Z888 Allergy status to other drugs, medicaments and biological substances status: Secondary | ICD-10-CM | POA: Diagnosis not present

## 2016-03-13 DIAGNOSIS — Z510 Encounter for antineoplastic radiation therapy: Secondary | ICD-10-CM | POA: Diagnosis not present

## 2016-03-13 NOTE — Assessment & Plan Note (Signed)
Patient received cycle 3 of her cisplatin chemotherapy yesterday.  03/11/2016.  She received the chemotherapy via peripheral IV to her right hand.  Patient states that she noticed late last night that her right hand was edematous.  She denies any pain, erythema, warmth, or red streaks to the site.  Patient states that the swelling has greatly improved within the past 12 hours.  Exam today reveals mild edema to the entire right hand including fingers.  There is no erythema, warmth, tenderness, or red streaks.  Explained to the patient.  There is a possibility that this is some mild phlebitis post peripheral IV insertion; but is most likely a probable mild extravasation instead.  Reviewed possible.  Cisplatin extravasation protocol/plan with pharmacist today; and then instructed patient to keep arm elevated above the level of the heart whenever possible.  Also, can use some cool compresses to the site as well.  Patient was advised to call/return or go directly to the emergency department for any worsening symptoms whatsoever.

## 2016-03-13 NOTE — Progress Notes (Signed)
SYMPTOM MANAGEMENT CLINIC    Chief Complaint: Possible Extravasation  HPI:  Sherri Mendez 80 y.o. female diagnosed with cervical cancer.  Currently undergoing cisplatin chemotherapy and radiation treatments.    No history exists.    Review of Systems  Skin:       Right hand edema  All other systems reviewed and are negative.   Past Medical History:  Diagnosis Date  . Anemia   . Bladder tumor   . Cervical cancer [facility]) dx 01-31-2016--  oncologist-  dr livesay/  dr Sondra Come   FIGO Stage IIB, Invasive poorly differeniated sqaumous cell carcinoma, involiving bilateral external iliac nodes   . Gross hematuria   . History of rib fracture    left side  . Hypertension   . Lower urinary tract symptoms (LUTS)   . Osteopenia   . Wears glasses     Past Surgical History:  Procedure Laterality Date  . CATARACT EXTRACTION W/ INTRAOCULAR LENS IMPLANT Right 10/2015  . COLONOSCOPY  2016  . CYSTOSCOPY WITH BIOPSY N/A 02/16/2016   Procedure: CYSTOSCOPY WITH  BLADDER BIOPSY;  Surgeon: [name], MD;  Location: [facility];  Service: Urology;  Laterality: N/A;  . ORIF RIGHT ANKLE FX  2006   retined hardware  . TOTAL HIP ARTHROPLASTY Left 12/28/2014   Procedure: TOTAL HIP ARTHROPLASTY ANTERIOR APPROACH;  Surgeon: Leandrew Koyanagi, MD;  Location: [place];  Service: Orthopedics;  Laterality: Left;  femur fracture    has Fracture of femoral neck, left ([place]); Leukocytosis; Elevated blood pressure; Malignant neoplasm of exocervix ([place]); Postmenopausal vaginal bleeding; Iron deficiency anemia due to chronic blood loss; and Extravasation, infusion or chemotherapeutic agent on her problem list.    is allergic to sulfa antibiotics.    Medication List       Accurate as of 03/12/16 11:59 PM. Always use your most recent med list.          acetaminophen 325 MG tablet Commonly known as:  TYLENOL Take 650 mg by mouth every 6 (six) hours as needed.   amLODipine-benazepril 5-10 MG  capsule Commonly known as:  LOTREL Take 1 capsule by mouth every evening.   aspirin 81 MG tablet Take 81 mg by mouth daily.   ferrous fumarate 325 (106 Fe) MG Tabs tablet Commonly known as:  HEMOCYTE - 106 mg FE Take 1 tablet daily on an empty stomach with OJ.   GNP CALCIUM 1200 1200-1000 MG-UNIT Chew Chew 1 tablet by mouth daily.   loperamide 2 MG tablet Commonly known as:  IMODIUM A-D Take 2 mg by mouth 4 (four) times daily as needed for diarrhea or loose stools.   multivitamin with minerals tablet Take 1 tablet by mouth daily.   ondansetron 8 MG tablet Commonly known as:  ZOFRAN Take 1 tablet (8 mg total) by mouth every 8 (eight) hours as needed for nausea.   phenazopyridine 95 MG tablet Commonly known as:  PYRIDIUM Take 95 mg by mouth 3 (three) times daily as needed for pain.   prochlorperazine 10 MG tablet Commonly known as:  COMPAZINE Take 1 tablet (10 mg total) by mouth every 6 (six) hours as needed for nausea or vomiting.        PHYSICAL EXAMINATION  Oncology Vitals 03/12/2016 03/12/2016  Height - 164 cm  Weight - 69.491 kg  Weight (lbs) - 153 lbs 3 oz  BMI (kg/m2) - 25.89 kg/m2  Temp 98 98  Pulse 81 82  Resp 17 -  SpO2 98  99  BSA (m2) - 1.78 m2   BP Readings from Last 2 Encounters:  03/12/16 (!) 163/69  03/12/16 (!) 156/60    Physical Exam  Constitutional: She is oriented to person, place, and time and well-developed, well-nourished, and in no distress.  HENT:  Head: Normocephalic and atraumatic.  Eyes: Conjunctivae and EOM are normal. Pupils are equal, round, and reactive to light.  Neck: Normal range of motion.  Pulmonary/Chest: Effort normal. No respiratory distress.  Musculoskeletal: Normal range of motion. She exhibits edema. She exhibits no tenderness.  Neurological: She is alert and oriented to person, place, and time.  Skin: Skin is warm and dry.  Psychiatric: Affect normal.  Nursing note and vitals reviewed.   LABORATORY  DATA:. Appointment on 03/11/2016  Component Date Value Ref Range Status  . WBC 03/11/2016 6.3  3.9 - 10.3 10e3/uL Final  . NEUT# 03/11/2016 5.2  1.5 - 6.5 10e3/uL Final  . HGB 03/11/2016 11.6  11.6 - 15.9 g/dL Final  . HCT 03/11/2016 34.4* 34.8 - 46.6 % Final  . Platelets 03/11/2016 204  145 - 400 10e3/uL Final  . MCV 03/11/2016 89.8  79.5 - 101.0 fL Final  . MCH 03/11/2016 30.3  25.1 - 34.0 pg Final  . MCHC 03/11/2016 33.7  31.5 - 36.0 g/dL Final  . RBC 03/11/2016 3.83  3.70 - 5.45 10e6/uL Final  . RDW 03/11/2016 14.3  11.2 - 14.5 % Final  . lymph# 03/11/2016 0.6* 0.9 - 3.3 10e3/uL Final  . MONO# 03/11/2016 0.4  0.1 - 0.9 10e3/uL Final  . Eosinophils Absolute 03/11/2016 0.1  0.0 - 0.5 10e3/uL Final  . Basophils Absolute 03/11/2016 0.0  0.0 - 0.1 10e3/uL Final  . NEUT% 03/11/2016 81.9* 38.4 - 76.8 % Final  . LYMPH% 03/11/2016 8.8* 14.0 - 49.7 % Final  . MONO% 03/11/2016 6.9  0.0 - 14.0 % Final  . EOS% 03/11/2016 2.1  0.0 - 7.0 % Final  . BASO% 03/11/2016 0.3  0.0 - 2.0 % Final  . Sodium 03/11/2016 137  136 - 145 mEq/L Final  . Potassium 03/11/2016 4.2  3.5 - 5.1 mEq/L Final  . Chloride 03/11/2016 103  98 - 109 mEq/L Final  . CO2 03/11/2016 24  22 - 29 mEq/L Final  . Glucose 03/11/2016 94  70 - 140 mg/dl Final  . BUN 03/11/2016 12.3  7.0 - 26.0 mg/dL Final  . Creatinine 03/11/2016 0.7  0.6 - 1.1 mg/dL Final  . Total Bilirubin 03/11/2016 0.35  0.20 - 1.20 mg/dL Final  . Alkaline Phosphatase 03/11/2016 85  40 - 150 U/L Final  . AST 03/11/2016 12  5 - 34 U/L Final  . ALT 03/11/2016 15  0 - 55 U/L Final  . Total Protein 03/11/2016 6.8  6.4 - 8.3 g/dL Final  . Albumin 03/11/2016 3.4* 3.5 - 5.0 g/dL Final  . Calcium 03/11/2016 9.0  8.4 - 10.4 mg/dL Final  . Anion Gap 03/11/2016 9  3 - 11 mEq/L Final  . EGFR 03/11/2016 82* >90 ml/min/1.73 m2 Final  . Magnesium 03/11/2016 2.3  1.5 - 2.5 mg/dl Final    RADIOGRAPHIC STUDIES: No results found.  ASSESSMENT/PLAN:    Malignant neoplasm  of exocervix [facility]) Patient received cycle 3 of her cisplatin chemotherapy yesterday, 03/11/2016.  She also continues with daily radiation treatments.  She is scheduled to return tomorrow 03/13/2016 for recheck of her right hand.  She is also scheduled for follow-up visit on 03/14/2016.  She is scheduled for labs and  her next cycle of chemotherapy.  03/18/2016.  Extravasation, infusion or chemotherapeutic agent Patient received cycle 3 of her cisplatin chemotherapy yesterday.  03/11/2016.  She received the chemotherapy via peripheral IV to her right hand.  Patient states that she noticed late last night that her right hand was edematous.  She denies any pain, erythema, warmth, or red streaks to the site.  Patient states that the swelling has greatly improved within the past 12 hours.  Exam today reveals mild edema to the entire right hand including fingers.  There is no erythema, warmth, tenderness, or red streaks.  Explained to the patient.  There is a possibility that this is some mild phlebitis post peripheral IV insertion; but is most likely a probable mild extravasation instead.  Reviewed possible.  Cisplatin extravasation protocol/plan with pharmacist today; and then instructed patient to keep arm elevated above the level of the heart whenever possible.  Also, can use some cool compresses to the site as well.  Patient was advised to call/return or go directly to the emergency department for any worsening symptoms whatsoever.   Patient stated understanding of all instructions; and was in agreement with this plan of care. The patient knows to call the clinic with any problems, questions or concerns.   Total time spent with patient was 25 minutes;  with greater than 75 percent of that time spent in face to face counseling regarding patient's symptoms,  and coordination of care and follow up.  Disclaimer:This dictation was prepared with Dragon/digital dictation along with [employer].  Any transcriptional errors that result from this process are unintentional.  Drue Second, NP 03/13/2016

## 2016-03-13 NOTE — Assessment & Plan Note (Signed)
Patient received cycle 3 of her cisplatin chemotherapy yesterday, 03/11/2016.  She also continues with daily radiation treatments.  She is scheduled to return tomorrow 03/13/2016 for recheck of her right hand.  She is also scheduled for follow-up visit on 03/14/2016.  She is scheduled for labs and her next cycle of chemotherapy.  03/18/2016.

## 2016-03-14 ENCOUNTER — Encounter: Payer: Self-pay | Admitting: Oncology

## 2016-03-14 ENCOUNTER — Ambulatory Visit
Admission: RE | Admit: 2016-03-14 | Discharge: 2016-03-14 | Disposition: A | Discharge status: Home / Self Care | Payer: Commercial Managed Care - HMO | Source: Ambulatory Visit | Attending: Radiation Oncology | Admitting: Radiation Oncology

## 2016-03-14 ENCOUNTER — Ambulatory Visit (HOSPITAL_BASED_OUTPATIENT_CLINIC_OR_DEPARTMENT_OTHER): Payer: Commercial Managed Care - HMO | Admitting: Oncology

## 2016-03-14 VITALS — BP 151/62 | HR 80 | Temp 98.2°F | Resp 18 | Ht 64.5 in | Wt 150.8 lb

## 2016-03-14 DIAGNOSIS — Z510 Encounter for antineoplastic radiation therapy: Secondary | ICD-10-CM | POA: Diagnosis not present

## 2016-03-14 DIAGNOSIS — I10 Essential (primary) hypertension: Secondary | ICD-10-CM

## 2016-03-14 DIAGNOSIS — C679 Malignant neoplasm of bladder, unspecified: Secondary | ICD-10-CM

## 2016-03-14 DIAGNOSIS — M8580 Other specified disorders of bone density and structure, unspecified site: Secondary | ICD-10-CM | POA: Diagnosis not present

## 2016-03-14 DIAGNOSIS — Z79899 Other long term (current) drug therapy: Secondary | ICD-10-CM | POA: Diagnosis not present

## 2016-03-14 DIAGNOSIS — Z5111 Encounter for antineoplastic chemotherapy: Secondary | ICD-10-CM

## 2016-03-14 DIAGNOSIS — Z96642 Presence of left artificial hip joint: Secondary | ICD-10-CM | POA: Diagnosis not present

## 2016-03-14 DIAGNOSIS — D509 Iron deficiency anemia, unspecified: Secondary | ICD-10-CM

## 2016-03-14 DIAGNOSIS — C7989 Secondary malignant neoplasm of other specified sites: Secondary | ICD-10-CM | POA: Diagnosis not present

## 2016-03-14 DIAGNOSIS — Z888 Allergy status to other drugs, medicaments and biological substances status: Secondary | ICD-10-CM | POA: Diagnosis not present

## 2016-03-14 DIAGNOSIS — D508 Other iron deficiency anemias: Secondary | ICD-10-CM

## 2016-03-14 DIAGNOSIS — Z9889 Other specified postprocedural states: Secondary | ICD-10-CM | POA: Diagnosis not present

## 2016-03-14 DIAGNOSIS — C672 Malignant neoplasm of lateral wall of bladder: Secondary | ICD-10-CM

## 2016-03-14 DIAGNOSIS — C531 Malignant neoplasm of exocervix: Secondary | ICD-10-CM

## 2016-03-14 DIAGNOSIS — C539 Malignant neoplasm of cervix uteri, unspecified: Secondary | ICD-10-CM

## 2016-03-14 DIAGNOSIS — Z808 Family history of malignant neoplasm of other organs or systems: Secondary | ICD-10-CM | POA: Diagnosis not present

## 2016-03-14 NOTE — Progress Notes (Signed)
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 IMRT is scheduled thru Monday 04-08-16; weekly CDDP will be thru cycle 6 on 04-01-16.  She was transfused 2 units PRBCs on 02-29-16.  Mild CDDP extravasation 03-11-16, resolved   Patient is tolerating treatment well overall. She has had no vaginal bleeding since shortly after start of radiation. Appetite is fair, but likes chocolate Boost and is glad to drink ~ 3 of these daily. Trying to stay well hydrated. Slight occasional nausea, no vomiting. Perineal area not raw, does have sitz bath but has not needed to use this. Not excessively fatigued. No abdominal or pelvic pain. Bladder ok. Bowels moving small amounts of loose stool >=6x daily, including 3 times by afternoon appointment today; she has used imodium only up to 4 tablets in 24 hours, instructed that she can use up to 8 in 24 hrs and should let rad onc or med onc know if that is not controlling diarrhea (lomotil if not). No LE swelling. No SOB. Peripheral IV access ok for chemo. No peripheral neuropathy. No discomfort or residual problems at side of chemo extravasation right hand 03-11-16. Remainder of 10 point Review of Systems negative.   ONCOLOGIC HISTORY Patient had gross hematuria May 2017, initially intermittent and then more continuously. She was seen by PCP, reports urine culture negative, then by Dr Karsten Ro. CT AP at [facility] had minimal left pleural thickening and atelectasis, heart ULN without pericardial effusion, liver/pancreas/spleen/adrenals normal, no urinary tract calculi, no renal lesions, large irregular cervical mass, endometrium  thickened, ovaries normal, bilateral obturator nodes 10 mm, left external iliac node 11 mm, other small nodes. Cystoscopy 01-23-16 found <0.5 cm tumor posterior and lateral to left ureteral orifice, otherwise no abnormalities. Per Dr Karsten Ro, on visualization this appeared to be transitional cell neoplasm; biopsy is planned on 02-16-16. She was seen by Dr Gillian Scarce on 01-31-16, with exam remarkable for necrotic tumor replacing cervix and extending to upper third of viagina, extending to right parametrium but not to sidewalls, no cervical, supraclavicular or inguinal adenopathy. Biopsy done 01-31-16 ([id number]) poorly differentiated carcinoma favor squamous cell,IIIB due to right parametrial involvement. PET had hypermetabolic uptake at cervix and low uptake bilateral external iliac nodes, no other distant disease. Bladder biopsy 02-16-16 found noninvasive low grade papillary urothelial carcinoma. She began weekly sensitizing CDDP on 02-26-16, with radiation. She was transfused 2 units PRBCs on 02-29-16. Vaginal bleeding stopped by second week of treatment.  Objective:  Vital signs in last 24 hours:  BP (!) 151/62 (BP Location: Right Arm, Patient Position: Sitting) Comment: informed nurse  Pulse 80   Temp 98.2 F (36.8 C) (Oral)   Resp 18   Ht 5' 4.5" (1.638 m)   Wt 150 lb 12.8 oz (68.4 kg)   BMI 25.49 kg/m  Weight down 3 lbs from 03-05-16. Looks comfortable seated in exam room.  Alert, oriented and appropriate. Ambulatory without difficulty.  No alopecia  HEENT:PERRL, sclerae not icteric. Oral mucosa moist without lesions, posterior pharynx clear.  Neck supple. No JVD.  Lymphatics:no cervical,supraclavicular adenopathy Resp: clear to auscultation bilaterally  Cardio: regular rate and rhythm. No gallop. GI: soft, nontender, not distended, no mass or organomegaly. Normally active bowel sounds.  Musculoskeletal/ Extremities:  No swelling, erythema, tenderness or other findings right hand at area  of chemo extravasation from 03-11-16. LE without pitting edema, cords, tenderness Neuro: no peripheral neuropathy. Otherwise nonfocal. PSYCH appropriate mood and affect Skin without rash, ecchymosis, petechiae   Lab Results: Labs reviewed from 9-11: Results for orders placed or performed in visit on 03/11/16  CBC with Differential  Result Value Ref Range   WBC 6.3 3.9 - 10.3 10e3/uL   NEUT# 5.2 1.5 - 6.5 10e3/uL   HGB 11.6 11.6 - 15.9 g/dL   HCT 34.4 (L) 34.8 - 46.6 %   Platelets 204 145 - 400 10e3/uL   MCV 89.8 79.5 - 101.0 fL   MCH 30.3 25.1 - 34.0 pg   MCHC 33.7 31.5 - 36.0 g/dL   RBC 3.83 3.70 - 5.45 10e6/uL   RDW 14.3 11.2 - 14.5 %   lymph# 0.6 (L) 0.9 - 3.3 10e3/uL   MONO# 0.4 0.1 - 0.9 10e3/uL   Eosinophils Absolute 0.1 0.0 - 0.5 10e3/uL   Basophils Absolute 0.0 0.0 - 0.1 10e3/uL   NEUT% 81.9 (H) 38.4 - 76.8 %   LYMPH% 8.8 (L) 14.0 - 49.7 %   MONO% 6.9 0.0 - 14.0 %   EOS% 2.1 0.0 - 7.0 %   BASO% 0.3 0.0 - 2.0 %  Comprehensive metabolic panel  Result Value Ref Range   Sodium 137 136 - 145 mEq/L   Potassium 4.2 3.5 - 5.1 mEq/L   Chloride 103 98 - 109 mEq/L   CO2 24 22 - 29 mEq/L   Glucose 94 70 - 140 mg/dl   BUN 12.3 7.0 - 26.0 mg/dL   Creatinine 0.7 0.6 - 1.1 mg/dL   Total Bilirubin 0.35 0.20 - 1.20 mg/dL   Alkaline Phosphatase 85 40 - 150 U/L   AST 12 5 - 34 U/L   ALT 15 0 - 55 U/L   Total Protein 6.8 6.4 - 8.3 g/dL   Albumin 3.4 (L) 3.5 - 5.0 g/dL   Calcium 9.0 8.4 - 10.4 mg/dL   Anion Gap 9 3 - 11 mEq/L   EGFR 82 (L) >90 ml/min/1.73 m2  Magnesium  Result Value Ref Range   Magnesium 2.3 1.5 - 2.5 mg/dl     Studies/Results:  No results found.  Medications: I have reviewed the patient's current medications. Imodium up to 8 tablets in 24 hrs as needed to control radiation diarrhea, let treatment team know if not sufficient  DISCUSSION  Tolerating treatment adequately to continue, which she is in agreement with doing.  Meds as above.     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 Radiation begun 02-26-16 with sensitizing weekly CDDP, planned x 6 cycles thru 04-01-16.  CBC CMET Mg weekly. I will follow coordinating with chemo and radiation. 2 Low grade noninvasive urothelial carcinoma, excisional bladderbiopsy by Dr Karsten Ro 02-16-16. May have beencause of presenting hematuria, vs vaginal bleeding. She is to see Dr Karsten Ro in follow up possibly in 6 months 3.iron deficiency anemia:  vaginal bleeding has stopped. Transfused to improve oxygenation for radiation effect, good improvement. Oral iron as above. Follow History of anemia around hip surgery. 4.post left total hip replacement 12-2014 by Dr Erlinda Hong, after femoral neck fracture in fall. Note healing nonpathologic left rib fractures noted on PET. 5.osteopenia by DEXA 2016. Discussed weight bearing exercise. See #4. 6.past minimal tobacco, DCd 1998 7.recent right cataract extraction, doing well 8.never mammograms, which  we will address when possible 9.up to date on colonoscopy, no polyps, no further exams planned. 10. HTN followed by PCP 11. Needs flu vaccine after completion of chemo 12. Multiple loose stools related to ongoing radiation. Needs good hydration and to increase imodium as above. 13. Chemo extravasation right hand 03-11-16 resolved without problems   Chemo orders confirmed. All questions answered and they know to call if concerns between visits. TIme spent 25 min including >50% counseling and coordination of care. Route PCP, cc Dr Sondra Come   Evlyn Clines, MD   03/14/2016, 4:59 PM

## 2016-03-15 ENCOUNTER — Ambulatory Visit: Payer: Commercial Managed Care - HMO

## 2016-03-16 DIAGNOSIS — C672 Malignant neoplasm of lateral wall of bladder: Secondary | ICD-10-CM | POA: Insufficient documentation

## 2016-03-17 ENCOUNTER — Ambulatory Visit: Payer: Commercial Managed Care - HMO

## 2016-03-18 ENCOUNTER — Other Ambulatory Visit: Payer: Commercial Managed Care - HMO

## 2016-03-18 ENCOUNTER — Emergency Department (HOSPITAL_COMMUNITY)
Admission: EM | Admit: 2016-03-18 | Discharge: 2016-03-19 | Disposition: A | Discharge status: Home / Self Care | Payer: Commercial Managed Care - HMO | Attending: Emergency Medicine | Admitting: Emergency Medicine

## 2016-03-18 ENCOUNTER — Encounter (HOSPITAL_COMMUNITY): Payer: Self-pay

## 2016-03-18 ENCOUNTER — Ambulatory Visit (HOSPITAL_BASED_OUTPATIENT_CLINIC_OR_DEPARTMENT_OTHER): Payer: Commercial Managed Care - HMO

## 2016-03-18 ENCOUNTER — Other Ambulatory Visit (HOSPITAL_BASED_OUTPATIENT_CLINIC_OR_DEPARTMENT_OTHER): Payer: Commercial Managed Care - HMO

## 2016-03-18 ENCOUNTER — Ambulatory Visit: Payer: Commercial Managed Care - HMO

## 2016-03-18 ENCOUNTER — Emergency Department (HOSPITAL_COMMUNITY): Payer: Commercial Managed Care - HMO

## 2016-03-18 VITALS — BP 131/50 | HR 72 | Temp 98.5°F | Resp 18

## 2016-03-18 DIAGNOSIS — R05 Cough: Secondary | ICD-10-CM | POA: Diagnosis not present

## 2016-03-18 DIAGNOSIS — C539 Malignant neoplasm of cervix uteri, unspecified: Secondary | ICD-10-CM | POA: Diagnosis not present

## 2016-03-18 DIAGNOSIS — Z5111 Encounter for antineoplastic chemotherapy: Secondary | ICD-10-CM | POA: Diagnosis not present

## 2016-03-18 DIAGNOSIS — Z79899 Other long term (current) drug therapy: Secondary | ICD-10-CM | POA: Diagnosis not present

## 2016-03-18 DIAGNOSIS — Z8541 Personal history of malignant neoplasm of cervix uteri: Secondary | ICD-10-CM | POA: Diagnosis not present

## 2016-03-18 DIAGNOSIS — R110 Nausea: Secondary | ICD-10-CM | POA: Insufficient documentation

## 2016-03-18 DIAGNOSIS — C531 Malignant neoplasm of exocervix: Secondary | ICD-10-CM

## 2016-03-18 DIAGNOSIS — R21 Rash and other nonspecific skin eruption: Secondary | ICD-10-CM | POA: Diagnosis not present

## 2016-03-18 DIAGNOSIS — Z87891 Personal history of nicotine dependence: Secondary | ICD-10-CM | POA: Diagnosis not present

## 2016-03-18 DIAGNOSIS — I10 Essential (primary) hypertension: Secondary | ICD-10-CM | POA: Diagnosis not present

## 2016-03-18 DIAGNOSIS — H109 Unspecified conjunctivitis: Secondary | ICD-10-CM | POA: Insufficient documentation

## 2016-03-18 DIAGNOSIS — R509 Fever, unspecified: Secondary | ICD-10-CM

## 2016-03-18 DIAGNOSIS — J9811 Atelectasis: Secondary | ICD-10-CM | POA: Diagnosis not present

## 2016-03-18 LAB — CBC WITH DIFFERENTIAL/PLATELET
BASO%: 0.6 % (ref 0.0–2.0)
BASOS ABS: 0 10*3/uL (ref 0.0–0.1)
BASOS ABS: 0 10*3/uL (ref 0.0–0.1)
Basophils Relative: 0 %
EOS PCT: 0 %
EOS%: 4.6 % (ref 0.0–7.0)
Eosinophils Absolute: 0.2 10*3/uL (ref 0.0–0.5)
Eosinophils Absolute: 0 10*3/uL (ref 0.0–0.7)
HCT: 29.9 % — ABNORMAL LOW (ref 36.0–46.0)
HEMATOCRIT: 28.8 % — AB (ref 34.8–46.6)
HEMOGLOBIN: 9.8 g/dL — AB (ref 11.6–15.9)
Hemoglobin: 10 g/dL — ABNORMAL LOW (ref 12.0–15.0)
LYMPH#: 0.6 10*3/uL — AB (ref 0.9–3.3)
LYMPH%: 13.3 % — ABNORMAL LOW (ref 14.0–49.7)
LYMPHS PCT: 1 %
Lymphs Abs: 0.1 10*3/uL — ABNORMAL LOW (ref 0.7–4.0)
MCH: 30.3 pg (ref 25.1–34.0)
MCH: 30.5 pg (ref 26.0–34.0)
MCHC: 34 g/dL (ref 31.5–36.0)
MCHC: 33.4 g/dL (ref 30.0–36.0)
MCV: 89.2 fL (ref 79.5–101.0)
MCV: 91.2 fL (ref 78.0–100.0)
MONO ABS: 0.2 10*3/uL (ref 0.1–1.0)
MONO#: 0.4 10*3/uL (ref 0.1–0.9)
MONO%: 7.7 % (ref 0.0–14.0)
Monocytes Relative: 3 %
NEUT#: 3.6 10*3/uL (ref 1.5–6.5)
NEUT%: 73.8 % (ref 38.4–76.8)
Neutro Abs: 6.7 10*3/uL (ref 1.7–7.7)
Neutrophils Relative %: 96 %
PLATELETS: 155 10*3/uL (ref 145–400)
PLATELETS: 159 10*3/uL (ref 150–400)
RBC: 3.23 10*6/uL — ABNORMAL LOW (ref 3.70–5.45)
RBC: 3.28 MIL/uL — ABNORMAL LOW (ref 3.87–5.11)
RDW: 14.6 % — AB (ref 11.2–14.5)
RDW: 14.8 % (ref 11.5–15.5)
WBC: 4.8 10*3/uL (ref 3.9–10.3)
WBC: 7 10*3/uL (ref 4.0–10.5)

## 2016-03-18 LAB — COMPREHENSIVE METABOLIC PANEL
ALBUMIN: 3.2 g/dL — AB (ref 3.5–5.0)
ALBUMIN: 3.6 g/dL (ref 3.5–5.0)
ALK PHOS: 72 U/L (ref 38–126)
ALT: 20 U/L (ref 0–55)
ALT: 26 U/L (ref 14–54)
ANION GAP: 8 meq/L (ref 3–11)
AST: 13 U/L (ref 5–34)
AST: 29 U/L (ref 15–41)
Alkaline Phosphatase: 85 U/L (ref 40–150)
Anion gap: 7 (ref 5–15)
BILIRUBIN TOTAL: 0.4 mg/dL (ref 0.3–1.2)
BUN: 15 mg/dL (ref 7.0–26.0)
BUN: 14 mg/dL (ref 6–20)
CALCIUM: 8.6 mg/dL (ref 8.4–10.4)
CALCIUM: 8.4 mg/dL — AB (ref 8.9–10.3)
CHLORIDE: 102 meq/L (ref 98–109)
CO2: 26 mEq/L (ref 22–29)
CO2: 24 mmol/L (ref 22–32)
CREATININE: 0.76 mg/dL (ref 0.44–1.00)
Chloride: 104 mmol/L (ref 101–111)
Creatinine: 0.7 mg/dL (ref 0.6–1.1)
EGFR: 79 mL/min/{1.73_m2} — ABNORMAL LOW (ref >90)
GFR calc Af Amer: >60 mL/min (ref >60)
GFR calc non Af Amer: >60 mL/min (ref >60)
GLUCOSE: 103 mg/dL (ref 70–140)
GLUCOSE: 126 mg/dL — AB (ref 65–99)
POTASSIUM: 3.9 meq/L (ref 3.5–5.1)
POTASSIUM: 3.9 mmol/L (ref 3.5–5.1)
Sodium: 136 mEq/L (ref 136–145)
Sodium: 135 mmol/L (ref 135–145)
TOTAL PROTEIN: 6.6 g/dL (ref 6.5–8.1)
Total Bilirubin: 0.34 mg/dL (ref 0.20–1.20)
Total Protein: 6.2 g/dL — ABNORMAL LOW (ref 6.4–8.3)

## 2016-03-18 LAB — URINALYSIS, ROUTINE W REFLEX MICROSCOPIC
Bilirubin Urine: NEGATIVE
GLUCOSE, UA: NEGATIVE mg/dL
HGB URINE DIPSTICK: NEGATIVE
KETONES UR: NEGATIVE mg/dL
Nitrite: NEGATIVE
PROTEIN: NEGATIVE mg/dL
Specific Gravity, Urine: 1.011 (ref 1.005–1.030)
pH: 6 (ref 5.0–8.0)

## 2016-03-18 LAB — MAGNESIUM: Magnesium: 2.4 mg/dl (ref 1.5–2.5)

## 2016-03-18 LAB — URINE MICROSCOPIC-ADD ON

## 2016-03-18 LAB — LIPASE, BLOOD: Lipase: 44 U/L (ref 11–51)

## 2016-03-18 LAB — I-STAT CG4 LACTIC ACID, ED: Lactic Acid, Venous: 1.03 mmol/L (ref 0.5–1.9)

## 2016-03-18 MED ORDER — SODIUM CHLORIDE 0.9 % IV SOLN
Freq: Once | INTRAVENOUS | Status: AC
  Administered 2016-03-18: 11:00:00 via INTRAVENOUS
  Filled 2016-03-18: qty 5

## 2016-03-18 MED ORDER — SODIUM CHLORIDE 0.9 % IV BOLUS (SEPSIS): 250.0000 mL | Freq: Once | INTRAVENOUS | Status: DC

## 2016-03-18 MED ORDER — SODIUM CHLORIDE 0.9 % IV SOLN
Freq: Once | INTRAVENOUS | Status: AC
  Administered 2016-03-18: 09:00:00 via INTRAVENOUS

## 2016-03-18 MED ORDER — SODIUM CHLORIDE 0.9 % IV SOLN
Freq: Once | INTRAVENOUS | Status: AC
  Administered 2016-03-18: 11:00:00 via INTRAVENOUS
  Filled 2016-03-18: qty 4

## 2016-03-18 MED ORDER — SODIUM CHLORIDE 0.9 % IV BOLUS (SEPSIS)
1000.0000 mL | Freq: Once | INTRAVENOUS | Status: AC
  Administered 2016-03-18: 1000 mL via INTRAVENOUS

## 2016-03-18 MED ORDER — POTASSIUM CHLORIDE 2 MEQ/ML IV SOLN
Freq: Once | INTRAVENOUS | Status: AC
  Administered 2016-03-18: 10:00:00 via INTRAVENOUS
  Filled 2016-03-18: qty 10

## 2016-03-18 MED ORDER — SODIUM CHLORIDE 0.9 % IV SOLN
40.0000 mg/m2 | Freq: Once | INTRAVENOUS | Status: AC
  Administered 2016-03-18: 72 mg via INTRAVENOUS
  Filled 2016-03-18: qty 72

## 2016-03-18 MED ORDER — ACETAMINOPHEN 500 MG PO TABS
1000.0000 mg | ORAL_TABLET | Freq: Once | ORAL | Status: AC
  Administered 2016-03-18: 1000 mg via ORAL
  Filled 2016-03-18: qty 2

## 2016-03-18 NOTE — ED Provider Notes (Signed)
[facility] DEPT Provider Note   CSN: [id number] Arrival date & time: 03/18/16  1955     History   Chief Complaint Chief Complaint  Patient presents with  . Fever  . Nausea    HPI Sherri Mendez is a 80 y.o. female who   has a past medical history of Anemia; Bladder tumor; Cervical cancer [facility]) (dx 01-31-2016--  oncologist-  dr livesay/  dr Sondra Come); Gross hematuria; History of rib fracture; Hypertension; Lower urinary tract symptoms (LUTS); Osteopenia; and Wears glasses.  She presents for nausea and fever. She is currently under treatment for cervical cancer. She was diagnosed 4 weeks ago and is on the 4th week of chemo. She had a dose this morning. When she cam e home she developed severe nausea and a fever of 100.5. She has not had this reaction to prior chemo treatments. She denies urinary or abdominal symptoms. She is also feeling very weak and light headed.  Denies fevers, chills, myalgias, arthralgias. Denies DOE, SOB, chest tightness or pressure, radiation to left arm, jaw or back, or diaphoresis. Denies dysuria, flank pain, suprapubic pain, frequency, urgency, or hematuria. Denies headaches,, visual disturbances. Denies abdominal pain, , vomiting, diarrhea or constipation.   HPI  Past Medical History:  Diagnosis Date  . Anemia   . Bladder tumor   . Cervical cancer [facility]) dx 01-31-2016--  oncologist-  dr livesay/  dr Sondra Come   FIGO Stage IIB, Invasive poorly differeniated sqaumous cell carcinoma, involiving bilateral external iliac nodes   . Gross hematuria   . History of rib fracture    left side  . Hypertension   . Lower urinary tract symptoms (LUTS)   . Osteopenia   . Wears glasses     Patient Active Problem List   Diagnosis Date Noted  . Malignant neoplasm of lateral wall of urinary bladder ([place]) 03/16/2016  . Extravasation, infusion or chemotherapeutic agent 03/13/2016  . Postmenopausal vaginal bleeding 02/28/2016  . Other iron deficiency anemias 02/28/2016    . Malignant neoplasm of exocervix ([place]) 01/31/2016  . Fracture of femoral neck, left ([place]) 12/28/2014  . Leukocytosis   . Elevated blood pressure     Past Surgical History:  Procedure Laterality Date  . CATARACT EXTRACTION W/ INTRAOCULAR LENS IMPLANT Right 10/2015  . COLONOSCOPY  2016  . CYSTOSCOPY WITH BIOPSY N/A 02/16/2016   Procedure: CYSTOSCOPY WITH  BLADDER BIOPSY;  Surgeon: [name], MD;  Location: [facility];  Service: Urology;  Laterality: N/A;  . ORIF RIGHT ANKLE FX  2006   retined hardware  . TOTAL HIP ARTHROPLASTY Left 12/28/2014   Procedure: TOTAL HIP ARTHROPLASTY ANTERIOR APPROACH;  Surgeon: Leandrew Koyanagi, MD;  Location: [facility];  Service: Orthopedics;  Laterality: Left;  femur fracture    OB History    No data available       Home Medications    Prior to Admission medications   Medication Sig Start Date End Date Taking? Authorizing Provider  acetaminophen (TYLENOL) 325 MG tablet Take 650 mg by mouth every 6 (six) hours as needed for mild pain, moderate pain or headache.    Yes Historical Provider, MD  amLODipine (NORVASC) 5 MG tablet Take 5 mg by mouth at bedtime.   Yes Historical Provider, MD  ferrous fumarate (HEMOCYTE - 106 MG FE) 325 (106 Fe) MG TABS tablet Take 1 tablet by mouth 2 (two) times daily.   Yes Historical Provider, MD  loperamide (IMODIUM) 2 MG capsule Take 2-4 mg by mouth as  needed for diarrhea or loose stools.   Yes Historical Provider, MD  ondansetron (ZOFRAN) 8 MG tablet Take 1 tablet (8 mg total) by mouth every 8 (eight) hours as needed for nausea. 02/14/16  Yes Lennis Marion Downer, MD  phenazopyridine (PYRIDIUM) 97 MG tablet Take 97 mg by mouth 3 (three) times daily.   Yes Historical Provider, MD  prochlorperazine (COMPAZINE) 10 MG tablet Take 1 tablet (10 mg total) by mouth every 6 (six) hours as needed for nausea or vomiting. 02/14/16  Yes Lennis Marion Downer, MD    Family History Family History  Problem Relation Age of Onset  .  Melanoma Brother   . Cancer Maternal Aunt     Social History Social History  Substance Use Topics  . Smoking status: Former Smoker    Years: 3.00    Quit date: 01/30/1997  . Smokeless tobacco: Never Used     Comment: social smoker  . Alcohol use 2.4 oz/week    4 Glasses of wine per week     Allergies   Sulfa antibiotics   Review of Systems Review of Systems Ten systems reviewed and are negative for acute change, except as noted in the HPI.    Physical Exam Updated Vital Signs BP 108/96 (BP Location: Right Arm)   Pulse 108   Temp 99 F (37.2 C) (Oral)   Resp 18   SpO2 98%   Physical Exam  Constitutional: She is oriented to person, place, and time. She appears well-developed and well-nourished. No distress.  HENT:  Head: Normocephalic and atraumatic.  Eyes: EOM are normal. Pupils are equal, round, and reactive to light. Left conjunctiva is injected. No scleral icterus.    Neck: Normal range of motion.  Cardiovascular: Normal rate, regular rhythm and normal heart sounds.  Exam reveals no gallop and no friction rub.   No murmur heard. Pulmonary/Chest: Effort normal and breath sounds normal. No respiratory distress.  Abdominal: Soft. Bowel sounds are normal. She exhibits no distension and no mass. There is no tenderness. There is no guarding.  Neurological: She is alert and oriented to person, place, and time.  Skin: Skin is warm and dry. She is not diaphoretic.  Nursing note and vitals reviewed.    ED Treatments / Results  Labs (all labs ordered are listed, but only abnormal results are displayed) Labs Reviewed  CULTURE, BLOOD (ROUTINE X 2)  CULTURE, BLOOD (ROUTINE X 2)  URINE CULTURE  COMPREHENSIVE METABOLIC PANEL  CBC WITH DIFFERENTIAL/PLATELET  URINALYSIS, ROUTINE W REFLEX MICROSCOPIC (NOT AT [facility])  LIPASE, BLOOD  I-STAT CG4 LACTIC ACID, ED    EKG  EKG Interpretation None       Radiology Dg Chest 2 View  Result Date: 03/18/2016 CLINICAL DATA:   Fever today following chemotherapy. EXAM: CHEST  2 VIEW COMPARISON:  Chest CT 02/06/2016 FINDINGS: The cardiac silhouette, mediastinal and hilar contours are within normal limits. Mild tortuosity of the thoracic aorta. Streaky subsegmental atelectasis in the left lower lobe. No infiltrates or effusions. Stable apical scarring changes. The bony thorax is intact. IMPRESSION: No acute cardiopulmonary findings. Minimal streaky left basilar atelectasis. Electronically Signed   By: Marijo Sanes M.D.   On: 03/18/2016 21:18    Procedures Procedures (including critical care time)  Medications Ordered in ED Medications  sodium chloride 0.9 % bolus 1,000 mL (1,000 mLs Intravenous New Bag/Given 03/18/16 2119)    And  sodium chloride 0.9 % bolus 1,000 mL (1,000 mLs Intravenous New Bag/Given 03/18/16 2119)  And  sodium chloride 0.9 % bolus 250 mL (not administered)     Initial Impression / Assessment and Plan / ED Course  I have reviewed the triage vital signs and the nursing notes.  Pertinent labs & imaging results that were available during my care of the patient were reviewed by me and considered in my medical decision making (see chart for details).  Clinical Course    Patient with fever, treated with fluids, Tylenol with resolution of pyrexia. Labs are without acute abnormality. The patient may be having allergic or adverse reaction to her cisplatin. Patient may also be developing viral infection. Labs are otherwise well appearing. Her flu swab (date. Patient is encouraged to follow up with Dr. Marko Plume tomorrow. Seen in shared visit with Dr. Eulis Foster. She appears safe for discharge at this time  Final Clinical Impressions(s) / ED Diagnoses   Final diagnoses:  None    New Prescriptions New Prescriptions   No medications on file     Margarita Mail, PA-C 03/19/16 [place], MD 03/19/16 [phone]

## 2016-03-18 NOTE — Patient Instructions (Signed)
Cisplatin injection What is this medicine? CISPLATIN (SIS pla tin) is a chemotherapy drug. It targets fast dividing cells, like cancer cells, and causes these cells to die. This medicine is used to treat many types of cancer like bladder, ovarian, and testicular cancers. This medicine may be used for other purposes; ask your health care provider or pharmacist if you have questions. What should I tell my health care provider before I take this medicine? They need to know if you have any of these conditions: -blood disorders -hearing problems -kidney disease -recent or ongoing radiation therapy -an unusual or allergic reaction to cisplatin, carboplatin, other chemotherapy, other medicines, foods, dyes, or preservatives -pregnant or trying to get pregnant -breast-feeding How should I use this medicine? This drug is given as an infusion into a vein. It is administered in a hospital or clinic by a specially trained health care professional. Talk to your pediatrician regarding the use of this medicine in children. Special care may be needed. Overdosage: If you think you have taken too much of this medicine contact a poison control center or emergency room at once. NOTE: This medicine is only for you. Do not share this medicine with others. What if I miss a dose? It is important not to miss a dose. Call your doctor or health care professional if you are unable to keep an appointment. What may interact with this medicine? -dofetilide -foscarnet -medicines for seizures -medicines to increase blood counts like filgrastim, pegfilgrastim, sargramostim -probenecid -pyridoxine used with altretamine -rituximab -some antibiotics like amikacin, gentamicin, neomycin, polymyxin B, streptomycin, tobramycin -sulfinpyrazone -vaccines -zalcitabine Talk to your doctor or health care professional before taking any of these medicines: -acetaminophen -aspirin -ibuprofen -ketoprofen -naproxen This list may  not describe all possible interactions. Give your health care provider a list of all the medicines, herbs, non-prescription drugs, or dietary supplements you use. Also tell them if you smoke, drink alcohol, or use illegal drugs. Some items may interact with your medicine. What should I watch for while using this medicine? Your condition will be monitored carefully while you are receiving this medicine. You will need important blood work done while you are taking this medicine. This drug may make you feel generally unwell. This is not uncommon, as chemotherapy can affect healthy cells as well as cancer cells. Report any side effects. Continue your course of treatment even though you feel ill unless your doctor tells you to stop. In some cases, you may be given additional medicines to help with side effects. Follow all directions for their use. Call your doctor or health care professional for advice if you get a fever, chills or sore throat, or other symptoms of a cold or flu. Do not treat yourself. This drug decreases your body's ability to fight infections. Try to avoid being around people who are sick. This medicine may increase your risk to bruise or bleed. Call your doctor or health care professional if you notice any unusual bleeding. Be careful brushing and flossing your teeth or using a toothpick because you may get an infection or bleed more easily. If you have any dental work done, tell your dentist you are receiving this medicine. Avoid taking products that contain aspirin, acetaminophen, ibuprofen, naproxen, or ketoprofen unless instructed by your doctor. These medicines may hide a fever. Do not become pregnant while taking this medicine. Women should inform their doctor if they wish to become pregnant or think they might be pregnant. There is a potential for serious side effects to   an unborn child. Talk to your health care professional or pharmacist for more information. Do not breast-feed an  infant while taking this medicine. Drink fluids as directed while you are taking this medicine. This will help protect your kidneys. Call your doctor or health care professional if you get diarrhea. Do not treat yourself. What side effects may I notice from receiving this medicine? Side effects that you should report to your doctor or health care professional as soon as possible: -allergic reactions like skin rash, itching or hives, swelling of the face, lips, or tongue -signs of infection - fever or chills, cough, sore throat, pain or difficulty passing urine -signs of decreased platelets or bleeding - bruising, pinpoint red spots on the skin, black, tarry stools, nosebleeds -signs of decreased red blood cells - unusually weak or tired, fainting spells, lightheadedness -breathing problems -changes in hearing -gout pain -low blood counts - This drug may decrease the number of white blood cells, red blood cells and platelets. You may be at increased risk for infections and bleeding. -nausea and vomiting -pain, swelling, redness or irritation at the injection site -pain, tingling, numbness in the hands or feet -problems with balance, movement -trouble passing urine or change in the amount of urine Side effects that usually do not require medical attention (report to your doctor or health care professional if they continue or are bothersome): -changes in vision -loss of appetite -metallic taste in the mouth or changes in taste This list may not describe all possible side effects. Call your doctor for medical advice about side effects. You may report side effects to FDA at 1-800-FDA-1088. Where should I keep my medicine? This drug is given in a hospital or clinic and will not be stored at home. NOTE: This sheet is a summary. It may not cover all possible information. If you have questions about this medicine, talk to your doctor, pharmacist, or health care provider.    2016, [employer]vier/Gold  Standard. (2007-09-22 14:40:54)  

## 2016-03-18 NOTE — ED Triage Notes (Signed)
Pt had chemo today and after being home awhile she developed a fever, feels nauseated and weak

## 2016-03-18 NOTE — ED Provider Notes (Signed)
  Face-to-face evaluation   History: She developed low-grade fever tonight, following chemotherapy today. She had a fourth chemotherapy treatment, receiving every week, for cervical cancer. She denies shortness of breath, nausea, vomiting, weakness or dizziness. No prior similar problem.  Physical exam: Alert, calm, cooperative. Trunk with flat, red macular rash, without associated petechiae. No rash or swelling in the oropharynx or lips. Lungs clear without wheezes, Rales or rhonchi.  Medical screening examination/treatment/procedure(s) were conducted as a shared visit with non-physician practitioner(s) and myself.  I personally evaluated the patient during the encounter   Daleen Bo, MD 03/19/16 [phone]

## 2016-03-18 NOTE — Progress Notes (Signed)
Third check completed on pt's R hand extravasation. No erythema, edema, skin tears, or pain noted. Pt states that she is not having any trouble with her mobility or function of her R hand.

## 2016-03-19 ENCOUNTER — Ambulatory Visit
Admission: RE | Admit: 2016-03-19 | Discharge: 2016-03-19 | Disposition: A | Discharge status: Home / Self Care | Payer: Commercial Managed Care - HMO | Source: Ambulatory Visit | Attending: Radiation Oncology | Admitting: Radiation Oncology

## 2016-03-19 ENCOUNTER — Telehealth: Payer: Self-pay | Admitting: *Deleted

## 2016-03-19 ENCOUNTER — Telehealth: Payer: Self-pay

## 2016-03-19 ENCOUNTER — Encounter: Payer: Self-pay | Admitting: Radiation Oncology

## 2016-03-19 VITALS — BP 134/71 | HR 86 | Temp 99.0°F | Ht 64.5 in | Wt 159.3 lb

## 2016-03-19 DIAGNOSIS — Z8541 Personal history of malignant neoplasm of cervix uteri: Secondary | ICD-10-CM | POA: Diagnosis not present

## 2016-03-19 DIAGNOSIS — Z510 Encounter for antineoplastic radiation therapy: Secondary | ICD-10-CM | POA: Diagnosis not present

## 2016-03-19 DIAGNOSIS — C539 Malignant neoplasm of cervix uteri, unspecified: Secondary | ICD-10-CM | POA: Diagnosis not present

## 2016-03-19 DIAGNOSIS — Z87891 Personal history of nicotine dependence: Secondary | ICD-10-CM | POA: Diagnosis not present

## 2016-03-19 DIAGNOSIS — C531 Malignant neoplasm of exocervix: Secondary | ICD-10-CM | POA: Diagnosis not present

## 2016-03-19 DIAGNOSIS — H109 Unspecified conjunctivitis: Secondary | ICD-10-CM | POA: Diagnosis not present

## 2016-03-19 DIAGNOSIS — Z79899 Other long term (current) drug therapy: Secondary | ICD-10-CM | POA: Diagnosis not present

## 2016-03-19 DIAGNOSIS — Z9889 Other specified postprocedural states: Secondary | ICD-10-CM | POA: Diagnosis not present

## 2016-03-19 DIAGNOSIS — Z96642 Presence of left artificial hip joint: Secondary | ICD-10-CM | POA: Diagnosis not present

## 2016-03-19 DIAGNOSIS — Z808 Family history of malignant neoplasm of other organs or systems: Secondary | ICD-10-CM | POA: Diagnosis not present

## 2016-03-19 DIAGNOSIS — I10 Essential (primary) hypertension: Secondary | ICD-10-CM | POA: Diagnosis not present

## 2016-03-19 DIAGNOSIS — C7989 Secondary malignant neoplasm of other specified sites: Secondary | ICD-10-CM | POA: Diagnosis not present

## 2016-03-19 DIAGNOSIS — Z888 Allergy status to other drugs, medicaments and biological substances status: Secondary | ICD-10-CM | POA: Diagnosis not present

## 2016-03-19 DIAGNOSIS — R110 Nausea: Secondary | ICD-10-CM | POA: Diagnosis not present

## 2016-03-19 LAB — INFLUENZA PANEL BY PCR (TYPE A & B)
H1N1 flu by pcr: NOT DETECTED
INFLBPCR: NEGATIVE
Influenza A By PCR: NEGATIVE

## 2016-03-19 NOTE — Discharge Instructions (Signed)
You may have had an adverse reaction to the chemotherapy today. Please call your oncologist first thing tomorrow to follow up about your ER visit.  A fever means your temperature is over 100.4 F (37.8C). In adults this is most often due to viral or bacterial infections. If a fever lasts for over 2 or 3 days and does not get better with medicine, further medical examination is needed to find the cause. SEEK MEDICAL CARE IF: Your temperature stays around 104 F (40 C) or if you have repeated vomiting, unable to take fluids, breathing problems, a severe headache, confusion, a new rash, abdominal pain, difficulty urinating, become poorly responsive, or any new symptoms.

## 2016-03-19 NOTE — Telephone Encounter (Signed)
Pt stopped by infusion room to have L hand evaluated. Bruise noted to L hand. No signs worrisome for extravasation. Informed pt that bruising will resolve over a few weeks time. She voiced understanding. Pt denied pain at site.  Pt stated she is on the way to pick up Benadryl as instructed by NP.

## 2016-03-19 NOTE — Progress Notes (Signed)
  Radiation Oncology         [phone] ________________________________  Name: Sherri Mendez MRN: [id number]  Date: 03/19/2016  DOB: 02/01/1936  Weekly Radiation Therapy Management    ICD-9-CM ICD-10-CM   1. Malignant neoplasm of exocervix (HCC) 180.1 C53.1      FIGO Stage IIB poorly differentiated squamous cell carcinoma of the cervix with radiographically positive pelvic metastasis   Current Dose: 25.6 Gy     Planned Dose:  54+ Gy   Narrative . . . . . . . . The patient presents for routine under treatment assessment.                     Sherri Mendez has completed 14 fractions to her pelvis.  She denies having any pain, urinary problems.  She reports having diarrhea 1-2 times per day.  She is taking 2-4 imodium tablets per day.  She reports having nausea and is taking zofran and compazine which helps.  She went to the ER last night with a fever of 100.5.  She denies having any rectal/vaginal bleeding.  She reports having fatigue.  She had chemotherapy yesterday. The patient was tested for influenza at the ED last night, which was rendered as negative.  She was given IV fluids at the ED which the patient reports helped her symptoms.                                    Set-up films were reviewed.                                 The chart was checked. Physical Findings. . .  height is 5' 4.5" (1.638 m) and weight is 159 lb 4.8 oz (72.3 kg). Her oral temperature is 99 F (37.2 C). Her blood pressure is 134/71 and her pulse is 86. Her oxygen saturation is 100%. . Weight essentially stable.  No significant changes. Lungs are clear to auscultation bilaterally. Heart has regular rate and rhythm. Abdomen soft, non-tender, normal bowel sounds. Impression . . . . . . . The patient is tolerating radiation. Plan . . . . . . . . . . . . Continue treatment as planned. I told the patient that she could take up to 8 immodium if she needed this. The patient's daughter seemed somewhat worried about her  mother's fever last night, but I told her that if her fever were to spike again, to call the medical oncologist on call.  ________________________________   Blair Promise, PhD, MD  This document serves as a record of services personally performed by Gery Pray, MD. It was created on his behalf by Truddie Hidden, a trained medical scribe. The creation of this record is based on the scribe's personal observations and the provider's statements to them. This document has been checked and approved by the attending provider.

## 2016-03-19 NOTE — Telephone Encounter (Signed)
Spoke with Sherri Mendez to follow up ED visit yesterday. She denies shaking chills. Temperature  this am 98.3-98.2. She took Tylenol at 0900. Feels a little light headed but not like yesterday evening. Received 2 liters of fluid in ED last evening. She has taken in 16 oz this am so far. Not hungery.  Stomach feels a little off.  Told her to take a Zofran tablet now and increase fluid intake to ~10 oz every 90 min. Today. Denies urinary and respiratory symptoms. IV site on left hand from treatment is slightly bruised and puffy.  No swelling in hand. Reviewed information with [name],NP in symptom management clinic.  Cyndee will follow up on the influenza panel and call Sherri Worthy.

## 2016-03-19 NOTE — Progress Notes (Signed)
Sherri Mendez has completed 14 fractions to her pelvis.  She denies having any pain, urinary problems.  She reports having diarrhea 1-2 times per day.  She is taking 2-4 imodium tablets per day.  She reports having nausea and is taking zofran and compazine which helps.  She went to the ER last night with a fever of 100.5.  She denies having any rectal/vaginal bleeding.  She reports having fatigue.  She had chemotherapy yesterday.  BP 134/71 (BP Location: Left Arm, Patient Position: Sitting)   Pulse 86   Temp 99 F (37.2 C) (Oral)   Ht 5' 4.5" (1.638 m)   Wt 159 lb 4.8 oz (72.3 kg)   SpO2 100%   BMI 26.92 kg/m

## 2016-03-19 NOTE — Telephone Encounter (Signed)
Called patient to check in with her to receive a report from nurse.  Patient states that she is feeling that her today than she did last night when she was in the emergency department.  She states that she took Tylenol earlier this morning.  Her temperature is 98.2 now.  She has no nausea, vomiting, or diarrhea.  She has been able to drink some earlier this morning as well.  Patient wanted to report to Dr. Marko Plume that she was noted to have a fine rash to both her chest and her upper back.  Yesterday during the exam in the emergency department.  She denies any pruritus to the rash.  She is taking no over-the-counter medications for treatment of the rash.  Advised patient to take Benadryl 25 mg every 6 hours and Pepcid 20 mg every 12 hours for treatment of rash.  Also advised patient that she should call/return to the [facility] or go directly to the emergency department if she develops any worsening reaction symptoms whatsoever.  Patient wrote down all of the instructions for both Benadryl and the Pepcid use while on the phone with his provider.  Also, awaiting the influenza swab results.  At this time.  Advised patient would call her back as soon as I receive these results.  Also, advised patient that she should come back to the [facility] for further evaluation if she develops any worsening symptoms whatsoever.  Patient stated understanding of all instructions; and was in agreement with this plan of care.

## 2016-03-20 ENCOUNTER — Ambulatory Visit
Admission: RE | Admit: 2016-03-20 | Discharge: 2016-03-20 | Disposition: A | Discharge status: Home / Self Care | Payer: Commercial Managed Care - HMO | Source: Ambulatory Visit | Attending: Radiation Oncology | Admitting: Radiation Oncology

## 2016-03-20 ENCOUNTER — Telehealth: Payer: Self-pay | Admitting: Nurse Practitioner

## 2016-03-20 DIAGNOSIS — Z96642 Presence of left artificial hip joint: Secondary | ICD-10-CM | POA: Diagnosis not present

## 2016-03-20 DIAGNOSIS — I10 Essential (primary) hypertension: Secondary | ICD-10-CM | POA: Diagnosis not present

## 2016-03-20 DIAGNOSIS — Z9889 Other specified postprocedural states: Secondary | ICD-10-CM | POA: Diagnosis not present

## 2016-03-20 DIAGNOSIS — Z888 Allergy status to other drugs, medicaments and biological substances status: Secondary | ICD-10-CM | POA: Diagnosis not present

## 2016-03-20 DIAGNOSIS — Z808 Family history of malignant neoplasm of other organs or systems: Secondary | ICD-10-CM | POA: Diagnosis not present

## 2016-03-20 DIAGNOSIS — C531 Malignant neoplasm of exocervix: Secondary | ICD-10-CM | POA: Diagnosis not present

## 2016-03-20 DIAGNOSIS — C539 Malignant neoplasm of cervix uteri, unspecified: Secondary | ICD-10-CM | POA: Diagnosis not present

## 2016-03-20 DIAGNOSIS — Z79899 Other long term (current) drug therapy: Secondary | ICD-10-CM | POA: Diagnosis not present

## 2016-03-20 DIAGNOSIS — Z510 Encounter for antineoplastic radiation therapy: Secondary | ICD-10-CM | POA: Diagnosis not present

## 2016-03-20 DIAGNOSIS — C7989 Secondary malignant neoplasm of other specified sites: Secondary | ICD-10-CM | POA: Diagnosis not present

## 2016-03-20 LAB — URINE CULTURE

## 2016-03-20 NOTE — Telephone Encounter (Signed)
Called patient back this morning to check in with her.  Advised patient that her flu swab was negative.  Patient states that she was feeling very well today.  Had no new complaints

## 2016-03-21 ENCOUNTER — Ambulatory Visit
Admission: RE | Admit: 2016-03-21 | Discharge: 2016-03-21 | Disposition: A | Discharge status: Home / Self Care | Payer: Commercial Managed Care - HMO | Source: Ambulatory Visit | Attending: Radiation Oncology | Admitting: Radiation Oncology

## 2016-03-21 DIAGNOSIS — Z510 Encounter for antineoplastic radiation therapy: Secondary | ICD-10-CM | POA: Diagnosis not present

## 2016-03-21 DIAGNOSIS — Z79899 Other long term (current) drug therapy: Secondary | ICD-10-CM | POA: Diagnosis not present

## 2016-03-21 DIAGNOSIS — Z888 Allergy status to other drugs, medicaments and biological substances status: Secondary | ICD-10-CM | POA: Diagnosis not present

## 2016-03-21 DIAGNOSIS — I10 Essential (primary) hypertension: Secondary | ICD-10-CM | POA: Diagnosis not present

## 2016-03-21 DIAGNOSIS — Z96642 Presence of left artificial hip joint: Secondary | ICD-10-CM | POA: Diagnosis not present

## 2016-03-21 DIAGNOSIS — C7989 Secondary malignant neoplasm of other specified sites: Secondary | ICD-10-CM | POA: Diagnosis not present

## 2016-03-21 DIAGNOSIS — C531 Malignant neoplasm of exocervix: Secondary | ICD-10-CM | POA: Diagnosis not present

## 2016-03-21 DIAGNOSIS — Z9889 Other specified postprocedural states: Secondary | ICD-10-CM | POA: Diagnosis not present

## 2016-03-21 DIAGNOSIS — Z808 Family history of malignant neoplasm of other organs or systems: Secondary | ICD-10-CM | POA: Diagnosis not present

## 2016-03-21 DIAGNOSIS — C539 Malignant neoplasm of cervix uteri, unspecified: Secondary | ICD-10-CM | POA: Diagnosis not present

## 2016-03-22 ENCOUNTER — Ambulatory Visit
Admission: RE | Admit: 2016-03-22 | Discharge: 2016-03-22 | Disposition: A | Discharge status: Home / Self Care | Payer: Commercial Managed Care - HMO | Source: Ambulatory Visit | Attending: Radiation Oncology | Admitting: Radiation Oncology

## 2016-03-22 DIAGNOSIS — Z9889 Other specified postprocedural states: Secondary | ICD-10-CM | POA: Diagnosis not present

## 2016-03-22 DIAGNOSIS — Z96642 Presence of left artificial hip joint: Secondary | ICD-10-CM | POA: Diagnosis not present

## 2016-03-22 DIAGNOSIS — Z79899 Other long term (current) drug therapy: Secondary | ICD-10-CM | POA: Diagnosis not present

## 2016-03-22 DIAGNOSIS — C7989 Secondary malignant neoplasm of other specified sites: Secondary | ICD-10-CM | POA: Diagnosis not present

## 2016-03-22 DIAGNOSIS — C531 Malignant neoplasm of exocervix: Secondary | ICD-10-CM | POA: Diagnosis not present

## 2016-03-22 DIAGNOSIS — Z510 Encounter for antineoplastic radiation therapy: Secondary | ICD-10-CM | POA: Diagnosis not present

## 2016-03-22 DIAGNOSIS — Z808 Family history of malignant neoplasm of other organs or systems: Secondary | ICD-10-CM | POA: Diagnosis not present

## 2016-03-22 DIAGNOSIS — I10 Essential (primary) hypertension: Secondary | ICD-10-CM | POA: Diagnosis not present

## 2016-03-22 DIAGNOSIS — Z888 Allergy status to other drugs, medicaments and biological substances status: Secondary | ICD-10-CM | POA: Diagnosis not present

## 2016-03-22 DIAGNOSIS — C539 Malignant neoplasm of cervix uteri, unspecified: Secondary | ICD-10-CM | POA: Diagnosis not present

## 2016-03-23 LAB — CULTURE, BLOOD (ROUTINE X 2)
CULTURE: NO GROWTH
Culture: NO GROWTH

## 2016-03-25 ENCOUNTER — Other Ambulatory Visit (HOSPITAL_BASED_OUTPATIENT_CLINIC_OR_DEPARTMENT_OTHER): Payer: Commercial Managed Care - HMO

## 2016-03-25 ENCOUNTER — Ambulatory Visit
Admission: RE | Admit: 2016-03-25 | Discharge: 2016-03-25 | Disposition: A | Discharge status: Home / Self Care | Payer: Commercial Managed Care - HMO | Source: Ambulatory Visit | Attending: Radiation Oncology | Admitting: Radiation Oncology

## 2016-03-25 ENCOUNTER — Ambulatory Visit (HOSPITAL_BASED_OUTPATIENT_CLINIC_OR_DEPARTMENT_OTHER): Payer: Commercial Managed Care - HMO

## 2016-03-25 ENCOUNTER — Other Ambulatory Visit: Payer: Commercial Managed Care - HMO

## 2016-03-25 VITALS — BP 156/61 | HR 74 | Temp 98.6°F | Resp 20

## 2016-03-25 DIAGNOSIS — C539 Malignant neoplasm of cervix uteri, unspecified: Secondary | ICD-10-CM

## 2016-03-25 DIAGNOSIS — Z96642 Presence of left artificial hip joint: Secondary | ICD-10-CM | POA: Diagnosis not present

## 2016-03-25 DIAGNOSIS — Z5111 Encounter for antineoplastic chemotherapy: Secondary | ICD-10-CM | POA: Diagnosis not present

## 2016-03-25 DIAGNOSIS — C7989 Secondary malignant neoplasm of other specified sites: Secondary | ICD-10-CM | POA: Diagnosis not present

## 2016-03-25 DIAGNOSIS — C531 Malignant neoplasm of exocervix: Secondary | ICD-10-CM | POA: Diagnosis not present

## 2016-03-25 DIAGNOSIS — Z808 Family history of malignant neoplasm of other organs or systems: Secondary | ICD-10-CM | POA: Diagnosis not present

## 2016-03-25 DIAGNOSIS — I10 Essential (primary) hypertension: Secondary | ICD-10-CM | POA: Diagnosis not present

## 2016-03-25 DIAGNOSIS — Z9889 Other specified postprocedural states: Secondary | ICD-10-CM | POA: Diagnosis not present

## 2016-03-25 DIAGNOSIS — Z79899 Other long term (current) drug therapy: Secondary | ICD-10-CM | POA: Diagnosis not present

## 2016-03-25 DIAGNOSIS — Z888 Allergy status to other drugs, medicaments and biological substances status: Secondary | ICD-10-CM | POA: Diagnosis not present

## 2016-03-25 DIAGNOSIS — Z510 Encounter for antineoplastic radiation therapy: Secondary | ICD-10-CM | POA: Diagnosis not present

## 2016-03-25 LAB — CBC WITH DIFFERENTIAL/PLATELET
BASO%: 0.7 % (ref 0.0–2.0)
Basophils Absolute: 0 10*3/uL (ref 0.0–0.1)
EOS%: 3.3 % (ref 0.0–7.0)
Eosinophils Absolute: 0.1 10*3/uL (ref 0.0–0.5)
HEMATOCRIT: 26.7 % — AB (ref 34.8–46.6)
HEMOGLOBIN: 9.1 g/dL — AB (ref 11.6–15.9)
LYMPH#: 0.4 10*3/uL — AB (ref 0.9–3.3)
LYMPH%: 10.8 % — ABNORMAL LOW (ref 14.0–49.7)
MCH: 30.6 pg (ref 25.1–34.0)
MCHC: 34 g/dL (ref 31.5–36.0)
MCV: 89.9 fL (ref 79.5–101.0)
MONO#: 0.4 10*3/uL (ref 0.1–0.9)
MONO%: 12.1 % (ref 0.0–14.0)
NEUT%: 73.1 % (ref 38.4–76.8)
NEUTROS ABS: 2.7 10*3/uL (ref 1.5–6.5)
Platelets: 182 10*3/uL (ref 145–400)
RBC: 2.98 10*6/uL — ABNORMAL LOW (ref 3.70–5.45)
RDW: 15.6 % — AB (ref 11.2–14.5)
WBC: 3.7 10*3/uL — AB (ref 3.9–10.3)

## 2016-03-25 LAB — COMPREHENSIVE METABOLIC PANEL
ALT: 20 U/L (ref 0–55)
AST: 11 U/L (ref 5–34)
Albumin: 3.2 g/dL — ABNORMAL LOW (ref 3.5–5.0)
Alkaline Phosphatase: 90 U/L (ref 40–150)
Anion Gap: 10 mEq/L (ref 3–11)
BUN: 9 mg/dL (ref 7.0–26.0)
CALCIUM: 8.6 mg/dL (ref 8.4–10.4)
CHLORIDE: 104 meq/L (ref 98–109)
CO2: 25 meq/L (ref 22–29)
CREATININE: 0.7 mg/dL (ref 0.6–1.1)
EGFR: 83 mL/min/{1.73_m2} — ABNORMAL LOW (ref >90)
GLUCOSE: 101 mg/dL (ref 70–140)
Potassium: 3.8 mEq/L (ref 3.5–5.1)
SODIUM: 139 meq/L (ref 136–145)
Total Bilirubin: 0.47 mg/dL (ref 0.20–1.20)
Total Protein: 6.5 g/dL (ref 6.4–8.3)

## 2016-03-25 LAB — MAGNESIUM: Magnesium: 2.1 mg/dl (ref 1.5–2.5)

## 2016-03-25 LAB — TECHNOLOGIST REVIEW

## 2016-03-25 MED ORDER — SODIUM CHLORIDE 0.9 % IV SOLN
Freq: Once | INTRAVENOUS | Status: AC
  Administered 2016-03-25: 11:00:00 via INTRAVENOUS
  Filled 2016-03-25: qty 4

## 2016-03-25 MED ORDER — SODIUM CHLORIDE 0.9 % IV SOLN
40.0000 mg/m2 | Freq: Once | INTRAVENOUS | Status: AC
  Administered 2016-03-25: 72 mg via INTRAVENOUS
  Filled 2016-03-25: qty 72

## 2016-03-25 MED ORDER — DEXTROSE-NACL 5-0.45 % IV SOLN
Freq: Once | INTRAVENOUS | Status: AC
  Administered 2016-03-25: 09:00:00 via INTRAVENOUS
  Filled 2016-03-25: qty 10

## 2016-03-25 MED ORDER — SODIUM CHLORIDE 0.9 % IV SOLN
Freq: Once | INTRAVENOUS | Status: AC
  Administered 2016-03-25: 09:00:00 via INTRAVENOUS

## 2016-03-25 MED ORDER — SODIUM CHLORIDE 0.9 % IV SOLN
Freq: Once | INTRAVENOUS | Status: AC
  Administered 2016-03-25: 12:00:00 via INTRAVENOUS
  Filled 2016-03-25: qty 5

## 2016-03-25 NOTE — Patient Instructions (Signed)
[facility] Discharge Instructions for Patients Receiving Chemotherapy  Today you received the following chemotherapy agents Cisplatin  To help prevent nausea and vomiting after your treatment, we encourage you to take your nausea medication   If you develop nausea and vomiting that is not controlled by your nausea medication, call the clinic.   BELOW ARE SYMPTOMS THAT SHOULD BE REPORTED IMMEDIATELY:  *FEVER GREATER THAN 100.5 F  *CHILLS WITH OR WITHOUT FEVER  NAUSEA AND VOMITING THAT IS NOT CONTROLLED WITH YOUR NAUSEA MEDICATION  *UNUSUAL SHORTNESS OF BREATH  *UNUSUAL BRUISING OR BLEEDING  TENDERNESS IN MOUTH AND THROAT WITH OR WITHOUT PRESENCE OF ULCERS  *URINARY PROBLEMS  *BOWEL PROBLEMS  UNUSUAL RASH Items with * indicate a potential emergency and should be followed up as soon as possible.  Feel free to call the clinic you have any questions or concerns. The clinic phone number is [phone].  Please show the CHEMO ALERT CARD at check-in to the Emergency Department and triage nurse.   

## 2016-03-26 ENCOUNTER — Ambulatory Visit
Admission: RE | Admit: 2016-03-26 | Discharge: 2016-03-26 | Disposition: A | Discharge status: Home / Self Care | Payer: Commercial Managed Care - HMO | Source: Ambulatory Visit | Attending: Radiation Oncology | Admitting: Radiation Oncology

## 2016-03-26 VITALS — BP 119/48 | HR 80 | Temp 97.9°F | Resp 18 | Ht 64.5 in | Wt 155.6 lb

## 2016-03-26 DIAGNOSIS — Z96642 Presence of left artificial hip joint: Secondary | ICD-10-CM | POA: Diagnosis not present

## 2016-03-26 DIAGNOSIS — C531 Malignant neoplasm of exocervix: Secondary | ICD-10-CM | POA: Diagnosis not present

## 2016-03-26 DIAGNOSIS — Z9889 Other specified postprocedural states: Secondary | ICD-10-CM | POA: Diagnosis not present

## 2016-03-26 DIAGNOSIS — Z888 Allergy status to other drugs, medicaments and biological substances status: Secondary | ICD-10-CM | POA: Diagnosis not present

## 2016-03-26 DIAGNOSIS — I10 Essential (primary) hypertension: Secondary | ICD-10-CM | POA: Diagnosis not present

## 2016-03-26 DIAGNOSIS — Z510 Encounter for antineoplastic radiation therapy: Secondary | ICD-10-CM | POA: Diagnosis not present

## 2016-03-26 DIAGNOSIS — C539 Malignant neoplasm of cervix uteri, unspecified: Secondary | ICD-10-CM | POA: Diagnosis not present

## 2016-03-26 DIAGNOSIS — Z808 Family history of malignant neoplasm of other organs or systems: Secondary | ICD-10-CM | POA: Diagnosis not present

## 2016-03-26 DIAGNOSIS — Z79899 Other long term (current) drug therapy: Secondary | ICD-10-CM | POA: Diagnosis not present

## 2016-03-26 DIAGNOSIS — C7989 Secondary malignant neoplasm of other specified sites: Secondary | ICD-10-CM | POA: Diagnosis not present

## 2016-03-26 NOTE — Progress Notes (Signed)
  Radiation Oncology         [phone] ________________________________  Name: Sherri Mendez MRN: [id number]  Date: 03/26/2016  DOB: 1935/07/02  Weekly Radiation Therapy Management    ICD-9-CM ICD-10-CM   1. Malignant neoplasm of exocervix (HCC) 180.1 C53.1      Current Dose: 34.2 Gy     Planned Dose:  54+ Gy  Narrative . . . . . . . . The patient presents for routine under treatment assessment.                                 Sherri Mendez has completed 19 fractions to her pelvis. She denies having any pain, urinary problems. She reports having diarrhea 2 times per day. She is taking 2-3 imodium tablets per day. She reports having nausea and is taking zofran a which helps. She denies having any rectal/vaginal bleeding. She reports having fatigue. She had chemotherapy yesterday                                   Set-up films were reviewed.                                 The chart was checked. Physical Findings. . .  height is 5' 4.5" (1.638 m) and weight is 155 lb 9.6 oz (70.6 kg). Her oral temperature is 97.9 F (36.6 C). Her blood pressure is 119/48 (abnormal) and her pulse is 80. Her respiration is 18 and oxygen saturation is 99%. . The lungs are clear. The heart has a regular rhythm and rate your the abdomen is soft and nontender with normal bowel sounds. Impression . . . . . . . The patient is tolerating radiation. Plan . . . . . . . . . . . . Continue treatment as planned.  ________________________________   Blair Promise, PhD, MD

## 2016-03-26 NOTE — Progress Notes (Signed)
Sherri Mendez has completed 19 fractions to her pelvis.  She denies having any pain, urinary problems.  She reports having diarrhea 2 times per day.  She is taking 2-3 imodium tablets per day.  She reports having nausea and is taking zofran a which helps.   She denies having any rectal/vaginal bleeding.  She reports having fatigue.  She had chemotherapy yesterday Wt Readings from Last 3 Encounters:  03/26/16 155 lb 9.6 oz (70.6 kg)  03/19/16 159 lb 4.8 oz (72.3 kg)  03/14/16 150 lb 12.8 oz (68.4 kg)  BP (!) 119/48 (BP Location: Left Arm, Patient Position: Sitting, Cuff Size: Normal)   Pulse 80   Temp 97.9 F (36.6 C) (Oral)   Resp 18   Ht 5' 4.5" (1.638 m)   Wt 155 lb 9.6 oz (70.6 kg)   SpO2 99%   BMI 26.30 kg/m

## 2016-03-27 ENCOUNTER — Other Ambulatory Visit: Payer: Self-pay | Admitting: Radiation Oncology

## 2016-03-27 ENCOUNTER — Ambulatory Visit
Admission: RE | Admit: 2016-03-27 | Discharge: 2016-03-27 | Disposition: A | Discharge status: Home / Self Care | Payer: Commercial Managed Care - HMO | Source: Ambulatory Visit | Attending: Radiation Oncology | Admitting: Radiation Oncology

## 2016-03-27 DIAGNOSIS — Z96642 Presence of left artificial hip joint: Secondary | ICD-10-CM | POA: Diagnosis not present

## 2016-03-27 DIAGNOSIS — Z510 Encounter for antineoplastic radiation therapy: Secondary | ICD-10-CM | POA: Diagnosis not present

## 2016-03-27 DIAGNOSIS — C539 Malignant neoplasm of cervix uteri, unspecified: Secondary | ICD-10-CM

## 2016-03-27 DIAGNOSIS — Z888 Allergy status to other drugs, medicaments and biological substances status: Secondary | ICD-10-CM | POA: Diagnosis not present

## 2016-03-27 DIAGNOSIS — Z9889 Other specified postprocedural states: Secondary | ICD-10-CM | POA: Diagnosis not present

## 2016-03-27 DIAGNOSIS — Z808 Family history of malignant neoplasm of other organs or systems: Secondary | ICD-10-CM | POA: Diagnosis not present

## 2016-03-27 DIAGNOSIS — C531 Malignant neoplasm of exocervix: Secondary | ICD-10-CM | POA: Diagnosis not present

## 2016-03-27 DIAGNOSIS — Z79899 Other long term (current) drug therapy: Secondary | ICD-10-CM | POA: Diagnosis not present

## 2016-03-27 DIAGNOSIS — I10 Essential (primary) hypertension: Secondary | ICD-10-CM | POA: Diagnosis not present

## 2016-03-27 DIAGNOSIS — C7989 Secondary malignant neoplasm of other specified sites: Secondary | ICD-10-CM | POA: Diagnosis not present

## 2016-03-28 ENCOUNTER — Ambulatory Visit
Admission: RE | Admit: 2016-03-28 | Discharge: 2016-03-28 | Disposition: A | Discharge status: Home / Self Care | Payer: Commercial Managed Care - HMO | Source: Ambulatory Visit | Attending: Radiation Oncology | Admitting: Radiation Oncology

## 2016-03-28 DIAGNOSIS — I10 Essential (primary) hypertension: Secondary | ICD-10-CM | POA: Diagnosis not present

## 2016-03-28 DIAGNOSIS — Z79899 Other long term (current) drug therapy: Secondary | ICD-10-CM | POA: Diagnosis not present

## 2016-03-28 DIAGNOSIS — C539 Malignant neoplasm of cervix uteri, unspecified: Secondary | ICD-10-CM | POA: Diagnosis not present

## 2016-03-28 DIAGNOSIS — Z96642 Presence of left artificial hip joint: Secondary | ICD-10-CM | POA: Diagnosis not present

## 2016-03-28 DIAGNOSIS — Z808 Family history of malignant neoplasm of other organs or systems: Secondary | ICD-10-CM | POA: Diagnosis not present

## 2016-03-28 DIAGNOSIS — C7989 Secondary malignant neoplasm of other specified sites: Secondary | ICD-10-CM | POA: Diagnosis not present

## 2016-03-28 DIAGNOSIS — Z9889 Other specified postprocedural states: Secondary | ICD-10-CM | POA: Diagnosis not present

## 2016-03-28 DIAGNOSIS — Z888 Allergy status to other drugs, medicaments and biological substances status: Secondary | ICD-10-CM | POA: Diagnosis not present

## 2016-03-28 DIAGNOSIS — Z510 Encounter for antineoplastic radiation therapy: Secondary | ICD-10-CM | POA: Diagnosis not present

## 2016-03-28 DIAGNOSIS — C531 Malignant neoplasm of exocervix: Secondary | ICD-10-CM | POA: Diagnosis not present

## 2016-03-29 ENCOUNTER — Ambulatory Visit
Admission: RE | Admit: 2016-03-29 | Discharge: 2016-03-29 | Disposition: A | Discharge status: Home / Self Care | Payer: Commercial Managed Care - HMO | Source: Ambulatory Visit | Attending: Radiation Oncology | Admitting: Radiation Oncology

## 2016-03-29 DIAGNOSIS — Z510 Encounter for antineoplastic radiation therapy: Secondary | ICD-10-CM | POA: Diagnosis not present

## 2016-03-29 DIAGNOSIS — Z9889 Other specified postprocedural states: Secondary | ICD-10-CM | POA: Diagnosis not present

## 2016-03-29 DIAGNOSIS — Z79899 Other long term (current) drug therapy: Secondary | ICD-10-CM | POA: Diagnosis not present

## 2016-03-29 DIAGNOSIS — C7989 Secondary malignant neoplasm of other specified sites: Secondary | ICD-10-CM | POA: Diagnosis not present

## 2016-03-29 DIAGNOSIS — I10 Essential (primary) hypertension: Secondary | ICD-10-CM | POA: Diagnosis not present

## 2016-03-29 DIAGNOSIS — C539 Malignant neoplasm of cervix uteri, unspecified: Secondary | ICD-10-CM | POA: Diagnosis not present

## 2016-03-29 DIAGNOSIS — C531 Malignant neoplasm of exocervix: Secondary | ICD-10-CM | POA: Diagnosis not present

## 2016-03-29 DIAGNOSIS — Z96642 Presence of left artificial hip joint: Secondary | ICD-10-CM | POA: Diagnosis not present

## 2016-03-29 DIAGNOSIS — Z808 Family history of malignant neoplasm of other organs or systems: Secondary | ICD-10-CM | POA: Diagnosis not present

## 2016-03-29 DIAGNOSIS — Z888 Allergy status to other drugs, medicaments and biological substances status: Secondary | ICD-10-CM | POA: Diagnosis not present

## 2016-03-31 ENCOUNTER — Other Ambulatory Visit: Payer: Self-pay | Admitting: Oncology

## 2016-04-01 ENCOUNTER — Ambulatory Visit (HOSPITAL_BASED_OUTPATIENT_CLINIC_OR_DEPARTMENT_OTHER): Payer: Commercial Managed Care - HMO

## 2016-04-01 ENCOUNTER — Other Ambulatory Visit: Payer: Self-pay

## 2016-04-01 ENCOUNTER — Ambulatory Visit: Payer: Commercial Managed Care - HMO

## 2016-04-01 ENCOUNTER — Ambulatory Visit (HOSPITAL_BASED_OUTPATIENT_CLINIC_OR_DEPARTMENT_OTHER): Payer: Commercial Managed Care - HMO | Admitting: Oncology

## 2016-04-01 ENCOUNTER — Ambulatory Visit (HOSPITAL_COMMUNITY)
Admission: RE | Admit: 2016-04-01 | Discharge: 2016-04-01 | Disposition: A | Discharge status: Home / Self Care | Payer: Commercial Managed Care - HMO | Source: Ambulatory Visit | Attending: Oncology | Admitting: Oncology

## 2016-04-01 ENCOUNTER — Ambulatory Visit
Admission: RE | Admit: 2016-04-01 | Discharge: 2016-04-01 | Disposition: A | Discharge status: Home / Self Care | Payer: Commercial Managed Care - HMO | Source: Ambulatory Visit | Attending: Radiation Oncology | Admitting: Radiation Oncology

## 2016-04-01 ENCOUNTER — Ambulatory Visit: Payer: Commercial Managed Care - HMO | Admitting: Nurse Practitioner

## 2016-04-01 VITALS — BP 162/62 | HR 79 | Temp 97.8°F | Resp 18 | Ht 64.5 in | Wt 150.7 lb

## 2016-04-01 VITALS — BP 147/75 | HR 78 | Temp 97.2°F | Resp 18

## 2016-04-01 DIAGNOSIS — D630 Anemia in neoplastic disease: Secondary | ICD-10-CM

## 2016-04-01 DIAGNOSIS — Z5111 Encounter for antineoplastic chemotherapy: Secondary | ICD-10-CM

## 2016-04-01 DIAGNOSIS — C531 Malignant neoplasm of exocervix: Secondary | ICD-10-CM

## 2016-04-01 DIAGNOSIS — Z9889 Other specified postprocedural states: Secondary | ICD-10-CM | POA: Diagnosis not present

## 2016-04-01 DIAGNOSIS — M8580 Other specified disorders of bone density and structure, unspecified site: Secondary | ICD-10-CM

## 2016-04-01 DIAGNOSIS — C539 Malignant neoplasm of cervix uteri, unspecified: Secondary | ICD-10-CM

## 2016-04-01 DIAGNOSIS — Z888 Allergy status to other drugs, medicaments and biological substances status: Secondary | ICD-10-CM | POA: Diagnosis not present

## 2016-04-01 DIAGNOSIS — C679 Malignant neoplasm of bladder, unspecified: Secondary | ICD-10-CM

## 2016-04-01 DIAGNOSIS — C672 Malignant neoplasm of lateral wall of bladder: Secondary | ICD-10-CM

## 2016-04-01 DIAGNOSIS — C7989 Secondary malignant neoplasm of other specified sites: Secondary | ICD-10-CM | POA: Diagnosis not present

## 2016-04-01 DIAGNOSIS — I10 Essential (primary) hypertension: Secondary | ICD-10-CM | POA: Diagnosis not present

## 2016-04-01 DIAGNOSIS — D509 Iron deficiency anemia, unspecified: Secondary | ICD-10-CM

## 2016-04-01 DIAGNOSIS — Z510 Encounter for antineoplastic radiation therapy: Secondary | ICD-10-CM | POA: Diagnosis not present

## 2016-04-01 DIAGNOSIS — D649 Anemia, unspecified: Secondary | ICD-10-CM

## 2016-04-01 DIAGNOSIS — Z96642 Presence of left artificial hip joint: Secondary | ICD-10-CM | POA: Diagnosis not present

## 2016-04-01 DIAGNOSIS — I878 Other specified disorders of veins: Secondary | ICD-10-CM

## 2016-04-01 DIAGNOSIS — Z808 Family history of malignant neoplasm of other organs or systems: Secondary | ICD-10-CM | POA: Diagnosis not present

## 2016-04-01 DIAGNOSIS — Z79899 Other long term (current) drug therapy: Secondary | ICD-10-CM | POA: Diagnosis not present

## 2016-04-01 LAB — CBC WITH DIFFERENTIAL/PLATELET
BASO%: 0.4 % (ref 0.0–2.0)
BASOS ABS: 0 10*3/uL (ref 0.0–0.1)
EOS ABS: 0.1 10*3/uL (ref 0.0–0.5)
EOS%: 3.2 % (ref 0.0–7.0)
HEMATOCRIT: 26.1 % — AB (ref 34.8–46.6)
HGB: 8.8 g/dL — ABNORMAL LOW (ref 11.6–15.9)
LYMPH#: 0.3 10*3/uL — AB (ref 0.9–3.3)
LYMPH%: 10.4 % — AB (ref 14.0–49.7)
MCH: 31.1 pg (ref 25.1–34.0)
MCHC: 33.7 g/dL (ref 31.5–36.0)
MCV: 92.2 fL (ref 79.5–101.0)
MONO#: 0.3 10*3/uL (ref 0.1–0.9)
MONO%: 11.8 % (ref 0.0–14.0)
NEUT#: 2.1 10*3/uL (ref 1.5–6.5)
NEUT%: 74.2 % (ref 38.4–76.8)
PLATELETS: 182 10*3/uL (ref 145–400)
RBC: 2.83 10*6/uL — AB (ref 3.70–5.45)
RDW: 18.6 % — ABNORMAL HIGH (ref 11.2–14.5)
WBC: 2.8 10*3/uL — ABNORMAL LOW (ref 3.9–10.3)

## 2016-04-01 LAB — COMPREHENSIVE METABOLIC PANEL
ALT: 12 U/L (ref 0–55)
ANION GAP: 10 meq/L (ref 3–11)
AST: 12 U/L (ref 5–34)
Albumin: 3.4 g/dL — ABNORMAL LOW (ref 3.5–5.0)
Alkaline Phosphatase: 70 U/L (ref 40–150)
BILIRUBIN TOTAL: 0.48 mg/dL (ref 0.20–1.20)
BUN: 13.1 mg/dL (ref 7.0–26.0)
CALCIUM: 8.8 mg/dL (ref 8.4–10.4)
CO2: 23 meq/L (ref 22–29)
Chloride: 103 mEq/L (ref 98–109)
Creatinine: 0.7 mg/dL (ref 0.6–1.1)
EGFR: 80 mL/min/{1.73_m2} — AB (ref >90)
Glucose: 98 mg/dl (ref 70–140)
Potassium: 4.4 mEq/L (ref 3.5–5.1)
Sodium: 136 mEq/L (ref 136–145)
Total Protein: 6.5 g/dL (ref 6.4–8.3)

## 2016-04-01 LAB — TECHNOLOGIST REVIEW

## 2016-04-01 LAB — MAGNESIUM: MAGNESIUM: 2.2 mg/dL (ref 1.5–2.5)

## 2016-04-01 MED ORDER — SODIUM CHLORIDE 0.9 % IV SOLN
Freq: Once | INTRAVENOUS | Status: AC
  Administered 2016-04-01: 11:00:00 via INTRAVENOUS

## 2016-04-01 MED ORDER — ONDANSETRON HCL 8 MG PO TABS: 8.0000 mg | ORAL_TABLET | Freq: Three times a day (TID) | ORAL | 0 refills | Status: AC | PRN

## 2016-04-01 MED ORDER — SODIUM CHLORIDE 0.9 % IV SOLN
40.0000 mg/m2 | Freq: Once | INTRAVENOUS | Status: AC
  Administered 2016-04-01: 72 mg via INTRAVENOUS
  Filled 2016-04-01: qty 72

## 2016-04-01 MED ORDER — ONDANSETRON HCL 40 MG/20ML IJ SOLN
Freq: Once | INTRAMUSCULAR | Status: AC
  Administered 2016-04-01: 13:00:00 via INTRAVENOUS
  Filled 2016-04-01: qty 4

## 2016-04-01 MED ORDER — SODIUM CHLORIDE 0.9 % IV SOLN
Freq: Once | INTRAVENOUS | Status: AC
  Administered 2016-04-01: 13:00:00 via INTRAVENOUS
  Filled 2016-04-01: qty 5

## 2016-04-01 MED ORDER — FERROUS FUMARATE 325 (106 FE) MG PO TABS: 1.0000 | ORAL_TABLET | Freq: Two times a day (BID) | ORAL | 1 refills | Status: AC

## 2016-04-01 MED ORDER — POTASSIUM CHLORIDE 2 MEQ/ML IV SOLN
Freq: Once | INTRAVENOUS | Status: AC
  Administered 2016-04-01: 11:00:00 via INTRAVENOUS
  Filled 2016-04-01: qty 10

## 2016-04-01 NOTE — Progress Notes (Signed)
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 She will have cycle 6 CDDP today; IMRT is planned thru Wed 04-10-16, to be followed with HDR.   Patient continues to tolerate treatment reasonably well. She has diarrhea related to radiation, ~ 6 loose stools in last 24 hrs, used total of 6 imodium. She denies abdominal pain, bloody stools, fever; perirectal area is a little irritated, is using baby wipes and sitz baths, could use desitin after radiation daily as long as cleaned off well prior to treatment. She has some slight queasiness without frank nausea, uses zofran ~ 1x daily with improvement. She is drinking ~ 2 Boost daily, trying to eat and to drink fluids. No other bleeding. No peripheral neuropathy. No noted decrease in hearing. Denies SOB at rest in exam room, did walk into office today. Note Dr Sondra Come preferred hemoglobin >8.8 when she needed PRBCs in late 01-2016, for optimal radiation effectiveness. Peripheral IV access is not easy, however patient does not want central line and present chemo planned to be last. If she needs additional chemo in future she will need central line.  Remainder of 10 point Review of Systems negative.    ONCOLOGIC HISTORY She was seen by PCP, reports urine culture negative, then by Dr Karsten Ro. CT AP at [facility] had minimal left pleural thickening and atelectasis, heart ULN without pericardial effusion, liver/pancreas/spleen/adrenals normal, no urinary tract calculi, no renal lesions, large irregular cervical mass, endometrium thickened, ovaries normal, bilateral obturator nodes 10 mm, left external iliac node 11 mm, other small nodes. Cystoscopy 01-23-16 found <0.5 cm tumor  posterior and lateral to left ureteral orifice, otherwise no abnormalities. Per Dr Karsten Ro, on visualization this appeared to be transitional cell neoplasm; biopsy is planned on 02-16-16. She was seen by Dr Gillian Scarce on 01-31-16, with exam remarkable for necrotic tumor replacing cervix and extending to upper third of viagina, extending to right parametrium but not to sidewalls, no cervical, supraclavicular or inguinal adenopathy. Biopsy done 01-31-16 ([id number]) poorly differentiated carcinoma favor squamous cell,IIIB due to right parametrial involvement. PET had hypermetabolic uptake at cervix and low uptake bilateral external iliac nodes, no other distant disease. Bladder biopsy 02-16-16 found noninvasive low grade papillary urothelial carcinoma. She began weekly sensitizing CDDP on 02-26-16, with radiation. She was transfused 2 units PRBCs on 02-29-16. Vaginal bleeding stopped by second week of treatment  Objective:  Vital signs in last 24 hours:  BP (!) 162/62 (BP Location: Left Arm, Patient Position: Sitting) Comment: informed nurse  Pulse 79   Temp 97.8 F (36.6 C) (Oral)   Resp 18   Ht 5' 4.5" (1.638 m)   Wt 150 lb 11.2 oz (68.4 kg)   SpO2 100%   BMI 25.47 kg/m  Weight stable. Does not appear in any acute discomfort. Alert, oriented and appropriate. Ambulatory without assistance.  Alopecia  HEENT:PERRL, sclerae not icteric. Oral mucosa moist without lesions, posterior pharynx clear, mucous membranes and conjunctivae pale (wearing lipstick and nail polish) Neck supple. No JVD.  Lymphatics:no cervical,supraclavicular, axillary or inguinal adenopathy Resp: clear to auscultation bilaterally and normal percussion bilaterally Cardio: regular rate and rhythm. No gallop. GI: soft, nontender, not distended, no mass or organomegaly. Normally active bowel sounds.  Musculoskeletal/ Extremities: without pitting edema, cords, tenderness Neuro:  no peripheral neuropathy. Otherwise nonfocal. PSYCH  appropriate mood and affect Skin without rash, ecchymosis, petechiae   Lab Results:  Results for orders placed or performed in visit on 04/01/16  TECHNOLOGIST REVIEW  Result Value Ref Range   Technologist Review Occ Metas and Myelocytes present   CBC with Differential  Result Value Ref Range   WBC 2.8 (L) 3.9 - 10.3 10e3/uL   NEUT# 2.1 1.5 - 6.5 10e3/uL   HGB 8.8 (L) 11.6 - 15.9 g/dL   HCT 26.1 (L) 34.8 - 46.6 %   Platelets 182 145 - 400 10e3/uL   MCV 92.2 79.5 - 101.0 fL   MCH 31.1 25.1 - 34.0 pg   MCHC 33.7 31.5 - 36.0 g/dL   RBC 2.83 (L) 3.70 - 5.45 10e6/uL   RDW 18.6 (H) 11.2 - 14.5 %   lymph# 0.3 (L) 0.9 - 3.3 10e3/uL   MONO# 0.3 0.1 - 0.9 10e3/uL   Eosinophils Absolute 0.1 0.0 - 0.5 10e3/uL   Basophils Absolute 0.0 0.0 - 0.1 10e3/uL   NEUT% 74.2 38.4 - 76.8 %   LYMPH% 10.4 (L) 14.0 - 49.7 %   MONO% 11.8 0.0 - 14.0 %   EOS% 3.2 0.0 - 7.0 %   BASO% 0.4 0.0 - 2.0 %  Comprehensive metabolic panel  Result Value Ref Range   Sodium 136 136 - 145 mEq/L   Potassium 4.4 3.5 - 5.1 mEq/L   Chloride 103 98 - 109 mEq/L   CO2 23 22 - 29 mEq/L   Glucose 98 70 - 140 mg/dl   BUN 13.1 7.0 - 26.0 mg/dL   Creatinine 0.7 0.6 - 1.1 mg/dL   Total Bilirubin 0.48 0.20 - 1.20 mg/dL   Alkaline Phosphatase 70 40 - 150 U/L   AST 12 5 - 34 U/L   ALT 12 0 - 55 U/L   Total Protein 6.5 6.4 - 8.3 g/dL   Albumin 3.4 (L) 3.5 - 5.0 g/dL   Calcium 8.8 8.4 - 10.4 mg/dL   Anion Gap 10 3 - 11 mEq/L   EGFR 80 (L) >90 ml/min/1.73 m2  Magnesium  Result Value Ref Range   Magnesium 2.2 1.5 - 2.5 mg/dl     Studies/Results:  No results found.  Medications: I have reviewed the patient's current medications. She continues oral iron. She knows that she can take up to 8 imodium / 24 hrs and should let radiation oncology or this office know if not sufficient, lomotil if so Refill iron and ondansetron  DISCUSSION She agrees to 1 unit PRBCs this week, as external beam RT planned thru Wed 04-10-16.   Consideration given to transfusion with CDDP IV hydration today, however patient required 6 sticks to establish IV for chemo, which infiltrated towards end of treatment, so did not attempt transfusion today; also did not get crossmatch drawn today. WIll set up 1 unit PRBC transfusion this week, tho if unable to get adequate access will not be able to administer those. Continue oral iron.   She understands that she will be seen back by medical oncology until recovered from chemo, and by gyn oncology after completion of all treatment. She is glad to see either Dr Gillian Scarce or one of her colleagues in gyn oncology  Assessment/Plan:  1.poorly differentiated squamous cell carcinoma of cervix: IIIB, PET with low uptake bilateral external iliac nodes. Radiation begun8-28-17 with sensitizing weekly CDDP, planned x 6 cycles thru 04-01-16. CBC CMET Mg weekly. I will follow coordinating with chemo and radiation.  2 Low grade noninvasive urothelial carcinoma, excisional bladderbiopsy by Dr Karsten Ro 02-16-16. May have beencause of presenting hematuria, vs vaginal bleeding. She is to see Dr Karsten Ro in follow up possibly in 6 months 3.iron deficiency anemia: vaginal bleeding has stopped. Transfused to improve oxygenation for radiation effect when hgb also 8.8 in 01-2016, and will transfuse 1 unit this week for hgb again 8.8.Oral iron as above.  History of anemia around hip surgery. 4.post left total hip replacement 12-2014 by Dr Eustace Quail femoral neck fracture in fall. Note healing nonpathologic left rib fractures noted on PET. 5.osteopenia by DEXA 2016. Discussed weight bearing exercise. See #4. 6.past minimal tobacco, DCd 1998 7.recent right cataract extraction, doing well 8.never mammograms, which we will address when possible 9.up to date on colonoscopy, no polyps, no further exams planned. 10. HTN followed by PCP 11. Needs flu vaccine after completion of chemo, will give with MD visit late Oct 12.  Multiple loose stools related to ongoing radiation. Needs good hydration and imodium  13. Chemo extravasation right hand 03-11-16 resolved without problems. Chemo extravasation halfway thru treatment today, after MD visit. Follow per protocol. 14. Very difficult peripheral IV access. Will need central line if chemotherapy required in future.   All questions answered, chemo orders confirmed, transfusion orders placed. Discussed with infusion RN after IV infiltration following visit today, chemo discontinued then. Time spent 25 min including >50% counseling and coordination of care. Route PCP, cc Dr Sondra Come  Evlyn Clines, MD   04/02/2016, 2:16 PM

## 2016-04-01 NOTE — Progress Notes (Signed)
Pt has had an issue with IV infiltration in the past, RN closely monitored her IV site, at 1400 heat pack applied, + bld return noted, iv infusing w/o issue. Recheck at 1425, RN noted swelling around IV site, infusion stopped, ice applied and [name],RNP was notified and assessed area. Pt will return tomorrow for evaluation. Pt verbalized understanding of signs & symptoms to look for.  Per dr Marko Plume, treatment was discontinued. IV access took 6 attempts this am, pt did not wish to be accessed again.

## 2016-04-01 NOTE — Patient Instructions (Signed)
[facility] Discharge Instructions for Patients Receiving Chemotherapy  Today you received the following chemotherapy agents: Cisplatin. To help prevent nausea and vomiting after your treatment, we encourage you to take your nausea medication as prescribed by your doctor. If you develop nausea and vomiting that is not controlled by your nausea medication, call the clinic.   BELOW ARE SYMPTOMS THAT SHOULD BE REPORTED IMMEDIATELY:  *FEVER GREATER THAN 100.5 F  *CHILLS WITH OR WITHOUT FEVER  NAUSEA AND VOMITING THAT IS NOT CONTROLLED WITH YOUR NAUSEA MEDICATION  *UNUSUAL SHORTNESS OF BREATH  *UNUSUAL BRUISING OR BLEEDING  TENDERNESS IN MOUTH AND THROAT WITH OR WITHOUT PRESENCE OF ULCERS  *URINARY PROBLEMS  *BOWEL PROBLEMS  UNUSUAL RASH Items with * indicate a potential emergency and should be followed up as soon as possible.  Feel free to call the clinic you have any questions or concerns. The clinic phone number is [phone].  Please show the [place] at check-in to the Emergency Department and triage nurse.

## 2016-04-02 ENCOUNTER — Encounter: Payer: Self-pay | Admitting: Nurse Practitioner

## 2016-04-02 ENCOUNTER — Ambulatory Visit
Admission: RE | Admit: 2016-04-02 | Discharge: 2016-04-02 | Disposition: A | Discharge status: Home / Self Care | Payer: Commercial Managed Care - HMO | Source: Ambulatory Visit | Attending: Radiation Oncology | Admitting: Radiation Oncology

## 2016-04-02 ENCOUNTER — Encounter: Payer: Self-pay | Admitting: Oncology

## 2016-04-02 ENCOUNTER — Inpatient Hospital Stay (HOSPITAL_COMMUNITY): Admission: RE | Admit: 2016-04-02 | Payer: Commercial Managed Care - HMO | Source: Ambulatory Visit

## 2016-04-02 ENCOUNTER — Encounter: Payer: Self-pay | Admitting: Radiation Oncology

## 2016-04-02 ENCOUNTER — Telehealth: Payer: Self-pay

## 2016-04-02 ENCOUNTER — Ambulatory Visit: Payer: Commercial Managed Care - HMO

## 2016-04-02 VITALS — BP 146/71 | HR 87 | Temp 97.6°F | Ht 64.5 in | Wt 150.0 lb

## 2016-04-02 DIAGNOSIS — Z808 Family history of malignant neoplasm of other organs or systems: Secondary | ICD-10-CM | POA: Diagnosis not present

## 2016-04-02 DIAGNOSIS — Z9889 Other specified postprocedural states: Secondary | ICD-10-CM | POA: Diagnosis not present

## 2016-04-02 DIAGNOSIS — I10 Essential (primary) hypertension: Secondary | ICD-10-CM | POA: Diagnosis not present

## 2016-04-02 DIAGNOSIS — C531 Malignant neoplasm of exocervix: Secondary | ICD-10-CM | POA: Diagnosis not present

## 2016-04-02 DIAGNOSIS — Z96642 Presence of left artificial hip joint: Secondary | ICD-10-CM | POA: Diagnosis not present

## 2016-04-02 DIAGNOSIS — C539 Malignant neoplasm of cervix uteri, unspecified: Secondary | ICD-10-CM | POA: Diagnosis not present

## 2016-04-02 DIAGNOSIS — Z79899 Other long term (current) drug therapy: Secondary | ICD-10-CM | POA: Diagnosis not present

## 2016-04-02 DIAGNOSIS — Z510 Encounter for antineoplastic radiation therapy: Secondary | ICD-10-CM | POA: Diagnosis not present

## 2016-04-02 DIAGNOSIS — Z888 Allergy status to other drugs, medicaments and biological substances status: Secondary | ICD-10-CM | POA: Diagnosis not present

## 2016-04-02 DIAGNOSIS — C672 Malignant neoplasm of lateral wall of bladder: Secondary | ICD-10-CM

## 2016-04-02 DIAGNOSIS — C7989 Secondary malignant neoplasm of other specified sites: Secondary | ICD-10-CM | POA: Diagnosis not present

## 2016-04-02 NOTE — Progress Notes (Signed)
  Radiation Oncology         [phone] ________________________________  Name: Sherri Mendez MRN: [id number]  Date: 04/02/2016  DOB: 1935-09-07  Weekly Radiation Therapy Management    ICD-9-CM ICD-10-CM   1. Malignant neoplasm of lateral wall of urinary bladder (HCC) 188.2 C67.2   2. Malignant neoplasm of exocervix (HCC) 180.1 C53.1      Current Dose: 46.8 Gy     Planned Dose:  54+ Gy  Narrative . . . . . . . . The patient presents for routine under treatment assessment.                                Sherri Mendez has completed 24 fractions to her pelvis.  She denies having pain or bladder issues.  She reports having diarrhea about once a day and takes about 2 Imodium per day.  She has not had any diarrhea today.  She reports having fatigue.  She denies having any vaginal/rectal bleeding or discharge.  She had chemotherapy yesterday.  She denies having any skin irritation.                                  Set-up films were reviewed.                                 The chart was checked. Physical Findings. . .  height is 5' 4.5" (1.638 m) and weight is 150 lb (68 kg). Her oral temperature is 97.6 F (36.4 C). Her blood pressure is 146/71 (abnormal) and her pulse is 87. Her oxygen saturation is 100%. . The lungs are clear. The heart has a regular rhythm and rate your the abdomen is soft and nontender with normal bowel sounds. Impression . . . . . . . The patient is tolerating radiation. Plan . . . . . . . . . . . . Continue treatment as planned. Discussed upcoming brachytherapy procedure in detail with the patient and her daughter.  ________________________________   Blair Promise, PhD, MD  This document serves as a record of services personally performed by Gery Pray, MD. It was created on his behalf by Truddie Hidden, a trained medical scribe. The creation of this record is based on the scribe's personal observations and the provider's statements to them. This document has been  checked and approved by the attending provider.

## 2016-04-02 NOTE — Progress Notes (Signed)
Sherri Mendez has completed 24 fractions to her pelvis.  She denies having pain or bladder issues.  She reports having diarrhea about once a day and takes about 2 Imodium per day.  She has not had any diarrhea today.  She reports having fatigue.  She denies having any vaginal/rectal bleeding or discharge.  She had chemotherapy yesterday.  She denies having any skin irritation.  BP (!) 146/71 (BP Location: Left Arm, Patient Position: Sitting)   Pulse 87   Temp 97.6 F (36.4 C) (Oral)   Ht 5' 4.5" (1.638 m)   Wt 150 lb (68 kg)   SpO2 100%   BMI 25.35 kg/m    Wt Readings from Last 3 Encounters:  04/02/16 150 lb (68 kg)  04/01/16 150 lb 11.2 oz (68.4 kg)  03/26/16 155 lb 9.6 oz (70.6 kg)

## 2016-04-02 NOTE — Telephone Encounter (Signed)
Sherri Mendez had 7 iv sticks on 10/2 between lab and Infusion. She did receive chemo tx.  She had appt for blood on 10/3 b/c her hgb was 8.8. Dr Marko Plume cancelled the blood appt and will have CBC redone Thursday or Friday. In basket sent to scheduler.  She is also on daily xrt. Pt was asked to wait in lobby for results.

## 2016-04-02 NOTE — Assessment & Plan Note (Signed)
Patient presented to the [facility] today to receive cycle 6 of her cisplatin chemotherapy.  This was scheduled to be her last cycle of chemotherapy.  However, patient once again experienced an extravasation of chemotherapy to the right wrist; and decision was made per Dr. Marko Plume to hold any further chemotherapy today.  See further notes for details.  Patient also continues with daily radiation treatments as scheduled.  Dr. Marko Plume will schedule patient for any further follow-up.

## 2016-04-02 NOTE — Progress Notes (Signed)
SYMPTOM MANAGEMENT CLINIC    Chief Complaint: Extravasation  HPI:  Sherri Mendez 80 y.o. female diagnosed with cervical cancer.  Currently undergoing cisplatin chemotherapy regimen and radiation treatments.    No history exists.    Review of Systems  Skin:       Right lateral wrist extravasation  All other systems reviewed and are negative.   Past Medical History:  Diagnosis Date  . Anemia   . Bladder tumor   . Cervical cancer [facility]) dx 01-31-2016--  oncologist-  dr livesay/  dr Sondra Come   FIGO Stage IIB, Invasive poorly differeniated sqaumous cell carcinoma, involiving bilateral external iliac nodes   . Gross hematuria   . History of rib fracture    left side  . Hypertension   . Lower urinary tract symptoms (LUTS)   . Osteopenia   . Wears glasses     Past Surgical History:  Procedure Laterality Date  . CATARACT EXTRACTION W/ INTRAOCULAR LENS IMPLANT Right 10/2015  . COLONOSCOPY  2016  . CYSTOSCOPY WITH BIOPSY N/A 02/16/2016   Procedure: CYSTOSCOPY WITH  BLADDER BIOPSY;  Surgeon: [name], MD;  Location: [facility];  Service: Urology;  Laterality: N/A;  . ORIF RIGHT ANKLE FX  2006   retined hardware  . TOTAL HIP ARTHROPLASTY Left 12/28/2014   Procedure: TOTAL HIP ARTHROPLASTY ANTERIOR APPROACH;  Surgeon: Leandrew Koyanagi, MD;  Location: [place];  Service: Orthopedics;  Laterality: Left;  femur fracture    has Fracture of femoral neck, left ([place]); Leukocytosis; Elevated blood pressure; Malignant neoplasm of exocervix ([place]); Postmenopausal vaginal bleeding; Other iron deficiency anemias; Extravasation, infusion or chemotherapeutic agent; and Malignant neoplasm of lateral wall of urinary bladder ([place]) on her problem list.    is allergic to sulfa antibiotics.    Medication List       Accurate as of 04/01/16 11:59 PM. Always use your most recent med list.          acetaminophen 325 MG tablet Commonly known as:  TYLENOL Take 650 mg by mouth every 6  (six) hours as needed for mild pain, moderate pain or headache.   amLODipine 5 MG tablet Commonly known as:  NORVASC Take 5 mg by mouth at bedtime.   ferrous fumarate 325 (106 Fe) MG Tabs tablet Commonly known as:  HEMOCYTE - 106 mg FE Take 1 tablet (106 mg of iron total) by mouth 2 (two) times daily.   loperamide 2 MG capsule Commonly known as:  IMODIUM Take 2-4 mg by mouth as needed for diarrhea or loose stools.   ondansetron 8 MG tablet Commonly known as:  ZOFRAN Take 1 tablet (8 mg total) by mouth every 8 (eight) hours as needed for nausea.   phenazopyridine 97 MG tablet Commonly known as:  PYRIDIUM Take 97 mg by mouth 3 (three) times daily.   prochlorperazine 10 MG tablet Commonly known as:  COMPAZINE Take 1 tablet (10 mg total) by mouth every 6 (six) hours as needed for nausea or vomiting.        PHYSICAL EXAMINATION  Oncology Vitals 04/01/2016 04/01/2016  Height - 164 cm  Weight - 68.357 kg  Weight (lbs) - 150 lbs 11 oz  BMI (kg/m2) - 25.47 kg/m2  Temp 97.2 97.8  Pulse 78 79  Resp 18 18  SpO2 100 100  BSA (m2) - 1.76 m2   BP Readings from Last 2 Encounters:  04/01/16 (!) 147/75  04/01/16 (!) 162/62    Physical Exam  Constitutional:  She is well-developed, well-nourished, and in no distress.  HENT:  Head: Normocephalic and atraumatic.  Eyes: Conjunctivae and EOM are normal. Pupils are equal, round, and reactive to light.  Neck: Normal range of motion.  Pulmonary/Chest: Effort normal. No respiratory distress.  Musculoskeletal: Normal range of motion. She exhibits edema.  Neurological: She is alert.  Skin: Skin is warm and dry.  Mild edema to the right lateral radial peripheral insertion IV site.  Psychiatric: Affect normal.  Nursing note and vitals reviewed.   LABORATORY DATA:. Appointment on 04/01/2016  Component Date Value Ref Range Status  . WBC 04/01/2016 2.8* 3.9 - 10.3 10e3/uL Final  . NEUT# 04/01/2016 2.1  1.5 - 6.5 10e3/uL Final  . HGB  04/01/2016 8.8* 11.6 - 15.9 g/dL Final  . HCT 04/01/2016 26.1* 34.8 - 46.6 % Final  . Platelets 04/01/2016 182  145 - 400 10e3/uL Final  . MCV 04/01/2016 92.2  79.5 - 101.0 fL Final  . MCH 04/01/2016 31.1  25.1 - 34.0 pg Final  . MCHC 04/01/2016 33.7  31.5 - 36.0 g/dL Final  . RBC 04/01/2016 2.83* 3.70 - 5.45 10e6/uL Final  . RDW 04/01/2016 18.6* 11.2 - 14.5 % Final  . lymph# 04/01/2016 0.3* 0.9 - 3.3 10e3/uL Final  . MONO# 04/01/2016 0.3  0.1 - 0.9 10e3/uL Final  . Eosinophils Absolute 04/01/2016 0.1  0.0 - 0.5 10e3/uL Final  . Basophils Absolute 04/01/2016 0.0  0.0 - 0.1 10e3/uL Final  . NEUT% 04/01/2016 74.2  38.4 - 76.8 % Final  . LYMPH% 04/01/2016 10.4* 14.0 - 49.7 % Final  . MONO% 04/01/2016 11.8  0.0 - 14.0 % Final  . EOS% 04/01/2016 3.2  0.0 - 7.0 % Final  . BASO% 04/01/2016 0.4  0.0 - 2.0 % Final  . Sodium 04/01/2016 136  136 - 145 mEq/L Final  . Potassium 04/01/2016 4.4  3.5 - 5.1 mEq/L Final  . Chloride 04/01/2016 103  98 - 109 mEq/L Final  . CO2 04/01/2016 23  22 - 29 mEq/L Final  . Glucose 04/01/2016 98  70 - 140 mg/dl Final  . BUN 04/01/2016 13.1  7.0 - 26.0 mg/dL Final  . Creatinine 04/01/2016 0.7  0.6 - 1.1 mg/dL Final  . Total Bilirubin 04/01/2016 0.48  0.20 - 1.20 mg/dL Final  . Alkaline Phosphatase 04/01/2016 70  40 - 150 U/L Final  . AST 04/01/2016 12  5 - 34 U/L Final  . ALT 04/01/2016 12  0 - 55 U/L Final  . Total Protein 04/01/2016 6.5  6.4 - 8.3 g/dL Final  . Albumin 04/01/2016 3.4* 3.5 - 5.0 g/dL Final  . Calcium 04/01/2016 8.8  8.4 - 10.4 mg/dL Final  . Anion Gap 04/01/2016 10  3 - 11 mEq/L Final  . EGFR 04/01/2016 80* >90 ml/min/1.73 m2 Final  . Magnesium 04/01/2016 2.2  1.5 - 2.5 mg/dl Final  . Technologist Review 04/01/2016 Occ Metas and Myelocytes present   Final    RADIOGRAPHIC STUDIES: No results found.  ASSESSMENT/PLAN:    Malignant neoplasm of exocervix [facility]) Patient presented to the [facility] today to receive cycle 6 of her cisplatin  chemotherapy.  This was scheduled to be her last cycle of chemotherapy.  However, patient once again experienced an extravasation of chemotherapy to the right wrist; and decision was made per Dr. Marko Plume to hold any further chemotherapy today.  See further notes for details.  Patient also continues with daily radiation treatments as scheduled.  Dr. Marko Plume will schedule patient  for any further follow-up.  Extravasation, infusion or chemotherapeutic agent Patient presented to the [facility] today to receive cycle 6 of her cisplatin chemotherapy.  This was scheduled to be her last cycle of chemotherapy.  However, patient once again experienced an extravasation of chemotherapy to the right wrist.  Exam revealed a peripheral IV to the right lateral wrist site; with some mild edema noted.  There was no erythema, warmth, tenderness, or red streaks to the site.  Reviewed extravasation protocol with the patient once again; and patient will return tomorrow for recheck of the site.  She was also encouraged to elevate her arm above the level of for heart when ever she is at rest; and also try cool compresses to site as well.  The parachute was advised to call/return or go directly to the emergency department for any worsening symptoms whatsoever.   Patient stated understanding of all instructions; and was in agreement with this plan of care. The patient knows to call the clinic with any problems, questions or concerns.   Total time spent with patient was 15 percent; with greater than 75 percent of that time spent in face to face counseling regarding patient's symptoms,  and coordination of care and follow up.  Disclaimer:This dictation was prepared with Dragon/digital dictation along with [employer]. Any transcriptional errors that result from this process are unintentional.  Drue Second, NP 04/02/2016

## 2016-04-02 NOTE — Progress Notes (Signed)
Pt returned for extravasation check.  No redness, erythema, or c/o pain to right hand.  No evidence of extravasation.  Pt instructed to call/return prior to next visit if she has any new complaints.  Pt verbalized understanding.

## 2016-04-02 NOTE — Assessment & Plan Note (Signed)
Patient presented to the [facility] today to receive cycle 6 of her cisplatin chemotherapy.  This was scheduled to be her last cycle of chemotherapy.  However, patient once again experienced an extravasation of chemotherapy to the right wrist.  Exam revealed a peripheral IV to the right lateral wrist site; with some mild edema noted.  There was no erythema, warmth, tenderness, or red streaks to the site.  Reviewed extravasation protocol with the patient once again; and patient will return tomorrow for recheck of the site.  She was also encouraged to elevate her arm above the level of for heart when ever she is at rest; and also try cool compresses to site as well.  The parachute was advised to call/return or go directly to the emergency department for any worsening symptoms whatsoever.

## 2016-04-03 ENCOUNTER — Ambulatory Visit: Payer: Commercial Managed Care - HMO

## 2016-04-03 DIAGNOSIS — Z79899 Other long term (current) drug therapy: Secondary | ICD-10-CM | POA: Diagnosis not present

## 2016-04-03 DIAGNOSIS — Z9889 Other specified postprocedural states: Secondary | ICD-10-CM | POA: Diagnosis not present

## 2016-04-03 DIAGNOSIS — Z808 Family history of malignant neoplasm of other organs or systems: Secondary | ICD-10-CM | POA: Diagnosis not present

## 2016-04-03 DIAGNOSIS — I10 Essential (primary) hypertension: Secondary | ICD-10-CM | POA: Diagnosis not present

## 2016-04-03 DIAGNOSIS — Z510 Encounter for antineoplastic radiation therapy: Secondary | ICD-10-CM | POA: Diagnosis not present

## 2016-04-03 DIAGNOSIS — Z96642 Presence of left artificial hip joint: Secondary | ICD-10-CM | POA: Diagnosis not present

## 2016-04-03 DIAGNOSIS — Z888 Allergy status to other drugs, medicaments and biological substances status: Secondary | ICD-10-CM | POA: Diagnosis not present

## 2016-04-03 DIAGNOSIS — C7989 Secondary malignant neoplasm of other specified sites: Secondary | ICD-10-CM | POA: Diagnosis not present

## 2016-04-03 DIAGNOSIS — C539 Malignant neoplasm of cervix uteri, unspecified: Secondary | ICD-10-CM | POA: Diagnosis not present

## 2016-04-03 DIAGNOSIS — C531 Malignant neoplasm of exocervix: Secondary | ICD-10-CM | POA: Diagnosis not present

## 2016-04-04 ENCOUNTER — Ambulatory Visit: Payer: Commercial Managed Care - HMO

## 2016-04-04 ENCOUNTER — Telehealth: Payer: Self-pay

## 2016-04-04 ENCOUNTER — Other Ambulatory Visit (HOSPITAL_BASED_OUTPATIENT_CLINIC_OR_DEPARTMENT_OTHER): Payer: Commercial Managed Care - HMO

## 2016-04-04 ENCOUNTER — Other Ambulatory Visit: Payer: Self-pay | Admitting: Oncology

## 2016-04-04 ENCOUNTER — Ambulatory Visit
Admission: RE | Admit: 2016-04-04 | Discharge: 2016-04-04 | Disposition: A | Discharge status: Home / Self Care | Payer: Commercial Managed Care - HMO | Source: Ambulatory Visit | Attending: Radiation Oncology | Admitting: Radiation Oncology

## 2016-04-04 DIAGNOSIS — C531 Malignant neoplasm of exocervix: Secondary | ICD-10-CM | POA: Diagnosis not present

## 2016-04-04 DIAGNOSIS — C679 Malignant neoplasm of bladder, unspecified: Secondary | ICD-10-CM

## 2016-04-04 DIAGNOSIS — C7989 Secondary malignant neoplasm of other specified sites: Secondary | ICD-10-CM | POA: Diagnosis not present

## 2016-04-04 DIAGNOSIS — Z96642 Presence of left artificial hip joint: Secondary | ICD-10-CM | POA: Diagnosis not present

## 2016-04-04 DIAGNOSIS — C539 Malignant neoplasm of cervix uteri, unspecified: Secondary | ICD-10-CM | POA: Diagnosis not present

## 2016-04-04 DIAGNOSIS — Z9889 Other specified postprocedural states: Secondary | ICD-10-CM | POA: Diagnosis not present

## 2016-04-04 DIAGNOSIS — Z79899 Other long term (current) drug therapy: Secondary | ICD-10-CM | POA: Diagnosis not present

## 2016-04-04 DIAGNOSIS — T451X5A Adverse effect of antineoplastic and immunosuppressive drugs, initial encounter: Principal | ICD-10-CM

## 2016-04-04 DIAGNOSIS — I10 Essential (primary) hypertension: Secondary | ICD-10-CM | POA: Diagnosis not present

## 2016-04-04 DIAGNOSIS — Z510 Encounter for antineoplastic radiation therapy: Secondary | ICD-10-CM | POA: Diagnosis not present

## 2016-04-04 DIAGNOSIS — D6481 Anemia due to antineoplastic chemotherapy: Secondary | ICD-10-CM

## 2016-04-04 DIAGNOSIS — C672 Malignant neoplasm of lateral wall of bladder: Secondary | ICD-10-CM | POA: Diagnosis not present

## 2016-04-04 DIAGNOSIS — Z888 Allergy status to other drugs, medicaments and biological substances status: Secondary | ICD-10-CM | POA: Diagnosis not present

## 2016-04-04 DIAGNOSIS — Z808 Family history of malignant neoplasm of other organs or systems: Secondary | ICD-10-CM | POA: Diagnosis not present

## 2016-04-04 DIAGNOSIS — D630 Anemia in neoplastic disease: Secondary | ICD-10-CM | POA: Diagnosis not present

## 2016-04-04 LAB — CBC WITH DIFFERENTIAL/PLATELET
BASO%: 0.4 % (ref 0.0–2.0)
Basophils Absolute: 0 10*3/uL (ref 0.0–0.1)
EOS ABS: 0 10*3/uL (ref 0.0–0.5)
EOS%: 1.2 % (ref 0.0–7.0)
HCT: 24.6 % — ABNORMAL LOW (ref 34.8–46.6)
HEMOGLOBIN: 8.2 g/dL — AB (ref 11.6–15.9)
LYMPH%: 10.1 % — ABNORMAL LOW (ref 14.0–49.7)
MCH: 31.3 pg (ref 25.1–34.0)
MCHC: 33.3 g/dL (ref 31.5–36.0)
MCV: 93.9 fL (ref 79.5–101.0)
MONO#: 0.3 10*3/uL (ref 0.1–0.9)
MONO%: 11.3 % (ref 0.0–14.0)
NEUT%: 77 % — ABNORMAL HIGH (ref 38.4–76.8)
NEUTROS ABS: 1.9 10*3/uL (ref 1.5–6.5)
Platelets: 183 10*3/uL (ref 145–400)
RBC: 2.62 10*6/uL — ABNORMAL LOW (ref 3.70–5.45)
RDW: 20.7 % — AB (ref 11.2–14.5)
WBC: 2.5 10*3/uL — AB (ref 3.9–10.3)
lymph#: 0.3 10*3/uL — ABNORMAL LOW (ref 0.9–3.3)

## 2016-04-04 LAB — PREPARE RBC (CROSSMATCH)

## 2016-04-04 NOTE — Progress Notes (Signed)
  Radiation Oncology         [phone] ________________________________  Name: Sherri Mendez MRN: [id number]  Date: 04/04/2016  DOB: 03-Dec-1935  Simulation Verification Note - sidewall boost setup    ICD-9-CM ICD-10-CM   1. Malignant neoplasm of exocervix ([place]) 180.1 C53.1     Status: outpatient  NARRATIVE: The patient was brought to the treatment unit and placed in the planned treatment position. The clinical setup was verified. Then port films were obtained and uploaded to the radiation oncology medical record software.  The treatment beams were carefully compared against the planned radiation fields. The position location and shape of the radiation fields was reviewed. They targeted volume of tissue appears to be appropriately covered by the radiation beams. Organs at risk appear to be excluded as planned.  Based on my personal review, I approved the simulation verification. The patient's treatment will proceed as planned.  -----------------------------------  Blair Promise, PhD, MD

## 2016-04-04 NOTE — Telephone Encounter (Signed)
Told Ms Franze that her Hgb was lower today at 8.2 from 8.8 on 04-01-16.  Dr. Marko Plume would like to give her 1 unit of PRBC's tomorrow. Pt agreeable to transfuion tomorrow.   Told Ms Stansell that the appointment is at 0800 tomorrow. Register about 0750.  She verbalized understanding. Suggested that Ms Health ask the infusion nurse to check with Radiation tomorrow and see if her treatment can be moved up to when the transfusion wil be completed tomorrow.  Pt verblaized understanding.  Dr. Marko Plume to enter transfuse orders.

## 2016-04-05 ENCOUNTER — Ambulatory Visit: Payer: Commercial Managed Care - HMO

## 2016-04-05 ENCOUNTER — Ambulatory Visit
Admission: RE | Admit: 2016-04-05 | Discharge: 2016-04-05 | Disposition: A | Discharge status: Home / Self Care | Payer: Commercial Managed Care - HMO | Source: Ambulatory Visit | Attending: Radiation Oncology | Admitting: Radiation Oncology

## 2016-04-05 ENCOUNTER — Other Ambulatory Visit: Payer: Self-pay | Admitting: Radiation Oncology

## 2016-04-05 ENCOUNTER — Ambulatory Visit (HOSPITAL_BASED_OUTPATIENT_CLINIC_OR_DEPARTMENT_OTHER): Payer: Commercial Managed Care - HMO

## 2016-04-05 DIAGNOSIS — Z888 Allergy status to other drugs, medicaments and biological substances status: Secondary | ICD-10-CM | POA: Diagnosis not present

## 2016-04-05 DIAGNOSIS — T451X5A Adverse effect of antineoplastic and immunosuppressive drugs, initial encounter: Principal | ICD-10-CM

## 2016-04-05 DIAGNOSIS — Z79899 Other long term (current) drug therapy: Secondary | ICD-10-CM | POA: Diagnosis not present

## 2016-04-05 DIAGNOSIS — C539 Malignant neoplasm of cervix uteri, unspecified: Secondary | ICD-10-CM

## 2016-04-05 DIAGNOSIS — D6481 Anemia due to antineoplastic chemotherapy: Secondary | ICD-10-CM

## 2016-04-05 DIAGNOSIS — Z9889 Other specified postprocedural states: Secondary | ICD-10-CM | POA: Diagnosis not present

## 2016-04-05 DIAGNOSIS — D509 Iron deficiency anemia, unspecified: Secondary | ICD-10-CM | POA: Diagnosis not present

## 2016-04-05 DIAGNOSIS — C531 Malignant neoplasm of exocervix: Secondary | ICD-10-CM | POA: Diagnosis not present

## 2016-04-05 DIAGNOSIS — D630 Anemia in neoplastic disease: Secondary | ICD-10-CM | POA: Diagnosis not present

## 2016-04-05 DIAGNOSIS — I10 Essential (primary) hypertension: Secondary | ICD-10-CM | POA: Diagnosis not present

## 2016-04-05 DIAGNOSIS — C7989 Secondary malignant neoplasm of other specified sites: Secondary | ICD-10-CM | POA: Diagnosis not present

## 2016-04-05 DIAGNOSIS — Z808 Family history of malignant neoplasm of other organs or systems: Secondary | ICD-10-CM | POA: Diagnosis not present

## 2016-04-05 DIAGNOSIS — Z510 Encounter for antineoplastic radiation therapy: Secondary | ICD-10-CM | POA: Diagnosis not present

## 2016-04-05 DIAGNOSIS — C672 Malignant neoplasm of lateral wall of bladder: Secondary | ICD-10-CM | POA: Diagnosis not present

## 2016-04-05 DIAGNOSIS — Z96642 Presence of left artificial hip joint: Secondary | ICD-10-CM | POA: Diagnosis not present

## 2016-04-05 MED ORDER — ACETAMINOPHEN 325 MG PO TABS
ORAL_TABLET | ORAL | Status: AC
  Filled 2016-04-05: qty 2

## 2016-04-05 MED ORDER — SODIUM CHLORIDE 0.9 % IV SOLN
250.0000 mL | Freq: Once | INTRAVENOUS | Status: AC
  Administered 2016-04-05: 250 mL via INTRAVENOUS

## 2016-04-05 MED ORDER — ACETAMINOPHEN 325 MG PO TABS
650.0000 mg | ORAL_TABLET | Freq: Once | ORAL | Status: AC
  Administered 2016-04-05: 650 mg via ORAL

## 2016-04-05 NOTE — Patient Instructions (Signed)
Blood Transfusion, Care After °Refer to this sheet in the next few weeks. These instructions provide you with information about caring for yourself after your procedure. Your health care provider may also give you more specific instructions. Your treatment has been planned according to current medical practices, but problems sometimes occur. Call your health care provider if you have any problems or questions after your procedure. °WHAT TO EXPECT AFTER THE PROCEDURE °After your procedure, it is common to have: °· Bruising and soreness at the IV site. °· Chills or fever. °· Headache. °HOME CARE INSTRUCTIONS °· Take medicines only as directed by your health care provider. Ask your health care provider if you can take an over-the-counter pain reliever in case you have a fever or headache a day or two after your transfusion. °· Return to your normal activities as directed by your health care provider. °SEEK MEDICAL CARE IF:  °· You develop redness or irritation at your IV site. °· You have persistent fever, chills, or headache. °· Your urine is darker than normal. °· Your urine turns pink, red, or brown.   °· The white part of your eye turns yellow (jaundice).   °· You feel weak after doing your normal activities.   °SEEK IMMEDIATE MEDICAL CARE IF:  °· You have trouble breathing. °· You have fever and chills along with: °¨ Anxiety. °¨ Chest or back pain. °¨ Flushed skin. °¨ Clammy skin. °¨ A rapid heartbeat. °¨ Nausea. °  °This information is not intended to replace advice given to you by your health care provider. Make sure you discuss any questions you have with your health care provider. °  °Document Released: 07/08/2014 Document Reviewed: 07/08/2014 °Elsevier Interactive Patient Education ©2016 Elsevier Inc. ° °

## 2016-04-07 NOTE — H&P (Signed)
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 female who is diagnosed with stage IIB squamous cell carcinoma cervix earlier this year. She is currently receiving external beam radiation therapy along with radiosensitizing chemotherapy. Patient will be taken to the operating room for exam under anesthesia and placement of a tandem ring applicator in preparation for high-dose rate radiation therapy with iridium 192 as the high-dose-rate source.   PAST MEDICAL HISTORY:  has a past medical history of Anemia; Bladder tumor; Cervical cancer [facility]) (dx 01-31-2016--  oncologist-  dr livesay/  dr Sondra Come); Gross hematuria; History of rib fracture; Hypertension; Lower urinary tract symptoms (LUTS); Osteopenia; and Wears glasses.    PAST SURGICAL HISTORY: Past Surgical History:  Procedure Laterality Date  . CATARACT EXTRACTION W/ INTRAOCULAR LENS IMPLANT Right 10/2015  . COLONOSCOPY  2016  . CYSTOSCOPY WITH BIOPSY N/A 02/16/2016   Procedure: CYSTOSCOPY WITH  BLADDER BIOPSY;  Surgeon: [name], MD;  Location: [facility];  Service: Urology;  Laterality: N/A;  . ORIF RIGHT ANKLE FX  2006   retined hardware  . TOTAL HIP ARTHROPLASTY Left 12/28/2014   Procedure: TOTAL HIP ARTHROPLASTY ANTERIOR APPROACH;  Surgeon: Leandrew Koyanagi, MD;  Location: [place];  Service: Orthopedics;  Laterality: Left;  femur fracture    FAMILY HISTORY: family history includes Cancer in her maternal aunt; Melanoma in her brother.  SOCIAL HISTORY:  reports that she quit smoking about 19 years ago. She quit after 3.00 years of use. She has never used smokeless tobacco. She reports that she drinks  about 2.4 oz of alcohol per week . She reports that she does not use drugs.  ALLERGIES: Sulfa antibiotics  MEDICATIONS:  No current facility-administered medications for this encounter.    Current Outpatient Prescriptions  Medication Sig Dispense Refill  . acetaminophen (TYLENOL) 325 MG tablet Take 650 mg by mouth every 6 (six) hours as needed for mild pain, moderate pain or headache.     Marland Kitchen amLODipine (NORVASC) 5 MG tablet Take 5 mg by mouth at bedtime.    . ferrous fumarate (HEMOCYTE - 106 MG FE) 325 (106 Fe) MG TABS tablet Take 1 tablet (106 mg of iron total) by mouth 2 (two) times daily. 60 each 1  . loperamide (IMODIUM) 2 MG capsule Take 2-4 mg by mouth as needed for diarrhea or loose stools.    . ondansetron (ZOFRAN) 8 MG tablet Take 1 tablet (8 mg total) by mouth every 8 (eight) hours as needed for nausea. 30 tablet 0  . phenazopyridine (PYRIDIUM) 97 MG tablet Take 97 mg by mouth 3 (three) times daily.    . prochlorperazine (COMPAZINE) 10 MG tablet Take 1 tablet (10 mg total) by mouth every 6 (six) hours as needed for nausea or vomiting. 20 tablet 0    REVIEW OF SYSTEMS:  A 15 point review of systems is documented in the electronic medical record. This was obtained by the nursing staff. However, I reviewed this with the patient to discuss relevant findings and make appropriate changes.     PHYSICAL EXAM: height is 5' 4.5" (1.638 m) and weight is 160 lb 11.2 oz (72.9 kg). Her oral temperature is 98.1 F (36.7 C). Her  blood pressure is 154/70 (abnormal) and her pulse is 100. Her oxygen saturation is 98%. General: Alert and oriented, in no acute distress HEENT: Head is normocephalic. Extraocular movements are intact. Oropharynx is clear. Neck: Neck is supple, no palpable cervical or supraclavicular lymphadenopathy. Heart: Regular in rate and rhythm with no murmurs, rubs, or gallops. Chest: Clear to auscultation bilaterally, with no rhonchi, wheezes, or rales. Abdomen: Soft, nontender,  nondistended, with no rigidity or guarding. Extremities: No cyanosis or edema. Skin: No concerning lesions. Musculoskeletal: symmetric strength and muscle tone throughout. Neurologic: Cranial nerves II through XII are grossly intact. No obvious focalities. Speech is fluent. Coordination is intact. Psychiatric: Judgment and insight are intact. Affect is appropriate. Pelvic examination: deferred until operative procedure   ECOG = 1  LABORATORY DATA:  Lab Results  Component Value Date   WBC 2.5 (L) 04/04/2016   HGB 8.2 (L) 04/04/2016   HCT 24.6 (L) 04/04/2016   MCV 93.9 04/04/2016   PLT 183 04/04/2016   NEUTROABS 1.9 04/04/2016   Lab Results  Component Value Date   NA 136 04/01/2016   K 4.4 04/01/2016   CL 104 03/18/2016   CO2 23 04/01/2016   GLUCOSE 98 04/01/2016   CREATININE 0.7 04/01/2016   CALCIUM 8.8 04/01/2016      RADIOGRAPHY: Dg Chest 2 View  Result Date: 03/18/2016 CLINICAL DATA:  Fever today following chemotherapy. EXAM: CHEST  2 VIEW COMPARISON:  Chest CT 02/06/2016 FINDINGS: The cardiac silhouette, mediastinal and hilar contours are within normal limits. Mild tortuosity of the thoracic aorta. Streaky subsegmental atelectasis in the left lower lobe. No infiltrates or effusions. Stable apical scarring changes. The bony thorax is intact. IMPRESSION: No acute cardiopulmonary findings. Minimal streaky left basilar atelectasis. Electronically Signed   By: Marijo Sanes M.D.   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 Patient is now ready to proceed with her first brachytherapy procedure. She will receive a total of 5 high-dose rate treatments.  PLAN: The patient will present to the operative suite on October 12 for her first brachytherapy procedure.     ------------------------------------------------  Blair Promise, PhD, MD

## 2016-04-08 ENCOUNTER — Ambulatory Visit: Payer: Commercial Managed Care - HMO

## 2016-04-08 ENCOUNTER — Encounter (HOSPITAL_BASED_OUTPATIENT_CLINIC_OR_DEPARTMENT_OTHER): Payer: Self-pay | Admitting: *Deleted

## 2016-04-08 ENCOUNTER — Ambulatory Visit
Admission: RE | Admit: 2016-04-08 | Discharge: 2016-04-08 | Disposition: A | Discharge status: Home / Self Care | Payer: Commercial Managed Care - HMO | Source: Ambulatory Visit | Attending: Radiation Oncology | Admitting: Radiation Oncology

## 2016-04-08 DIAGNOSIS — Z888 Allergy status to other drugs, medicaments and biological substances status: Secondary | ICD-10-CM | POA: Diagnosis not present

## 2016-04-08 DIAGNOSIS — C7989 Secondary malignant neoplasm of other specified sites: Secondary | ICD-10-CM | POA: Diagnosis not present

## 2016-04-08 DIAGNOSIS — I10 Essential (primary) hypertension: Secondary | ICD-10-CM | POA: Diagnosis not present

## 2016-04-08 DIAGNOSIS — Z9889 Other specified postprocedural states: Secondary | ICD-10-CM | POA: Diagnosis not present

## 2016-04-08 DIAGNOSIS — Z79899 Other long term (current) drug therapy: Secondary | ICD-10-CM | POA: Diagnosis not present

## 2016-04-08 DIAGNOSIS — Z96642 Presence of left artificial hip joint: Secondary | ICD-10-CM | POA: Diagnosis not present

## 2016-04-08 DIAGNOSIS — C539 Malignant neoplasm of cervix uteri, unspecified: Secondary | ICD-10-CM | POA: Diagnosis not present

## 2016-04-08 DIAGNOSIS — Z510 Encounter for antineoplastic radiation therapy: Secondary | ICD-10-CM | POA: Diagnosis not present

## 2016-04-08 DIAGNOSIS — Z808 Family history of malignant neoplasm of other organs or systems: Secondary | ICD-10-CM | POA: Diagnosis not present

## 2016-04-08 DIAGNOSIS — C531 Malignant neoplasm of exocervix: Secondary | ICD-10-CM | POA: Diagnosis not present

## 2016-04-08 LAB — TYPE AND SCREEN
ABO/RH(D): A NEG
ANTIBODY SCREEN: NEGATIVE
Unit division: 0

## 2016-04-08 NOTE — Progress Notes (Signed)
Patient returned today for last scheduled extravasation check.  Her Right arm/hand/wrist feels fine. "No problems".  No discoloration or edema noted. Pt aware if any symptoms develop she should still call us.

## 2016-04-09 ENCOUNTER — Encounter (HOSPITAL_BASED_OUTPATIENT_CLINIC_OR_DEPARTMENT_OTHER): Payer: Self-pay | Admitting: *Deleted

## 2016-04-09 ENCOUNTER — Encounter: Payer: Self-pay | Admitting: Radiation Oncology

## 2016-04-09 ENCOUNTER — Ambulatory Visit: Payer: Commercial Managed Care - HMO

## 2016-04-09 ENCOUNTER — Ambulatory Visit
Admission: RE | Admit: 2016-04-09 | Discharge: 2016-04-09 | Disposition: A | Discharge status: Home / Self Care | Payer: Commercial Managed Care - HMO | Source: Ambulatory Visit | Attending: Radiation Oncology | Admitting: Radiation Oncology

## 2016-04-09 VITALS — BP 135/64 | HR 73 | Temp 98.1°F | Ht 64.5 in | Wt 146.6 lb

## 2016-04-09 DIAGNOSIS — Z510 Encounter for antineoplastic radiation therapy: Secondary | ICD-10-CM | POA: Diagnosis not present

## 2016-04-09 DIAGNOSIS — C531 Malignant neoplasm of exocervix: Secondary | ICD-10-CM

## 2016-04-09 DIAGNOSIS — I10 Essential (primary) hypertension: Secondary | ICD-10-CM | POA: Diagnosis not present

## 2016-04-09 DIAGNOSIS — Z96642 Presence of left artificial hip joint: Secondary | ICD-10-CM | POA: Diagnosis not present

## 2016-04-09 DIAGNOSIS — Z79899 Other long term (current) drug therapy: Secondary | ICD-10-CM | POA: Diagnosis not present

## 2016-04-09 DIAGNOSIS — Z9889 Other specified postprocedural states: Secondary | ICD-10-CM | POA: Diagnosis not present

## 2016-04-09 DIAGNOSIS — Z888 Allergy status to other drugs, medicaments and biological substances status: Secondary | ICD-10-CM | POA: Diagnosis not present

## 2016-04-09 DIAGNOSIS — C7989 Secondary malignant neoplasm of other specified sites: Secondary | ICD-10-CM | POA: Diagnosis not present

## 2016-04-09 DIAGNOSIS — Z808 Family history of malignant neoplasm of other organs or systems: Secondary | ICD-10-CM | POA: Diagnosis not present

## 2016-04-09 DIAGNOSIS — C539 Malignant neoplasm of cervix uteri, unspecified: Secondary | ICD-10-CM | POA: Diagnosis not present

## 2016-04-09 NOTE — Progress Notes (Signed)
  Radiation Oncology         [phone] ________________________________  Name: Sherri Mendez MRN: [id number]  Date: 04/09/2016  DOB: 05-Dec-1935  Weekly Radiation Therapy Management    ICD-9-CM ICD-10-CM   1. Malignant neoplasm of exocervix ([facility]) 180.1 C53.1         Current Dose: 52.2 Gy     Planned Dose:  54+ Gy  Narrative . . . . . . . . The patient presents for routine under treatment assessment.                              Sherri Mendez is here for her 29th fraction of radiation to her Pelvis. She denies pain. She reports decreased energy. She reports diarrhea in the morning after breakfast. She will take 2 imodium after the first stool and if it happens again, she will take another imodium. She denies any urinary symptoms. She denies skin irritation at the radiation site. The patient will be completing radiotherapy this week, and then will continue with her internal radiation.                                   Set-up films were reviewed.                                 The chart was checked. Physical Findings. . .  height is 5' 4.5" (1.638 m) and weight is 146 lb 9.6 oz (66.5 kg). Her temperature is 98.1 F (36.7 C). Her blood pressure is 135/64 and her pulse is 73. Her oxygen saturation is 99%. . The lungs are clear. The heart has a regular rhythm and rate your the abdomen is soft and nontender with normal bowel sounds. Impression . . . . . . . The patient is tolerating radiation. Plan . . . . . . . . . . . . Continue treatment as planned. I discussed the procedure for the internal radiation with the patient and her daughter. She will undergo preoperative blood work tomorrow prior to her last external beam radiation treatment.   first brachytherapy is scheduled in 2 days  ________________________________   Blair Promise, PhD, MD  This document serves as a record of services personally performed by Gery Pray, MD. It was created on his behalf by Truddie Hidden, a  trained medical scribe. The creation of this record is based on the scribe's personal observations and the provider's statements to them. This document has been checked and approved by the attending provider.

## 2016-04-09 NOTE — Progress Notes (Signed)
NPO AFTER MN.  ARRIVE AT 1798.  PT GETTING LAB WORK DONE AT [facility] TODAY OR TOMORROW (CBC, BMET).  PT STATED SINCE SHE IS THERE FOR RADIATION TREATMENT , SHE WILL REQUEST IT.  CURRENT EKG IN CHART AND EPIC.

## 2016-04-09 NOTE — Progress Notes (Addendum)
Sherri Mendez is here for her 29th fraction of radiation to her Pelvis. She denies pain. She reports decreased energy. She reports diarrhea in the morning after breakfast. She will take 2 immodiums after the first stool and if it happens again, she will take another immodium. She denies any urinary symptoms. She denies skin irritation at the radiation site.   BP 135/64   Pulse 73   Temp 98.1 F (36.7 C)   Ht 5' 4.5" (1.638 m)   Wt 146 lb 9.6 oz (66.5 kg)   SpO2 99% Comment: room air  BMI 24.78 kg/m    Wt Readings from Last 3 Encounters:  04/09/16 146 lb 9.6 oz (66.5 kg)  04/02/16 150 lb (68 kg)  04/01/16 150 lb 11.2 oz (68.4 kg)

## 2016-04-10 ENCOUNTER — Encounter: Payer: Self-pay | Admitting: Radiation Oncology

## 2016-04-10 ENCOUNTER — Ambulatory Visit
Admission: RE | Admit: 2016-04-10 | Discharge: 2016-04-10 | Disposition: A | Discharge status: Home / Self Care | Payer: Commercial Managed Care - HMO | Source: Ambulatory Visit | Attending: Radiation Oncology | Admitting: Radiation Oncology

## 2016-04-10 ENCOUNTER — Ambulatory Visit: Payer: Commercial Managed Care - HMO

## 2016-04-10 DIAGNOSIS — C7989 Secondary malignant neoplasm of other specified sites: Secondary | ICD-10-CM | POA: Diagnosis not present

## 2016-04-10 DIAGNOSIS — Z808 Family history of malignant neoplasm of other organs or systems: Secondary | ICD-10-CM | POA: Diagnosis not present

## 2016-04-10 DIAGNOSIS — Z9889 Other specified postprocedural states: Secondary | ICD-10-CM | POA: Diagnosis not present

## 2016-04-10 DIAGNOSIS — Z79899 Other long term (current) drug therapy: Secondary | ICD-10-CM | POA: Diagnosis not present

## 2016-04-10 DIAGNOSIS — Z510 Encounter for antineoplastic radiation therapy: Secondary | ICD-10-CM | POA: Diagnosis not present

## 2016-04-10 DIAGNOSIS — C539 Malignant neoplasm of cervix uteri, unspecified: Secondary | ICD-10-CM | POA: Diagnosis not present

## 2016-04-10 DIAGNOSIS — I10 Essential (primary) hypertension: Secondary | ICD-10-CM | POA: Diagnosis not present

## 2016-04-10 DIAGNOSIS — C531 Malignant neoplasm of exocervix: Secondary | ICD-10-CM | POA: Diagnosis not present

## 2016-04-10 DIAGNOSIS — Z888 Allergy status to other drugs, medicaments and biological substances status: Secondary | ICD-10-CM | POA: Diagnosis not present

## 2016-04-10 DIAGNOSIS — Z96642 Presence of left artificial hip joint: Secondary | ICD-10-CM | POA: Diagnosis not present

## 2016-04-10 NOTE — Anesthesia Preprocedure Evaluation (Addendum)
Anesthesia Evaluation  Patient identified by MRN, date of birth, ID band Patient awake    Reviewed: Allergy & Precautions, NPO status , Patient's Chart, lab work & pertinent test results  Airway Mallampati: II  TM Distance: >3 FB Neck ROM: Full    Dental no notable dental hx.    Pulmonary former smoker,    Pulmonary exam normal breath sounds clear to auscultation       Cardiovascular hypertension, Pt. on medications Normal cardiovascular exam Rhythm:Regular Rate:Normal     Neuro/Psych negative neurological ROS  negative psych ROS   GI/Hepatic negative GI ROS, Neg liver ROS,   Endo/Other  negative endocrine ROS  Renal/GU negative Renal ROS  negative genitourinary   Musculoskeletal negative musculoskeletal ROS (+)   Abdominal   Peds negative pediatric ROS (+)  Hematology  (+) anemia , hgb 8.8   Anesthesia Other Findings   Reproductive/Obstetrics negative OB ROS                           Anesthesia Physical Anesthesia Plan  ASA: II  Anesthesia Plan: General   Post-op Pain Management:    Induction: Intravenous  Airway Management Planned: LMA  Additional Equipment:   Intra-op Plan:   Post-operative Plan: Extubation in OR  Informed Consent: I have reviewed the patients History and Physical, chart, labs and discussed the procedure including the risks, benefits and alternatives for the proposed anesthesia with the patient or authorized representative who has indicated his/her understanding and acceptance.   Dental advisory given  Plan Discussed with: CRNA  Anesthesia Plan Comments:         Anesthesia Quick Evaluation

## 2016-04-11 ENCOUNTER — Ambulatory Visit
Admission: RE | Admit: 2016-04-11 | Discharge: 2016-04-11 | Disposition: A | Discharge status: Home / Self Care | Payer: Commercial Managed Care - HMO | Source: Ambulatory Visit | Attending: Radiation Oncology | Admitting: Radiation Oncology

## 2016-04-11 ENCOUNTER — Encounter (HOSPITAL_BASED_OUTPATIENT_CLINIC_OR_DEPARTMENT_OTHER): Payer: Self-pay | Admitting: *Deleted

## 2016-04-11 ENCOUNTER — Ambulatory Visit (HOSPITAL_BASED_OUTPATIENT_CLINIC_OR_DEPARTMENT_OTHER): Payer: Commercial Managed Care - HMO | Admitting: Anesthesiology

## 2016-04-11 ENCOUNTER — Encounter (HOSPITAL_BASED_OUTPATIENT_CLINIC_OR_DEPARTMENT_OTHER)
Admission: RE | Disposition: A | Discharge status: Home / Self Care | Payer: Self-pay | Source: Ambulatory Visit | Attending: Radiation Oncology

## 2016-04-11 ENCOUNTER — Ambulatory Visit (HOSPITAL_BASED_OUTPATIENT_CLINIC_OR_DEPARTMENT_OTHER)
Admission: RE | Admit: 2016-04-11 | Discharge: 2016-04-11 | Disposition: A | Discharge status: Home / Self Care | Payer: Commercial Managed Care - HMO | Source: Ambulatory Visit | Attending: Radiation Oncology | Admitting: Radiation Oncology

## 2016-04-11 ENCOUNTER — Ambulatory Visit (HOSPITAL_COMMUNITY)
Admission: RE | Admit: 2016-04-11 | Discharge: 2016-04-11 | Disposition: A | Discharge status: Home / Self Care | Payer: Commercial Managed Care - HMO | Source: Ambulatory Visit | Attending: Radiation Oncology | Admitting: Radiation Oncology

## 2016-04-11 VITALS — BP 155/63 | HR 88

## 2016-04-11 DIAGNOSIS — Z840 Family history of diseases of the skin and subcutaneous tissue: Secondary | ICD-10-CM | POA: Insufficient documentation

## 2016-04-11 DIAGNOSIS — R310 Gross hematuria: Secondary | ICD-10-CM | POA: Insufficient documentation

## 2016-04-11 DIAGNOSIS — D649 Anemia, unspecified: Secondary | ICD-10-CM | POA: Diagnosis not present

## 2016-04-11 DIAGNOSIS — Z96642 Presence of left artificial hip joint: Secondary | ICD-10-CM | POA: Insufficient documentation

## 2016-04-11 DIAGNOSIS — Z87891 Personal history of nicotine dependence: Secondary | ICD-10-CM | POA: Insufficient documentation

## 2016-04-11 DIAGNOSIS — C7989 Secondary malignant neoplasm of other specified sites: Secondary | ICD-10-CM | POA: Diagnosis not present

## 2016-04-11 DIAGNOSIS — Z79899 Other long term (current) drug therapy: Secondary | ICD-10-CM | POA: Insufficient documentation

## 2016-04-11 DIAGNOSIS — C530 Malignant neoplasm of endocervix: Secondary | ICD-10-CM

## 2016-04-11 DIAGNOSIS — Z9841 Cataract extraction status, right eye: Secondary | ICD-10-CM | POA: Diagnosis not present

## 2016-04-11 DIAGNOSIS — C539 Malignant neoplasm of cervix uteri, unspecified: Secondary | ICD-10-CM | POA: Diagnosis not present

## 2016-04-11 DIAGNOSIS — Z809 Family history of malignant neoplasm, unspecified: Secondary | ICD-10-CM | POA: Insufficient documentation

## 2016-04-11 DIAGNOSIS — I10 Essential (primary) hypertension: Secondary | ICD-10-CM | POA: Diagnosis not present

## 2016-04-11 DIAGNOSIS — C531 Malignant neoplasm of exocervix: Secondary | ICD-10-CM | POA: Diagnosis not present

## 2016-04-11 DIAGNOSIS — C512 Malignant neoplasm of clitoris: Secondary | ICD-10-CM | POA: Diagnosis not present

## 2016-04-11 DIAGNOSIS — Z888 Allergy status to other drugs, medicaments and biological substances status: Secondary | ICD-10-CM | POA: Diagnosis not present

## 2016-04-11 DIAGNOSIS — M8580 Other specified disorders of bone density and structure, unspecified site: Secondary | ICD-10-CM | POA: Diagnosis not present

## 2016-04-11 DIAGNOSIS — Z510 Encounter for antineoplastic radiation therapy: Secondary | ICD-10-CM | POA: Diagnosis not present

## 2016-04-11 DIAGNOSIS — Z808 Family history of malignant neoplasm of other organs or systems: Secondary | ICD-10-CM | POA: Diagnosis not present

## 2016-04-11 DIAGNOSIS — Z9889 Other specified postprocedural states: Secondary | ICD-10-CM | POA: Diagnosis not present

## 2016-04-11 HISTORY — PX: TANDEM RING INSERTION: SHX6199

## 2016-04-11 HISTORY — DX: Iron deficiency anemia, unspecified: D50.9

## 2016-04-11 HISTORY — DX: Personal history of malignant neoplasm of bladder: Z85.51

## 2016-04-11 LAB — CBC
HCT: 25.4 % — ABNORMAL LOW (ref 36.0–46.0)
HEMOGLOBIN: 8.8 g/dL — AB (ref 12.0–15.0)
MCH: 31.8 pg (ref 26.0–34.0)
MCHC: 34.6 g/dL (ref 30.0–36.0)
MCV: 91.7 fL (ref 78.0–100.0)
Platelets: 135 10*3/uL — ABNORMAL LOW (ref 150–400)
RBC: 2.77 MIL/uL — AB (ref 3.87–5.11)
RDW: 20.5 % — ABNORMAL HIGH (ref 11.5–15.5)
WBC: 2 10*3/uL — ABNORMAL LOW (ref 4.0–10.5)

## 2016-04-11 LAB — BASIC METABOLIC PANEL
Anion gap: 6 (ref 5–15)
BUN: 15 mg/dL (ref 6–20)
CHLORIDE: 104 mmol/L (ref 101–111)
CO2: 27 mmol/L (ref 22–32)
Calcium: 8.8 mg/dL — ABNORMAL LOW (ref 8.9–10.3)
Creatinine, Ser: 0.79 mg/dL (ref 0.44–1.00)
GFR calc Af Amer: >60 mL/min (ref >60)
GFR calc non Af Amer: >60 mL/min (ref >60)
GLUCOSE: 100 mg/dL — AB (ref 65–99)
POTASSIUM: 3.6 mmol/L (ref 3.5–5.1)
SODIUM: 137 mmol/L (ref 135–145)

## 2016-04-11 SURGERY — INSERTION, UTERINE TANDEM AND RING OR CYLINDER, FOR BRACHYTHERAPY
Anesthesia: General

## 2016-04-11 MED ORDER — FENTANYL CITRATE (PF) 100 MCG/2ML IJ SOLN
INTRAMUSCULAR | Status: AC
  Filled 2016-04-11: qty 2

## 2016-04-11 MED ORDER — DEXAMETHASONE SODIUM PHOSPHATE 4 MG/ML IJ SOLN
INTRAMUSCULAR | Status: DC | PRN
  Administered 2016-04-11: 10 mg via INTRAVENOUS

## 2016-04-11 MED ORDER — DEXAMETHASONE SODIUM PHOSPHATE 10 MG/ML IJ SOLN
INTRAMUSCULAR | Status: AC
  Filled 2016-04-11: qty 1

## 2016-04-11 MED ORDER — ACETAMINOPHEN 500 MG PO TABS
ORAL_TABLET | ORAL | Status: AC
  Filled 2016-04-11: qty 2

## 2016-04-11 MED ORDER — SILVER NITRATE-POT NITRATE 75-25 % EX MISC
CUTANEOUS | Status: DC | PRN
  Administered 2016-04-11: 1

## 2016-04-11 MED ORDER — LIDOCAINE 2% (20 MG/ML) 5 ML SYRINGE
INTRAMUSCULAR | Status: AC
  Filled 2016-04-11: qty 5

## 2016-04-11 MED ORDER — WATER FOR IRRIGATION, STERILE IR SOLN
Status: DC | PRN
  Administered 2016-04-11: 300 mL

## 2016-04-11 MED ORDER — ARTIFICIAL TEARS OP OINT
TOPICAL_OINTMENT | OPHTHALMIC | Status: AC
  Filled 2016-04-11: qty 3.5

## 2016-04-11 MED ORDER — FENTANYL CITRATE (PF) 100 MCG/2ML IJ SOLN
25.0000 ug | INTRAMUSCULAR | Status: DC | PRN
  Administered 2016-04-11: 50 ug via INTRAVENOUS
  Filled 2016-04-11: qty 1

## 2016-04-11 MED ORDER — FENTANYL CITRATE (PF) 100 MCG/2ML IJ SOLN
INTRAMUSCULAR | Status: DC | PRN
  Administered 2016-04-11 (×3): 25 ug via INTRAVENOUS
  Administered 2016-04-11 (×2): 50 ug via INTRAVENOUS
  Administered 2016-04-11: 25 ug via INTRAVENOUS

## 2016-04-11 MED ORDER — LIDOCAINE 2% (20 MG/ML) 5 ML SYRINGE
INTRAMUSCULAR | Status: DC | PRN
  Administered 2016-04-11: 70 mg via INTRAVENOUS

## 2016-04-11 MED ORDER — MIDAZOLAM HCL 2 MG/2ML IJ SOLN
INTRAMUSCULAR | Status: AC
  Filled 2016-04-11: qty 2

## 2016-04-11 MED ORDER — EPHEDRINE SULFATE-NACL 50-0.9 MG/10ML-% IV SOSY
PREFILLED_SYRINGE | INTRAVENOUS | Status: DC | PRN
  Administered 2016-04-11: 10 mg via INTRAVENOUS
  Administered 2016-04-11: 20 mg via INTRAVENOUS
  Administered 2016-04-11 (×2): 10 mg via INTRAVENOUS

## 2016-04-11 MED ORDER — MIDAZOLAM HCL 5 MG/5ML IJ SOLN
INTRAMUSCULAR | Status: DC | PRN
  Administered 2016-04-11: 1 mg via INTRAVENOUS

## 2016-04-11 MED ORDER — ONDANSETRON HCL 4 MG/2ML IJ SOLN
INTRAMUSCULAR | Status: AC
  Filled 2016-04-11: qty 2

## 2016-04-11 MED ORDER — ACETAMINOPHEN 500 MG PO TABS
1000.0000 mg | ORAL_TABLET | Freq: Once | ORAL | Status: AC
  Administered 2016-04-11: 1000 mg via ORAL
  Filled 2016-04-11: qty 2

## 2016-04-11 MED ORDER — ONDANSETRON HCL 4 MG/2ML IJ SOLN
INTRAMUSCULAR | Status: DC | PRN
  Administered 2016-04-11: 4 mg via INTRAVENOUS

## 2016-04-11 MED ORDER — KETOROLAC TROMETHAMINE 30 MG/ML IJ SOLN
INTRAMUSCULAR | Status: DC | PRN
  Administered 2016-04-11: 30 mg via INTRAVENOUS

## 2016-04-11 MED ORDER — PROPOFOL 10 MG/ML IV BOLUS
INTRAVENOUS | Status: AC
  Filled 2016-04-11: qty 40

## 2016-04-11 MED ORDER — HYDROMORPHONE HCL 1 MG/ML IJ SOLN
0.5000 mg | Freq: Once | INTRAMUSCULAR | Status: AC
  Administered 2016-04-11: 0.5 mg via INTRAVENOUS
  Filled 2016-04-11: qty 1

## 2016-04-11 MED ORDER — LACTATED RINGERS IV SOLN
INTRAVENOUS | Status: DC
  Administered 2016-04-11 (×2): via INTRAVENOUS
  Filled 2016-04-11: qty 1000

## 2016-04-11 MED ORDER — ESTRADIOL 0.1 MG/GM VA CREA
TOPICAL_CREAM | VAGINAL | Status: DC | PRN
  Administered 2016-04-11: 1 via VAGINAL

## 2016-04-11 MED ORDER — EPHEDRINE 5 MG/ML INJ
INTRAVENOUS | Status: AC
  Filled 2016-04-11: qty 10

## 2016-04-11 MED ORDER — KETOROLAC TROMETHAMINE 30 MG/ML IJ SOLN
INTRAMUSCULAR | Status: AC
  Filled 2016-04-11: qty 1

## 2016-04-11 MED ORDER — OXYCODONE-ACETAMINOPHEN 5-325 MG PO TABS: 1.0000 | ORAL_TABLET | ORAL | 0 refills | Status: AC | PRN

## 2016-04-11 MED ORDER — PROPOFOL 10 MG/ML IV BOLUS
INTRAVENOUS | Status: DC | PRN
  Administered 2016-04-11: 120 mg via INTRAVENOUS

## 2016-04-11 SURGICAL SUPPLY — 36 items
BAG URINE DRAINAGE (UROLOGICAL SUPPLIES) ×3 IMPLANT
BNDG CONFORM 2 STRL LF (GAUZE/BANDAGES/DRESSINGS) IMPLANT
CATH FOLEY 2WAY SLVR  5CC 16FR (CATHETERS) ×2
CATH FOLEY 2WAY SLVR 5CC 16FR (CATHETERS) ×1 IMPLANT
COVER BACK TABLE 60X90IN (DRAPES) ×3 IMPLANT
DRAPE LG THREE QUARTER DISP (DRAPES) ×3 IMPLANT
DRAPE UNDERBUTTOCKS STRL (DRAPE) ×3 IMPLANT
GLOVE BIO SURGEON STRL SZ7.5 (GLOVE) ×6 IMPLANT
GOWN STRL REUS W/ TWL LRG LVL3 (GOWN DISPOSABLE) ×2 IMPLANT
GOWN STRL REUS W/TWL LRG LVL3 (GOWN DISPOSABLE) ×4
HOLDER FOLEY CATH W/STRAP (MISCELLANEOUS) ×3 IMPLANT
KIT ROOM TURNOVER WOR (KITS) ×3 IMPLANT
LEGGING LITHOTOMY PAIR STRL (DRAPES) ×3 IMPLANT
MANIFOLD NEPTUNE II (INSTRUMENTS) IMPLANT
NEEDLE SPNL 22GX3.5 QUINCKE BK (NEEDLE) IMPLANT
PACK BASIN DAY SURGERY FS (CUSTOM PROCEDURE TRAY) ×3 IMPLANT
PACKING VAGINAL (PACKING) IMPLANT
PAD ABD 8X10 STRL (GAUZE/BANDAGES/DRESSINGS) ×3 IMPLANT
PAD OB MATERNITY 4.3X12.25 (PERSONAL CARE ITEMS) IMPLANT
PAD PREP 24X48 CUFFED NSTRL (MISCELLANEOUS) ×3 IMPLANT
PLUG CATH AND CAP STER (CATHETERS) IMPLANT
SET IRRIG Y TYPE TUR BLADDER L (SET/KITS/TRAYS/PACK) ×3 IMPLANT
SUT PROLENE 0 SH 30 (SUTURE) ×6 IMPLANT
SUT SILK 2 0 30  PSL (SUTURE)
SUT SILK 2 0 30 PSL (SUTURE) IMPLANT
SUT VIC AB 0 CT1 36 (SUTURE) ×3 IMPLANT
SUT VICRYL 0 UR6 27IN ABS (SUTURE) ×6 IMPLANT
SYR BULB IRRIGATION 50ML (SYRINGE) IMPLANT
SYR CONTROL 10ML LL (SYRINGE) IMPLANT
SYRINGE 10CC LL (SYRINGE) ×3 IMPLANT
TOWEL OR 17X24 6PK STRL BLUE (TOWEL DISPOSABLE) ×6 IMPLANT
TRAY DSU PREP LF (CUSTOM PROCEDURE TRAY) ×3 IMPLANT
TUBE CONNECTING 12'X1/4 (SUCTIONS)
TUBE CONNECTING 12X1/4 (SUCTIONS) IMPLANT
WATER STERILE IRR 3000ML UROMA (IV SOLUTION) ×3 IMPLANT
WATER STERILE IRR 500ML POUR (IV SOLUTION) ×3 IMPLANT

## 2016-04-11 NOTE — Progress Notes (Signed)
Removed foley catheter intact.  Also removed left wrist IV intact.  Pressure and dressing applied.

## 2016-04-11 NOTE — Progress Notes (Signed)
Patient back from CT SIM.  She denies having pain.  She has a foley catheter draining light yellow urine.  She has an IV with LR infusing to gravity to her left wrist.  She also has a saline lock in her right ac.  Started LR at 125 ml/hr to her right ac.  Saline locked the left wrist IV.  SCD boots in place and pump on.  Call light in reach and daughter present in the room.

## 2016-04-11 NOTE — Progress Notes (Signed)
Premedicated patient for tandem removal with 0.5 mg dilaudid IV per Dr. Sondra Come.

## 2016-04-11 NOTE — Brief Op Note (Signed)
04/11/2016  9:21 AM  PATIENT:  Sherri Mendez  80 y.o. female  PRE-OPERATIVE DIAGNOSIS:  Stage II-B Cervical Cancer  POST-OPERATIVE DIAGNOSIS:  same  PROCEDURE:  Procedure(s): TANDEM RING INSERTION (N/A)  SURGEON:  Surgeon(s) and Role:    * Gery Pray, MD - Primary    * Everitt Amber, MD - Assisting  PHYSICIAN ASSISTANT:   ASSISTANTS: none   ANESTHESIA:   general  EBL:  Total I/O In: 1000 [I.V.:1000] Out: 510 [Urine:500; Blood:10]  BLOOD ADMINISTERED:none  DRAINS: Urinary Catheter (Foley) and Urinary Catheter (Suprapubic)   LOCAL MEDICATIONS USED:  NONE  SPECIMEN:  Source of Specimen:  right clitoral biopsy x2 (clitoral hood), punch biopsy  DISPOSITION OF SPECIMEN:  PATHOLOGY  COUNTS:  YES  TOURNIQUET:  * No tourniquets in log *  DICTATION: Please see initial operative report dictated by Dr. Denman George earlier today. After the patient had a 4 cm cervical sleeve sewn into the uterus by Dr. Denman George I then placed a 40 mm, 45 tandem within the cervical sleeve. Patient then had a cervical ring device  placed with a small shielding cap. This was affixed to the tandem. The patient then had a rectal paddle placed posteriorly along the vaginal vault. Estrace cream was placed within the vaginal vault to facilitate comfort and removal of the device later in the day.  PLAN OF CARE: Transferred to radiation oncology for planning and treatment  PATIENT DISPOSITION:  PACU - hemodynamically stable.   Delay start of Pharmacological VTE agent (>24hrs) due to surgical blood loss or risk of bleeding: not applicable

## 2016-04-11 NOTE — Transfer of Care (Signed)
  Last Vitals:  Vitals:   04/11/16 0558  BP: (!) 174/56  Pulse: 76  Resp: 16  Temp: 37 C    Last Pain:  Vitals:   04/11/16 0558  TempSrc: Oral      Patients Stated Pain Goal: 5 (04/11/16 0630)  Immediate Anesthesia Transfer of Care Note  Patient: Sherri Mendez  Procedure(s) Performed: Procedure(s) (LRB): TANDEM RING INSERTION (N/A)  Patient Location: PACU  Anesthesia Type: General  Level of Consciousness: awake, alert  and oriented  Airway & Oxygen Therapy: Patient Spontanous Breathing and Patient connected to nasal cannula oxygen  Post-op Assessment: Report given to PACU RN and Post -op Vital signs reviewed and stable  Post vital signs: Reviewed and stable  Complications: No apparent anesthesia complications

## 2016-04-11 NOTE — Op Note (Signed)
PATIENT: Sherri Mendez DATE: 04/11/16   Preop Diagnosis: cervical cancer stage IIB s/p external beam radiation  Postoperative Diagnosis: same  Surgery: placement of brachytherapy sleeve under US guidance  Surgeons:  Donaciano Eva, MD Assistant: Gery Pray, MD  Anesthesia: General with LMA  Estimated blood loss: 47m  IVF:  200 ml   Urine output: not recorded due to retrograde filled bladder.  Complications: None   Pathology: right clitoris biopsy  Operative findings: very firm clitoral tissues fixed to underlying pubic bone. Moderate regression of cervical tumor - cervix flush with anterior vagina and retracted anteriorally. Visual tumor mass within the endocervical canal on UKorea   Procedure: The patient was identified in the preoperative holding area. Informed consent was signed on the chart. Patient was seen history was reviewed and exam was performed.   The patient was then taken to the operating room and placed in the supine position with SCD hose on. General anesthesia was then induced without difficulty. She was then placed in the dorsolithotomy position. The perineum was prepped with Betadine. The vagina was prepped with Betadine. The patient was then draped after the prep was dried. An indwelling catheterization to retrograde fill the bladder was performed under sterile conditions.  Timeout was performed the patient, procedure, antibiotic, allergy, and length of procedure.   Ultrasound was applied to the abdomen to visualize the uterus in sagittal plane.  A uterine sound was inserted through the cervix. Due to distortion by endocervical mass, a false passage was created into the parametrial tissues. With repeated efforts and slight retroversion of the instruments, the endocervical canal was traversed with the sound under direct UKoreavisualization, and then successively dilated.The probes could not reach the fundus due to obstruction by the endocervical mass.    0-vicryl was sutured to the cervix and then fixed to the smitt sleeve (4cm) which was inserted under direct UKoreaguidance into the cervix and endocervical canal, but it did not reach beyond the endocervical mass.   UKoreaconfirmed intracervical placement of the sleeve.  A punch biopsy x 2 was taken from the right clitoral hood.  All instrument, suture, laparotomy, Ray-Tec, and needle counts were correct x2. The patient tolerated the procedure well and was continued with Dr KSondra Come This is EEveritt Amberdictating an operative note on Sherri Mendez.

## 2016-04-11 NOTE — Anesthesia Procedure Notes (Signed)
Procedure Name: LMA Insertion Date/Time: 04/11/2016 7:22 AM Performed by: Franne Grip Pre-anesthesia Checklist: Patient identified, Emergency Drugs available, Suction available and Patient being monitored Patient Re-evaluated:Patient Re-evaluated prior to inductionOxygen Delivery Method: Circle system utilized Preoxygenation: Pre-oxygenation with 100% oxygen Intubation Type: IV induction Ventilation: Mask ventilation without difficulty LMA: LMA inserted LMA Size: 4.0 Number of attempts: 1 Airway Equipment and Method: Bite block Placement Confirmation: positive ETCO2 Tube secured with: Tape Dental Injury: Teeth and Oropharynx as per pre-operative assessment

## 2016-04-11 NOTE — Anesthesia Postprocedure Evaluation (Signed)
Anesthesia Post Note  Patient: Sherri Mendez  Procedure(s) Performed: Procedure(s) (LRB): TANDEM RING INSERTION (N/A)  Patient location during evaluation: PACU Anesthesia Type: General Level of consciousness: awake and alert Pain management: pain level controlled Vital Signs Assessment: post-procedure vital signs reviewed and stable Respiratory status: spontaneous breathing, nonlabored ventilation, respiratory function stable and patient connected to nasal cannula oxygen Cardiovascular status: blood pressure returned to baseline and stable Postop Assessment: no signs of nausea or vomiting Anesthetic complications: no    Last Vitals:  Vitals:   04/11/16 0928 04/11/16 0930  BP:  140/66  Pulse: 84 83  Resp: (!) 24 12  Temp:      Last Pain:  Vitals:   04/11/16 0928  TempSrc:   PainSc: 3                  Chantil Bari J

## 2016-04-11 NOTE — Progress Notes (Signed)
Removed right wrist IV intact.  Gave patient discharge instructions and paperwork.  Transported patient via wheelchair accompanied by her daughter to the lobby.

## 2016-04-11 NOTE — Interval H&P Note (Signed)
History and Physical Interval Note:  04/11/2016 9:18 AM  Assessed pt at 7:05 am Sherri Mendez  has presented today for surgery, with the diagnosis of cancer exocervix  The various methods of treatment have been discussed with the patient and family. After consideration of risks, benefits and other options for treatment, the patient has consented to  Procedure(s): TANDEM RING INSERTION (N/A) as a surgical intervention .  The patient's history has been reviewed, patient examined, no change in status, stable for surgery.  I have reviewed the patient's chart and labs.  Questions were answered to the patient's satisfaction.     Gery Pray

## 2016-04-11 NOTE — Progress Notes (Signed)
IMMEDIATELY FOLLOWING SURGERY: Do not drive or operate machinery for the first twenty four hours after surgery. Do not make any important decisions for twenty four hours after surgery or while taking narcotic pain medications or sedatives. If you develop intractable nausea and vomiting or a severe headache please notify your doctor immediately.   FOLLOW-UP: You do not need to follow up with anesthesia unless specifically instructed to do so.   WOUND CARE INSTRUCTIONS (if applicable): Expect some mild vaginal bleeding, but if large amount of bleeding occurs please contact Dr. Sondra Come at [phone] or the Radiation On-Call physician. Call for any fever greater than 101.0 degrees or increasing vaginal//abdominal pain or trouble urinating.   QUESTIONS?: Please feel free to call your physician or the hospital operator if you have any questions, and they will be happy to assist you. Resume all medications: as listed on your after visit summary. Your next appointment is:  Future Appointments Date Time Provider [place]  04/17/2016 8:30 AM [facility] 1 [facility] [place]  04/17/2016 1:00 PM Gery Pray, MD [facility] None  04/17/2016 3:00 PM Gery Pray, MD [facility] None  04/29/2016 8:30 AM [facility] LAB 2 [facility] None  04/29/2016 9:00 AM Lennis Marion Downer, MD [facility] None  04/29/2016 9:45 AM [facility] INJ NURSE [facility] None

## 2016-04-11 NOTE — Progress Notes (Signed)
  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  Identity was confirmed.  All relevant records and images related to the planned course of therapy were reviewed.  The patient freely provided informed written consent to proceed with treatment after reviewing the details related to the planned course of therapy. The consent form was witnessed and verified by the simulation staff.  Then, the patient was set-up in a stable reproducible  supine position for radiation therapy.  CT images were obtained.  Surface markings were placed.  The CT images were loaded into the planning software.  Then the target and avoidance structures were contoured.  Treatment planning then occurred.  The radiation prescription was entered and confirmed.   I have requested : Brachytherapy Isodose Plan and Dosimetry Calculations to plan the radiation distribution.    PLAN:  The patient will receive 5.5 Gy in 1 fraction.  Prescription will be to the high risk CTV. Iridium 192 will be the high-dose-rate source.  ________________________________  Blair Promise, PhD, MD   ADDENDUM:  In reviewing the patient's CT was noted that the cervical sleeve and tandem were located just anterior to the cervical mass and uterus. Therefore the patient did not proceed with her planning and treatment.  I discussed the situation with Dr. Denman George who was present in the operating room earlier today and who placed the cervical sleeve. We also discussed consideration for antibiotic therapy but since the cervical sleeve did not penetrate the peritoneum it was felt that antibiotics were not  indicated. She also does not feel that an additional attempt at placement of the tandem would be successful given the extent of tumor in the endocervical canal region. After careful discussion we felt that the patient's  next course of treatment would be to be considered for interstitial implant. The patient will be referred to Dr. Ladona Horns at [facility]. I discussed the situation extensively with the patient and her daughter and they would like to have referral for consideration of this type of brachytherapy treatment. The patient and daughter were also informed of the surprise finding of significant swelling noted in the clitoris. Biopsy results from this new finding should be available early next week.  Patient did have removal of her tandem,  ring and rectal paddle without difficulty. Some mild bleeding was noted with removal. Patient then had the sutures anchoring the cervical sleeve to the cervix clipped and the patient then had the cervical sleeve removed without difficulty. The patient and her daughter will call if she should develop a fever or significant bleeding. She is also given prescription for Percocet with anticipated   pain in the lower pelvis area from her procedure earlier today and biopsies. Follow-up schedule pending  evaluation at [facility].

## 2016-04-12 ENCOUNTER — Encounter (HOSPITAL_BASED_OUTPATIENT_CLINIC_OR_DEPARTMENT_OTHER): Payer: Self-pay | Admitting: Radiation Oncology

## 2016-04-12 NOTE — Addendum Note (Signed)
Encounter addended by: Jacqulyn Liner, RN on: 04/12/2016 10:41 AM<BR>    Actions taken: Charge Capture section accepted

## 2016-04-15 ENCOUNTER — Telehealth: Payer: Self-pay | Admitting: Gynecologic Oncology

## 2016-04-15 DIAGNOSIS — C530 Malignant neoplasm of endocervix: Secondary | ICD-10-CM

## 2016-04-15 NOTE — Telephone Encounter (Signed)
I informed Sherri Mendez of apparent subdermal spread of cancer to the clitoral tissues as confirmed on biopsy.  This tissue was apparently fixed to underlying pubic bone.  We will discuss her case at the multidisciplinary conference.  It is unlikely that she will benefit from a surgical intervention, but might benefit from a course of palliative RT to the clitoral tissues followed by chemotherapy (palliative intent).  Will plan on repeating PET/CT for restaging.  Donaciano Eva, MD

## 2016-04-16 ENCOUNTER — Telehealth: Payer: Self-pay

## 2016-04-16 NOTE — Telephone Encounter (Signed)
Orders received from [facility] to schedule a PET Scan for the patient prior to Tumor Board on Monday April 22, 2016 > Appointment was scheduled for Thursday October 19 , 2017 at 2 PM . Patient is to arrive at 1:30 PM and remain NPO six hours prior to the procedure. Gaspar Bidding was updated as well to obtain the Prior Authorization for the PET Scan. The patient was contacted and updated with date and time of PET Scan . Patient states understanding ,denies further questions at this time. Melissa Cross, APNP aware.

## 2016-04-17 ENCOUNTER — Encounter (HOSPITAL_BASED_OUTPATIENT_CLINIC_OR_DEPARTMENT_OTHER): Payer: Self-pay

## 2016-04-17 ENCOUNTER — Ambulatory Visit: Payer: Commercial Managed Care - HMO | Admitting: Radiation Oncology

## 2016-04-17 ENCOUNTER — Ambulatory Visit (HOSPITAL_COMMUNITY): Admission: RE | Admit: 2016-04-17 | Payer: Commercial Managed Care - HMO | Source: Ambulatory Visit

## 2016-04-17 ENCOUNTER — Ambulatory Visit (HOSPITAL_BASED_OUTPATIENT_CLINIC_OR_DEPARTMENT_OTHER): Admit: 2016-04-17 | Payer: Commercial Managed Care - HMO | Admitting: Radiation Oncology

## 2016-04-17 ENCOUNTER — Telehealth: Payer: Self-pay | Admitting: *Deleted

## 2016-04-17 SURGERY — INSERTION, UTERINE TANDEM AND RING OR CYLINDER, FOR BRACHYTHERAPY
Anesthesia: General

## 2016-04-17 NOTE — Telephone Encounter (Signed)
[facility] Radiation dept received referral on pt but did not get any demographic or insurance information.  I forwarded call to HIM dept to send information.

## 2016-04-18 ENCOUNTER — Encounter (HOSPITAL_COMMUNITY)
Admission: RE | Admit: 2016-04-18 | Discharge: 2016-04-18 | Disposition: A | Discharge status: Home / Self Care | Payer: Commercial Managed Care - HMO | Source: Ambulatory Visit | Attending: Gynecologic Oncology | Admitting: Gynecologic Oncology

## 2016-04-18 DIAGNOSIS — C530 Malignant neoplasm of endocervix: Secondary | ICD-10-CM | POA: Diagnosis not present

## 2016-04-18 DIAGNOSIS — C539 Malignant neoplasm of cervix uteri, unspecified: Secondary | ICD-10-CM | POA: Diagnosis not present

## 2016-04-18 LAB — GLUCOSE, CAPILLARY: GLUCOSE-CAPILLARY: 113 mg/dL — AB (ref 65–99)

## 2016-04-18 MED ORDER — FLUDEOXYGLUCOSE F - 18 (FDG) INJECTION
8.1900 | Freq: Once | INTRAVENOUS | Status: AC | PRN
  Administered 2016-04-18: 8.19 via INTRAVENOUS

## 2016-04-22 ENCOUNTER — Encounter: Payer: Self-pay | Admitting: Gynecologic Oncology

## 2016-04-22 ENCOUNTER — Telehealth: Payer: Self-pay | Admitting: Gynecologic Oncology

## 2016-04-22 NOTE — Telephone Encounter (Signed)
Discussed with patient that we presented her case at the multi-disciplinary conference today.  PET from 04/18/16 shows no other lesions besides cervical and anterior vulva. The underlying pubic bone appears grossly intact, though clinically, this anterior vulvar metastasis appears to be fixed to the bone.  The pathology from the clitoral biopsy is most consistent with dermal metastasis from the cervical cancer (overlying epithelium normal).   Recommendation is for external beam radiation to anterior vulva, with consideration for either reattempt at brachytherapy for cervical primary at a tertiary center (she has an appointment scheduled with Dr Ronnald Ramp on November 5th) or IMRT supplemental dosing to cervix here in [place] if implant is considered non-feasible at [facility].  I discussed that I recommend at least 4 cycles of carboplatin/paclitaxel/avastin after completion of chemoradiation, given the metastatic nature of her disease and my concern for distant relapse.  I discussed that this tumor has an aggressive behavior given that it had a poor response to initial chemoradiation and has dermal metastases.   Donaciano Eva, MD

## 2016-04-22 NOTE — Progress Notes (Signed)
Gynecologic Oncology Multi-Disciplinary Disposition Conference Note  Date of the Conference: April 22, 2016  Patient Name: Sherri Mendez  Referring Provider: Dr. Karsten Ro Primary GYN Oncologist: Dr. Gillian Scarce  Stage/Disposition:  Stage IIB poorly differentiated squamous cell carcinoma of the cervix.  Disposition is to evaluation at [place].  Possible radiation to the anterior vulva.  Possible boost of radiation to the cervix vs palliative intent chemotherapy.   This Multidisciplinary conference took place involving physicians from [place], [place], Radiation Oncology, Pathology, Radiology along with the Gynecologic Oncology Nurse Practitioner and RN.  Comprehensive assessment of the patient's malignancy, staging, need for surgery, chemotherapy, radiation therapy, and need for further testing were reviewed. Supportive measures, both inpatient and following discharge were also discussed. The recommended plan of care is documented. Greater than 35 minutes were spent correlating and coordinating this patient's care.

## 2016-04-23 NOTE — Progress Notes (Signed)
  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 Denied pain, urinary difficulties, or rectal/vaginal bleeding. On 04/11/16, the patient was taken to the operating room for attempted tandem ring placement in preparation for high-dose radiation therapy. CT scan in the department showed that the tandem was along the anterior portion of the uterus and would not be feasible for treatment. The tandem ring was therefore removed. Intraoperatively the patient was noted to have a mass along the clitoral area. This was biopsied and at a later date was found to be consistent with the patient's primary squamous cell carcinoma. The lesion was subdermal and therefore most consistent with metastasis.  Plan: Patient was referred to [facility] for consideration for interstitial implant provide better local control along the cervical mass. At a later date the patient will likely proceed with palliative radiation therapy directed at the clitoral mass after she completes her brachytherapy at [facility].  -----------------------------------  Blair Promise, PhD, MD  This document serves as a record of services personally performed by Gery Pray, MD. It was created on his behalf by Darcus Austin, a trained medical scribe. The creation of this record is based on the scribe's personal  observations and the provider's statements to them. This document has been checked and approved by the attending provider.

## 2016-04-24 ENCOUNTER — Encounter: Payer: Self-pay | Admitting: *Deleted

## 2016-04-24 NOTE — Progress Notes (Signed)
QI encounter 

## 2016-04-26 ENCOUNTER — Telehealth: Payer: Self-pay

## 2016-04-26 NOTE — Telephone Encounter (Signed)
Sherri Mendez wanted to know if she needs to keep her appointment with Dr. Marko Plume on Monday 04-29-16 as she is going to [facility] to see Dr. Ronnald Ramp for some radiation treatment.

## 2016-04-26 NOTE — Telephone Encounter (Signed)
LM in home VM now stating that Dr. Marko Plume will cancell her follow up on 04-29-16 and discuss with Dr. Denman George timing of further chemotherapy in relation to the radiation. Cancelled appointments. She needs to get the flu vaccine by Monday 04-29-16 if she has not had it already per Dr. Marko Plume. She can get it at a local pharmacy if she wishes. Dr. Mariana Kaufman office will be in touch with her with a follow up appointment after she confers with Dr. Denman George.

## 2016-04-29 ENCOUNTER — Ambulatory Visit: Payer: Commercial Managed Care - HMO | Admitting: Oncology

## 2016-04-29 ENCOUNTER — Ambulatory Visit: Payer: Commercial Managed Care - HMO

## 2016-04-29 ENCOUNTER — Other Ambulatory Visit: Payer: Commercial Managed Care - HMO

## 2016-05-06 DIAGNOSIS — C538 Malignant neoplasm of overlapping sites of cervix uteri: Secondary | ICD-10-CM | POA: Diagnosis not present

## 2016-05-06 DIAGNOSIS — Z923 Personal history of irradiation: Secondary | ICD-10-CM | POA: Diagnosis not present

## 2016-05-06 DIAGNOSIS — C519 Malignant neoplasm of vulva, unspecified: Secondary | ICD-10-CM | POA: Diagnosis not present

## 2016-05-06 DIAGNOSIS — Z9221 Personal history of antineoplastic chemotherapy: Secondary | ICD-10-CM | POA: Diagnosis not present

## 2016-05-06 DIAGNOSIS — C689 Malignant neoplasm of urinary organ, unspecified: Secondary | ICD-10-CM | POA: Diagnosis not present

## 2016-05-06 DIAGNOSIS — C539 Malignant neoplasm of cervix uteri, unspecified: Secondary | ICD-10-CM | POA: Diagnosis not present

## 2016-05-06 DIAGNOSIS — C679 Malignant neoplasm of bladder, unspecified: Secondary | ICD-10-CM | POA: Diagnosis not present

## 2016-05-06 DIAGNOSIS — Z510 Encounter for antineoplastic radiation therapy: Secondary | ICD-10-CM | POA: Diagnosis not present

## 2016-05-06 DIAGNOSIS — Z9889 Other specified postprocedural states: Secondary | ICD-10-CM | POA: Diagnosis not present

## 2016-05-07 DIAGNOSIS — C539 Malignant neoplasm of cervix uteri, unspecified: Secondary | ICD-10-CM | POA: Diagnosis not present

## 2016-05-14 DIAGNOSIS — C539 Malignant neoplasm of cervix uteri, unspecified: Secondary | ICD-10-CM | POA: Diagnosis not present

## 2016-05-15 DIAGNOSIS — Z87891 Personal history of nicotine dependence: Secondary | ICD-10-CM | POA: Diagnosis not present

## 2016-05-15 DIAGNOSIS — D649 Anemia, unspecified: Secondary | ICD-10-CM | POA: Diagnosis not present

## 2016-05-15 DIAGNOSIS — C7989 Secondary malignant neoplasm of other specified sites: Secondary | ICD-10-CM | POA: Diagnosis not present

## 2016-05-15 DIAGNOSIS — C539 Malignant neoplasm of cervix uteri, unspecified: Secondary | ICD-10-CM | POA: Diagnosis not present

## 2016-05-15 DIAGNOSIS — C538 Malignant neoplasm of overlapping sites of cervix uteri: Secondary | ICD-10-CM | POA: Diagnosis not present

## 2016-05-15 DIAGNOSIS — C760 Malignant neoplasm of head, face and neck: Secondary | ICD-10-CM | POA: Diagnosis not present

## 2016-05-15 DIAGNOSIS — I10 Essential (primary) hypertension: Secondary | ICD-10-CM | POA: Diagnosis not present

## 2016-05-15 DIAGNOSIS — Z96642 Presence of left artificial hip joint: Secondary | ICD-10-CM | POA: Diagnosis not present

## 2016-05-15 DIAGNOSIS — C7982 Secondary malignant neoplasm of genital organs: Secondary | ICD-10-CM | POA: Diagnosis not present

## 2016-05-22 DIAGNOSIS — C519 Malignant neoplasm of vulva, unspecified: Secondary | ICD-10-CM | POA: Diagnosis not present

## 2016-05-22 DIAGNOSIS — Z9889 Other specified postprocedural states: Secondary | ICD-10-CM | POA: Diagnosis not present

## 2016-05-22 DIAGNOSIS — Z9221 Personal history of antineoplastic chemotherapy: Secondary | ICD-10-CM | POA: Diagnosis not present

## 2016-05-22 DIAGNOSIS — C679 Malignant neoplasm of bladder, unspecified: Secondary | ICD-10-CM | POA: Diagnosis not present

## 2016-05-22 DIAGNOSIS — Z510 Encounter for antineoplastic radiation therapy: Secondary | ICD-10-CM | POA: Diagnosis not present

## 2016-05-22 DIAGNOSIS — Z923 Personal history of irradiation: Secondary | ICD-10-CM | POA: Diagnosis not present

## 2016-05-22 DIAGNOSIS — C539 Malignant neoplasm of cervix uteri, unspecified: Secondary | ICD-10-CM | POA: Diagnosis not present

## 2016-05-22 DIAGNOSIS — C538 Malignant neoplasm of overlapping sites of cervix uteri: Secondary | ICD-10-CM | POA: Diagnosis not present

## 2016-05-27 DIAGNOSIS — C539 Malignant neoplasm of cervix uteri, unspecified: Secondary | ICD-10-CM | POA: Diagnosis not present

## 2016-05-27 DIAGNOSIS — Z510 Encounter for antineoplastic radiation therapy: Secondary | ICD-10-CM | POA: Diagnosis not present

## 2016-05-27 DIAGNOSIS — Z923 Personal history of irradiation: Secondary | ICD-10-CM | POA: Diagnosis not present

## 2016-05-27 DIAGNOSIS — Z9889 Other specified postprocedural states: Secondary | ICD-10-CM | POA: Diagnosis not present

## 2016-05-27 DIAGNOSIS — C679 Malignant neoplasm of bladder, unspecified: Secondary | ICD-10-CM | POA: Diagnosis not present

## 2016-05-27 DIAGNOSIS — Z9221 Personal history of antineoplastic chemotherapy: Secondary | ICD-10-CM | POA: Diagnosis not present

## 2016-05-28 DIAGNOSIS — C539 Malignant neoplasm of cervix uteri, unspecified: Secondary | ICD-10-CM | POA: Diagnosis not present

## 2016-05-28 DIAGNOSIS — I10 Essential (primary) hypertension: Secondary | ICD-10-CM | POA: Diagnosis not present

## 2016-05-28 DIAGNOSIS — Z452 Encounter for adjustment and management of vascular access device: Secondary | ICD-10-CM | POA: Diagnosis not present

## 2016-05-28 DIAGNOSIS — Z882 Allergy status to sulfonamides status: Secondary | ICD-10-CM | POA: Diagnosis not present

## 2016-05-28 DIAGNOSIS — D630 Anemia in neoplastic disease: Secondary | ICD-10-CM | POA: Diagnosis not present

## 2016-05-30 DIAGNOSIS — Z9221 Personal history of antineoplastic chemotherapy: Secondary | ICD-10-CM | POA: Diagnosis not present

## 2016-05-30 DIAGNOSIS — C539 Malignant neoplasm of cervix uteri, unspecified: Secondary | ICD-10-CM | POA: Diagnosis not present

## 2016-05-30 DIAGNOSIS — Z9889 Other specified postprocedural states: Secondary | ICD-10-CM | POA: Diagnosis not present

## 2016-05-30 DIAGNOSIS — Z510 Encounter for antineoplastic radiation therapy: Secondary | ICD-10-CM | POA: Diagnosis not present

## 2016-05-30 DIAGNOSIS — C538 Malignant neoplasm of overlapping sites of cervix uteri: Secondary | ICD-10-CM | POA: Diagnosis not present

## 2016-05-30 DIAGNOSIS — Z923 Personal history of irradiation: Secondary | ICD-10-CM | POA: Diagnosis not present

## 2016-05-30 DIAGNOSIS — C679 Malignant neoplasm of bladder, unspecified: Secondary | ICD-10-CM | POA: Diagnosis not present

## 2016-06-10 DIAGNOSIS — Z5111 Encounter for antineoplastic chemotherapy: Secondary | ICD-10-CM | POA: Diagnosis not present

## 2016-06-10 DIAGNOSIS — C530 Malignant neoplasm of endocervix: Secondary | ICD-10-CM | POA: Diagnosis not present

## 2016-06-10 DIAGNOSIS — C538 Malignant neoplasm of overlapping sites of cervix uteri: Secondary | ICD-10-CM | POA: Diagnosis not present

## 2016-06-10 DIAGNOSIS — Z923 Personal history of irradiation: Secondary | ICD-10-CM | POA: Diagnosis not present

## 2016-06-10 DIAGNOSIS — Z9221 Personal history of antineoplastic chemotherapy: Secondary | ICD-10-CM | POA: Diagnosis not present

## 2016-06-10 DIAGNOSIS — Z510 Encounter for antineoplastic radiation therapy: Secondary | ICD-10-CM | POA: Diagnosis not present

## 2016-06-10 DIAGNOSIS — C539 Malignant neoplasm of cervix uteri, unspecified: Secondary | ICD-10-CM | POA: Diagnosis not present

## 2016-06-10 DIAGNOSIS — C679 Malignant neoplasm of bladder, unspecified: Secondary | ICD-10-CM | POA: Diagnosis not present

## 2016-06-10 DIAGNOSIS — Z9889 Other specified postprocedural states: Secondary | ICD-10-CM | POA: Diagnosis not present

## 2016-06-11 DIAGNOSIS — C539 Malignant neoplasm of cervix uteri, unspecified: Secondary | ICD-10-CM | POA: Diagnosis not present

## 2016-06-11 DIAGNOSIS — C538 Malignant neoplasm of overlapping sites of cervix uteri: Secondary | ICD-10-CM | POA: Diagnosis not present

## 2016-06-11 DIAGNOSIS — Z510 Encounter for antineoplastic radiation therapy: Secondary | ICD-10-CM | POA: Diagnosis not present

## 2016-06-11 DIAGNOSIS — Z9889 Other specified postprocedural states: Secondary | ICD-10-CM | POA: Diagnosis not present

## 2016-06-11 DIAGNOSIS — C679 Malignant neoplasm of bladder, unspecified: Secondary | ICD-10-CM | POA: Diagnosis not present

## 2016-06-11 DIAGNOSIS — Z9221 Personal history of antineoplastic chemotherapy: Secondary | ICD-10-CM | POA: Diagnosis not present

## 2016-06-11 DIAGNOSIS — Z923 Personal history of irradiation: Secondary | ICD-10-CM | POA: Diagnosis not present

## 2016-06-11 DIAGNOSIS — Z5111 Encounter for antineoplastic chemotherapy: Secondary | ICD-10-CM | POA: Diagnosis not present

## 2016-06-11 DIAGNOSIS — C530 Malignant neoplasm of endocervix: Secondary | ICD-10-CM | POA: Diagnosis not present

## 2016-06-12 DIAGNOSIS — Z510 Encounter for antineoplastic radiation therapy: Secondary | ICD-10-CM | POA: Diagnosis not present

## 2016-06-12 DIAGNOSIS — C538 Malignant neoplasm of overlapping sites of cervix uteri: Secondary | ICD-10-CM | POA: Diagnosis not present

## 2016-06-12 DIAGNOSIS — C530 Malignant neoplasm of endocervix: Secondary | ICD-10-CM | POA: Diagnosis not present

## 2016-06-12 DIAGNOSIS — Z5111 Encounter for antineoplastic chemotherapy: Secondary | ICD-10-CM | POA: Diagnosis not present

## 2016-06-12 DIAGNOSIS — C679 Malignant neoplasm of bladder, unspecified: Secondary | ICD-10-CM | POA: Diagnosis not present

## 2016-06-12 DIAGNOSIS — C539 Malignant neoplasm of cervix uteri, unspecified: Secondary | ICD-10-CM | POA: Diagnosis not present

## 2016-06-12 DIAGNOSIS — Z923 Personal history of irradiation: Secondary | ICD-10-CM | POA: Diagnosis not present

## 2016-06-12 DIAGNOSIS — Z9221 Personal history of antineoplastic chemotherapy: Secondary | ICD-10-CM | POA: Diagnosis not present

## 2016-06-12 DIAGNOSIS — Z9889 Other specified postprocedural states: Secondary | ICD-10-CM | POA: Diagnosis not present

## 2016-06-13 DIAGNOSIS — C538 Malignant neoplasm of overlapping sites of cervix uteri: Secondary | ICD-10-CM | POA: Diagnosis not present

## 2016-06-13 DIAGNOSIS — C539 Malignant neoplasm of cervix uteri, unspecified: Secondary | ICD-10-CM | POA: Diagnosis not present

## 2016-06-13 DIAGNOSIS — Z5111 Encounter for antineoplastic chemotherapy: Secondary | ICD-10-CM | POA: Diagnosis not present

## 2016-06-13 DIAGNOSIS — Z9889 Other specified postprocedural states: Secondary | ICD-10-CM | POA: Diagnosis not present

## 2016-06-13 DIAGNOSIS — C679 Malignant neoplasm of bladder, unspecified: Secondary | ICD-10-CM | POA: Diagnosis not present

## 2016-06-13 DIAGNOSIS — C530 Malignant neoplasm of endocervix: Secondary | ICD-10-CM | POA: Diagnosis not present

## 2016-06-13 DIAGNOSIS — Z923 Personal history of irradiation: Secondary | ICD-10-CM | POA: Diagnosis not present

## 2016-06-13 DIAGNOSIS — Z9221 Personal history of antineoplastic chemotherapy: Secondary | ICD-10-CM | POA: Diagnosis not present

## 2016-06-13 DIAGNOSIS — Z510 Encounter for antineoplastic radiation therapy: Secondary | ICD-10-CM | POA: Diagnosis not present

## 2016-06-14 DIAGNOSIS — C539 Malignant neoplasm of cervix uteri, unspecified: Secondary | ICD-10-CM | POA: Diagnosis not present

## 2016-06-14 DIAGNOSIS — Z9889 Other specified postprocedural states: Secondary | ICD-10-CM | POA: Diagnosis not present

## 2016-06-14 DIAGNOSIS — C538 Malignant neoplasm of overlapping sites of cervix uteri: Secondary | ICD-10-CM | POA: Diagnosis not present

## 2016-06-14 DIAGNOSIS — Z510 Encounter for antineoplastic radiation therapy: Secondary | ICD-10-CM | POA: Diagnosis not present

## 2016-06-14 DIAGNOSIS — Z9221 Personal history of antineoplastic chemotherapy: Secondary | ICD-10-CM | POA: Diagnosis not present

## 2016-06-14 DIAGNOSIS — C530 Malignant neoplasm of endocervix: Secondary | ICD-10-CM | POA: Diagnosis not present

## 2016-06-14 DIAGNOSIS — Z5111 Encounter for antineoplastic chemotherapy: Secondary | ICD-10-CM | POA: Diagnosis not present

## 2016-06-14 DIAGNOSIS — C679 Malignant neoplasm of bladder, unspecified: Secondary | ICD-10-CM | POA: Diagnosis not present

## 2016-06-14 DIAGNOSIS — Z923 Personal history of irradiation: Secondary | ICD-10-CM | POA: Diagnosis not present

## 2016-06-17 DIAGNOSIS — C539 Malignant neoplasm of cervix uteri, unspecified: Secondary | ICD-10-CM | POA: Diagnosis not present

## 2016-06-17 DIAGNOSIS — Z510 Encounter for antineoplastic radiation therapy: Secondary | ICD-10-CM | POA: Diagnosis not present

## 2016-06-17 DIAGNOSIS — C679 Malignant neoplasm of bladder, unspecified: Secondary | ICD-10-CM | POA: Diagnosis not present

## 2016-06-17 DIAGNOSIS — Z9221 Personal history of antineoplastic chemotherapy: Secondary | ICD-10-CM | POA: Diagnosis not present

## 2016-06-17 DIAGNOSIS — C530 Malignant neoplasm of endocervix: Secondary | ICD-10-CM | POA: Diagnosis not present

## 2016-06-17 DIAGNOSIS — Z923 Personal history of irradiation: Secondary | ICD-10-CM | POA: Diagnosis not present

## 2016-06-17 DIAGNOSIS — Z9889 Other specified postprocedural states: Secondary | ICD-10-CM | POA: Diagnosis not present

## 2016-06-17 DIAGNOSIS — Z5111 Encounter for antineoplastic chemotherapy: Secondary | ICD-10-CM | POA: Diagnosis not present

## 2016-06-17 DIAGNOSIS — C538 Malignant neoplasm of overlapping sites of cervix uteri: Secondary | ICD-10-CM | POA: Diagnosis not present

## 2016-06-18 DIAGNOSIS — C539 Malignant neoplasm of cervix uteri, unspecified: Secondary | ICD-10-CM | POA: Diagnosis not present

## 2016-06-18 DIAGNOSIS — Z9889 Other specified postprocedural states: Secondary | ICD-10-CM | POA: Diagnosis not present

## 2016-06-18 DIAGNOSIS — C538 Malignant neoplasm of overlapping sites of cervix uteri: Secondary | ICD-10-CM | POA: Diagnosis not present

## 2016-06-18 DIAGNOSIS — Z5111 Encounter for antineoplastic chemotherapy: Secondary | ICD-10-CM | POA: Diagnosis not present

## 2016-06-18 DIAGNOSIS — C679 Malignant neoplasm of bladder, unspecified: Secondary | ICD-10-CM | POA: Diagnosis not present

## 2016-06-18 DIAGNOSIS — Z9221 Personal history of antineoplastic chemotherapy: Secondary | ICD-10-CM | POA: Diagnosis not present

## 2016-06-18 DIAGNOSIS — Z923 Personal history of irradiation: Secondary | ICD-10-CM | POA: Diagnosis not present

## 2016-06-18 DIAGNOSIS — Z510 Encounter for antineoplastic radiation therapy: Secondary | ICD-10-CM | POA: Diagnosis not present

## 2016-06-18 DIAGNOSIS — C530 Malignant neoplasm of endocervix: Secondary | ICD-10-CM | POA: Diagnosis not present

## 2016-06-19 DIAGNOSIS — Z9889 Other specified postprocedural states: Secondary | ICD-10-CM | POA: Diagnosis not present

## 2016-06-19 DIAGNOSIS — C539 Malignant neoplasm of cervix uteri, unspecified: Secondary | ICD-10-CM | POA: Diagnosis not present

## 2016-06-19 DIAGNOSIS — Z9221 Personal history of antineoplastic chemotherapy: Secondary | ICD-10-CM | POA: Diagnosis not present

## 2016-06-19 DIAGNOSIS — C538 Malignant neoplasm of overlapping sites of cervix uteri: Secondary | ICD-10-CM | POA: Diagnosis not present

## 2016-06-19 DIAGNOSIS — Z923 Personal history of irradiation: Secondary | ICD-10-CM | POA: Diagnosis not present

## 2016-06-19 DIAGNOSIS — C679 Malignant neoplasm of bladder, unspecified: Secondary | ICD-10-CM | POA: Diagnosis not present

## 2016-06-19 DIAGNOSIS — Z510 Encounter for antineoplastic radiation therapy: Secondary | ICD-10-CM | POA: Diagnosis not present

## 2016-06-19 DIAGNOSIS — C530 Malignant neoplasm of endocervix: Secondary | ICD-10-CM | POA: Diagnosis not present

## 2016-06-19 DIAGNOSIS — Z5111 Encounter for antineoplastic chemotherapy: Secondary | ICD-10-CM | POA: Diagnosis not present

## 2016-06-20 DIAGNOSIS — Z923 Personal history of irradiation: Secondary | ICD-10-CM | POA: Diagnosis not present

## 2016-06-20 DIAGNOSIS — Z510 Encounter for antineoplastic radiation therapy: Secondary | ICD-10-CM | POA: Diagnosis not present

## 2016-06-20 DIAGNOSIS — Z9221 Personal history of antineoplastic chemotherapy: Secondary | ICD-10-CM | POA: Diagnosis not present

## 2016-06-20 DIAGNOSIS — C679 Malignant neoplasm of bladder, unspecified: Secondary | ICD-10-CM | POA: Diagnosis not present

## 2016-06-20 DIAGNOSIS — C530 Malignant neoplasm of endocervix: Secondary | ICD-10-CM | POA: Diagnosis not present

## 2016-06-20 DIAGNOSIS — C539 Malignant neoplasm of cervix uteri, unspecified: Secondary | ICD-10-CM | POA: Diagnosis not present

## 2016-06-20 DIAGNOSIS — Z9889 Other specified postprocedural states: Secondary | ICD-10-CM | POA: Diagnosis not present

## 2016-06-20 DIAGNOSIS — Z5111 Encounter for antineoplastic chemotherapy: Secondary | ICD-10-CM | POA: Diagnosis not present

## 2016-06-20 DIAGNOSIS — C538 Malignant neoplasm of overlapping sites of cervix uteri: Secondary | ICD-10-CM | POA: Diagnosis not present

## 2016-06-21 DIAGNOSIS — Z923 Personal history of irradiation: Secondary | ICD-10-CM | POA: Diagnosis not present

## 2016-06-21 DIAGNOSIS — Z510 Encounter for antineoplastic radiation therapy: Secondary | ICD-10-CM | POA: Diagnosis not present

## 2016-06-21 DIAGNOSIS — C539 Malignant neoplasm of cervix uteri, unspecified: Secondary | ICD-10-CM | POA: Diagnosis not present

## 2016-06-21 DIAGNOSIS — Z9221 Personal history of antineoplastic chemotherapy: Secondary | ICD-10-CM | POA: Diagnosis not present

## 2016-06-21 DIAGNOSIS — C530 Malignant neoplasm of endocervix: Secondary | ICD-10-CM | POA: Diagnosis not present

## 2016-06-21 DIAGNOSIS — Z5111 Encounter for antineoplastic chemotherapy: Secondary | ICD-10-CM | POA: Diagnosis not present

## 2016-06-21 DIAGNOSIS — C679 Malignant neoplasm of bladder, unspecified: Secondary | ICD-10-CM | POA: Diagnosis not present

## 2016-06-21 DIAGNOSIS — Z9889 Other specified postprocedural states: Secondary | ICD-10-CM | POA: Diagnosis not present

## 2016-06-21 DIAGNOSIS — C538 Malignant neoplasm of overlapping sites of cervix uteri: Secondary | ICD-10-CM | POA: Diagnosis not present

## 2016-06-25 DIAGNOSIS — C539 Malignant neoplasm of cervix uteri, unspecified: Secondary | ICD-10-CM | POA: Diagnosis not present

## 2016-06-25 DIAGNOSIS — Z9221 Personal history of antineoplastic chemotherapy: Secondary | ICD-10-CM | POA: Diagnosis not present

## 2016-06-25 DIAGNOSIS — C538 Malignant neoplasm of overlapping sites of cervix uteri: Secondary | ICD-10-CM | POA: Diagnosis not present

## 2016-06-25 DIAGNOSIS — Z9889 Other specified postprocedural states: Secondary | ICD-10-CM | POA: Diagnosis not present

## 2016-06-25 DIAGNOSIS — Z5111 Encounter for antineoplastic chemotherapy: Secondary | ICD-10-CM | POA: Diagnosis not present

## 2016-06-25 DIAGNOSIS — C530 Malignant neoplasm of endocervix: Secondary | ICD-10-CM | POA: Diagnosis not present

## 2016-06-25 DIAGNOSIS — C679 Malignant neoplasm of bladder, unspecified: Secondary | ICD-10-CM | POA: Diagnosis not present

## 2016-06-25 DIAGNOSIS — Z923 Personal history of irradiation: Secondary | ICD-10-CM | POA: Diagnosis not present

## 2016-06-25 DIAGNOSIS — Z510 Encounter for antineoplastic radiation therapy: Secondary | ICD-10-CM | POA: Diagnosis not present

## 2016-06-26 DIAGNOSIS — Z510 Encounter for antineoplastic radiation therapy: Secondary | ICD-10-CM | POA: Diagnosis not present

## 2016-06-26 DIAGNOSIS — Z9889 Other specified postprocedural states: Secondary | ICD-10-CM | POA: Diagnosis not present

## 2016-06-26 DIAGNOSIS — Z5111 Encounter for antineoplastic chemotherapy: Secondary | ICD-10-CM | POA: Diagnosis not present

## 2016-06-26 DIAGNOSIS — C679 Malignant neoplasm of bladder, unspecified: Secondary | ICD-10-CM | POA: Diagnosis not present

## 2016-06-26 DIAGNOSIS — Z923 Personal history of irradiation: Secondary | ICD-10-CM | POA: Diagnosis not present

## 2016-06-26 DIAGNOSIS — Z9221 Personal history of antineoplastic chemotherapy: Secondary | ICD-10-CM | POA: Diagnosis not present

## 2016-06-26 DIAGNOSIS — C530 Malignant neoplasm of endocervix: Secondary | ICD-10-CM | POA: Diagnosis not present

## 2016-06-26 DIAGNOSIS — C538 Malignant neoplasm of overlapping sites of cervix uteri: Secondary | ICD-10-CM | POA: Diagnosis not present

## 2016-06-26 DIAGNOSIS — C539 Malignant neoplasm of cervix uteri, unspecified: Secondary | ICD-10-CM | POA: Diagnosis not present

## 2016-06-27 DIAGNOSIS — C539 Malignant neoplasm of cervix uteri, unspecified: Secondary | ICD-10-CM | POA: Diagnosis not present

## 2016-06-27 DIAGNOSIS — Z923 Personal history of irradiation: Secondary | ICD-10-CM | POA: Diagnosis not present

## 2016-06-27 DIAGNOSIS — C530 Malignant neoplasm of endocervix: Secondary | ICD-10-CM | POA: Diagnosis not present

## 2016-06-27 DIAGNOSIS — C679 Malignant neoplasm of bladder, unspecified: Secondary | ICD-10-CM | POA: Diagnosis not present

## 2016-06-27 DIAGNOSIS — Z9221 Personal history of antineoplastic chemotherapy: Secondary | ICD-10-CM | POA: Diagnosis not present

## 2016-06-27 DIAGNOSIS — Z510 Encounter for antineoplastic radiation therapy: Secondary | ICD-10-CM | POA: Diagnosis not present

## 2016-06-27 DIAGNOSIS — C538 Malignant neoplasm of overlapping sites of cervix uteri: Secondary | ICD-10-CM | POA: Diagnosis not present

## 2016-06-27 DIAGNOSIS — Z5111 Encounter for antineoplastic chemotherapy: Secondary | ICD-10-CM | POA: Diagnosis not present

## 2016-06-27 DIAGNOSIS — Z9889 Other specified postprocedural states: Secondary | ICD-10-CM | POA: Diagnosis not present

## 2016-06-28 DIAGNOSIS — Z9221 Personal history of antineoplastic chemotherapy: Secondary | ICD-10-CM | POA: Diagnosis not present

## 2016-06-28 DIAGNOSIS — Z923 Personal history of irradiation: Secondary | ICD-10-CM | POA: Diagnosis not present

## 2016-06-28 DIAGNOSIS — Z5111 Encounter for antineoplastic chemotherapy: Secondary | ICD-10-CM | POA: Diagnosis not present

## 2016-06-28 DIAGNOSIS — C530 Malignant neoplasm of endocervix: Secondary | ICD-10-CM | POA: Diagnosis not present

## 2016-06-28 DIAGNOSIS — Z9889 Other specified postprocedural states: Secondary | ICD-10-CM | POA: Diagnosis not present

## 2016-06-28 DIAGNOSIS — C539 Malignant neoplasm of cervix uteri, unspecified: Secondary | ICD-10-CM | POA: Diagnosis not present

## 2016-06-28 DIAGNOSIS — C538 Malignant neoplasm of overlapping sites of cervix uteri: Secondary | ICD-10-CM | POA: Diagnosis not present

## 2016-06-28 DIAGNOSIS — Z510 Encounter for antineoplastic radiation therapy: Secondary | ICD-10-CM | POA: Diagnosis not present

## 2016-06-28 DIAGNOSIS — C679 Malignant neoplasm of bladder, unspecified: Secondary | ICD-10-CM | POA: Diagnosis not present

## 2016-07-02 DIAGNOSIS — Z5111 Encounter for antineoplastic chemotherapy: Secondary | ICD-10-CM | POA: Diagnosis not present

## 2016-07-02 DIAGNOSIS — C538 Malignant neoplasm of overlapping sites of cervix uteri: Secondary | ICD-10-CM | POA: Diagnosis not present

## 2016-07-02 DIAGNOSIS — C539 Malignant neoplasm of cervix uteri, unspecified: Secondary | ICD-10-CM | POA: Diagnosis not present

## 2016-07-02 DIAGNOSIS — C679 Malignant neoplasm of bladder, unspecified: Secondary | ICD-10-CM | POA: Diagnosis not present

## 2016-07-02 DIAGNOSIS — Z510 Encounter for antineoplastic radiation therapy: Secondary | ICD-10-CM | POA: Diagnosis not present

## 2016-07-02 DIAGNOSIS — Z9889 Other specified postprocedural states: Secondary | ICD-10-CM | POA: Diagnosis not present

## 2016-07-02 DIAGNOSIS — Z79899 Other long term (current) drug therapy: Secondary | ICD-10-CM | POA: Diagnosis not present

## 2016-07-02 DIAGNOSIS — Z9221 Personal history of antineoplastic chemotherapy: Secondary | ICD-10-CM | POA: Diagnosis not present

## 2016-07-02 DIAGNOSIS — Z923 Personal history of irradiation: Secondary | ICD-10-CM | POA: Diagnosis not present

## 2016-07-03 DIAGNOSIS — Z5111 Encounter for antineoplastic chemotherapy: Secondary | ICD-10-CM | POA: Diagnosis not present

## 2016-07-03 DIAGNOSIS — C679 Malignant neoplasm of bladder, unspecified: Secondary | ICD-10-CM | POA: Diagnosis not present

## 2016-07-03 DIAGNOSIS — Z923 Personal history of irradiation: Secondary | ICD-10-CM | POA: Diagnosis not present

## 2016-07-03 DIAGNOSIS — Z510 Encounter for antineoplastic radiation therapy: Secondary | ICD-10-CM | POA: Diagnosis not present

## 2016-07-03 DIAGNOSIS — Z9221 Personal history of antineoplastic chemotherapy: Secondary | ICD-10-CM | POA: Diagnosis not present

## 2016-07-03 DIAGNOSIS — C539 Malignant neoplasm of cervix uteri, unspecified: Secondary | ICD-10-CM | POA: Diagnosis not present

## 2016-07-03 DIAGNOSIS — Z9889 Other specified postprocedural states: Secondary | ICD-10-CM | POA: Diagnosis not present

## 2016-07-03 DIAGNOSIS — Z79899 Other long term (current) drug therapy: Secondary | ICD-10-CM | POA: Diagnosis not present

## 2016-07-03 DIAGNOSIS — C538 Malignant neoplasm of overlapping sites of cervix uteri: Secondary | ICD-10-CM | POA: Diagnosis not present

## 2016-07-04 DIAGNOSIS — C679 Malignant neoplasm of bladder, unspecified: Secondary | ICD-10-CM | POA: Diagnosis not present

## 2016-07-04 DIAGNOSIS — C539 Malignant neoplasm of cervix uteri, unspecified: Secondary | ICD-10-CM | POA: Diagnosis not present

## 2016-07-04 DIAGNOSIS — Z510 Encounter for antineoplastic radiation therapy: Secondary | ICD-10-CM | POA: Diagnosis not present

## 2016-07-04 DIAGNOSIS — Z9221 Personal history of antineoplastic chemotherapy: Secondary | ICD-10-CM | POA: Diagnosis not present

## 2016-07-04 DIAGNOSIS — Z5111 Encounter for antineoplastic chemotherapy: Secondary | ICD-10-CM | POA: Diagnosis not present

## 2016-07-04 DIAGNOSIS — Z923 Personal history of irradiation: Secondary | ICD-10-CM | POA: Diagnosis not present

## 2016-07-04 DIAGNOSIS — C538 Malignant neoplasm of overlapping sites of cervix uteri: Secondary | ICD-10-CM | POA: Diagnosis not present

## 2016-07-04 DIAGNOSIS — Z9889 Other specified postprocedural states: Secondary | ICD-10-CM | POA: Diagnosis not present

## 2016-07-04 DIAGNOSIS — Z79899 Other long term (current) drug therapy: Secondary | ICD-10-CM | POA: Diagnosis not present

## 2016-07-05 DIAGNOSIS — C539 Malignant neoplasm of cervix uteri, unspecified: Secondary | ICD-10-CM | POA: Diagnosis not present

## 2016-07-05 DIAGNOSIS — Z79899 Other long term (current) drug therapy: Secondary | ICD-10-CM | POA: Diagnosis not present

## 2016-07-05 DIAGNOSIS — Z923 Personal history of irradiation: Secondary | ICD-10-CM | POA: Diagnosis not present

## 2016-07-05 DIAGNOSIS — Z9889 Other specified postprocedural states: Secondary | ICD-10-CM | POA: Diagnosis not present

## 2016-07-05 DIAGNOSIS — Z510 Encounter for antineoplastic radiation therapy: Secondary | ICD-10-CM | POA: Diagnosis not present

## 2016-07-05 DIAGNOSIS — C538 Malignant neoplasm of overlapping sites of cervix uteri: Secondary | ICD-10-CM | POA: Diagnosis not present

## 2016-07-05 DIAGNOSIS — Z5111 Encounter for antineoplastic chemotherapy: Secondary | ICD-10-CM | POA: Diagnosis not present

## 2016-07-05 DIAGNOSIS — C679 Malignant neoplasm of bladder, unspecified: Secondary | ICD-10-CM | POA: Diagnosis not present

## 2016-07-05 DIAGNOSIS — Z9221 Personal history of antineoplastic chemotherapy: Secondary | ICD-10-CM | POA: Diagnosis not present

## 2016-07-08 DIAGNOSIS — Z923 Personal history of irradiation: Secondary | ICD-10-CM | POA: Diagnosis not present

## 2016-07-08 DIAGNOSIS — C539 Malignant neoplasm of cervix uteri, unspecified: Secondary | ICD-10-CM | POA: Diagnosis not present

## 2016-07-08 DIAGNOSIS — Z9889 Other specified postprocedural states: Secondary | ICD-10-CM | POA: Diagnosis not present

## 2016-07-08 DIAGNOSIS — Z9221 Personal history of antineoplastic chemotherapy: Secondary | ICD-10-CM | POA: Diagnosis not present

## 2016-07-08 DIAGNOSIS — C538 Malignant neoplasm of overlapping sites of cervix uteri: Secondary | ICD-10-CM | POA: Diagnosis not present

## 2016-07-08 DIAGNOSIS — Z79899 Other long term (current) drug therapy: Secondary | ICD-10-CM | POA: Diagnosis not present

## 2016-07-08 DIAGNOSIS — C679 Malignant neoplasm of bladder, unspecified: Secondary | ICD-10-CM | POA: Diagnosis not present

## 2016-07-08 DIAGNOSIS — Z510 Encounter for antineoplastic radiation therapy: Secondary | ICD-10-CM | POA: Diagnosis not present

## 2016-07-08 DIAGNOSIS — Z5111 Encounter for antineoplastic chemotherapy: Secondary | ICD-10-CM | POA: Diagnosis not present

## 2016-07-09 DIAGNOSIS — Z9221 Personal history of antineoplastic chemotherapy: Secondary | ICD-10-CM | POA: Diagnosis not present

## 2016-07-09 DIAGNOSIS — C539 Malignant neoplasm of cervix uteri, unspecified: Secondary | ICD-10-CM | POA: Diagnosis not present

## 2016-07-09 DIAGNOSIS — Z923 Personal history of irradiation: Secondary | ICD-10-CM | POA: Diagnosis not present

## 2016-07-09 DIAGNOSIS — Z9889 Other specified postprocedural states: Secondary | ICD-10-CM | POA: Diagnosis not present

## 2016-07-09 DIAGNOSIS — Z5111 Encounter for antineoplastic chemotherapy: Secondary | ICD-10-CM | POA: Diagnosis not present

## 2016-07-09 DIAGNOSIS — Z79899 Other long term (current) drug therapy: Secondary | ICD-10-CM | POA: Diagnosis not present

## 2016-07-09 DIAGNOSIS — C538 Malignant neoplasm of overlapping sites of cervix uteri: Secondary | ICD-10-CM | POA: Diagnosis not present

## 2016-07-09 DIAGNOSIS — Z510 Encounter for antineoplastic radiation therapy: Secondary | ICD-10-CM | POA: Diagnosis not present

## 2016-07-09 DIAGNOSIS — C679 Malignant neoplasm of bladder, unspecified: Secondary | ICD-10-CM | POA: Diagnosis not present

## 2016-07-10 DIAGNOSIS — Z9889 Other specified postprocedural states: Secondary | ICD-10-CM | POA: Diagnosis not present

## 2016-07-10 DIAGNOSIS — C539 Malignant neoplasm of cervix uteri, unspecified: Secondary | ICD-10-CM | POA: Diagnosis not present

## 2016-07-10 DIAGNOSIS — Z923 Personal history of irradiation: Secondary | ICD-10-CM | POA: Diagnosis not present

## 2016-07-10 DIAGNOSIS — C538 Malignant neoplasm of overlapping sites of cervix uteri: Secondary | ICD-10-CM | POA: Diagnosis not present

## 2016-07-10 DIAGNOSIS — C679 Malignant neoplasm of bladder, unspecified: Secondary | ICD-10-CM | POA: Diagnosis not present

## 2016-07-10 DIAGNOSIS — Z5111 Encounter for antineoplastic chemotherapy: Secondary | ICD-10-CM | POA: Diagnosis not present

## 2016-07-10 DIAGNOSIS — Z9221 Personal history of antineoplastic chemotherapy: Secondary | ICD-10-CM | POA: Diagnosis not present

## 2016-07-10 DIAGNOSIS — Z79899 Other long term (current) drug therapy: Secondary | ICD-10-CM | POA: Diagnosis not present

## 2016-07-10 DIAGNOSIS — Z510 Encounter for antineoplastic radiation therapy: Secondary | ICD-10-CM | POA: Diagnosis not present

## 2016-07-11 DIAGNOSIS — C538 Malignant neoplasm of overlapping sites of cervix uteri: Secondary | ICD-10-CM | POA: Diagnosis not present

## 2016-07-11 DIAGNOSIS — Z79899 Other long term (current) drug therapy: Secondary | ICD-10-CM | POA: Diagnosis not present

## 2016-07-11 DIAGNOSIS — Z9221 Personal history of antineoplastic chemotherapy: Secondary | ICD-10-CM | POA: Diagnosis not present

## 2016-07-11 DIAGNOSIS — Z923 Personal history of irradiation: Secondary | ICD-10-CM | POA: Diagnosis not present

## 2016-07-11 DIAGNOSIS — C539 Malignant neoplasm of cervix uteri, unspecified: Secondary | ICD-10-CM | POA: Diagnosis not present

## 2016-07-11 DIAGNOSIS — C679 Malignant neoplasm of bladder, unspecified: Secondary | ICD-10-CM | POA: Diagnosis not present

## 2016-07-11 DIAGNOSIS — Z510 Encounter for antineoplastic radiation therapy: Secondary | ICD-10-CM | POA: Diagnosis not present

## 2016-07-11 DIAGNOSIS — Z5111 Encounter for antineoplastic chemotherapy: Secondary | ICD-10-CM | POA: Diagnosis not present

## 2016-07-11 DIAGNOSIS — Z9889 Other specified postprocedural states: Secondary | ICD-10-CM | POA: Diagnosis not present

## 2016-07-12 DIAGNOSIS — C539 Malignant neoplasm of cervix uteri, unspecified: Secondary | ICD-10-CM | POA: Diagnosis not present

## 2016-07-12 DIAGNOSIS — Z923 Personal history of irradiation: Secondary | ICD-10-CM | POA: Diagnosis not present

## 2016-07-12 DIAGNOSIS — Z5111 Encounter for antineoplastic chemotherapy: Secondary | ICD-10-CM | POA: Diagnosis not present

## 2016-07-12 DIAGNOSIS — C679 Malignant neoplasm of bladder, unspecified: Secondary | ICD-10-CM | POA: Diagnosis not present

## 2016-07-12 DIAGNOSIS — Z79899 Other long term (current) drug therapy: Secondary | ICD-10-CM | POA: Diagnosis not present

## 2016-07-12 DIAGNOSIS — Z510 Encounter for antineoplastic radiation therapy: Secondary | ICD-10-CM | POA: Diagnosis not present

## 2016-07-12 DIAGNOSIS — Z9221 Personal history of antineoplastic chemotherapy: Secondary | ICD-10-CM | POA: Diagnosis not present

## 2016-07-12 DIAGNOSIS — C538 Malignant neoplasm of overlapping sites of cervix uteri: Secondary | ICD-10-CM | POA: Diagnosis not present

## 2016-07-12 DIAGNOSIS — Z9889 Other specified postprocedural states: Secondary | ICD-10-CM | POA: Diagnosis not present

## 2016-07-16 DIAGNOSIS — Z9889 Other specified postprocedural states: Secondary | ICD-10-CM | POA: Diagnosis not present

## 2016-07-16 DIAGNOSIS — Z79899 Other long term (current) drug therapy: Secondary | ICD-10-CM | POA: Diagnosis not present

## 2016-07-16 DIAGNOSIS — Z9221 Personal history of antineoplastic chemotherapy: Secondary | ICD-10-CM | POA: Diagnosis not present

## 2016-07-16 DIAGNOSIS — C539 Malignant neoplasm of cervix uteri, unspecified: Secondary | ICD-10-CM | POA: Diagnosis not present

## 2016-07-16 DIAGNOSIS — Z510 Encounter for antineoplastic radiation therapy: Secondary | ICD-10-CM | POA: Diagnosis not present

## 2016-07-16 DIAGNOSIS — C538 Malignant neoplasm of overlapping sites of cervix uteri: Secondary | ICD-10-CM | POA: Diagnosis not present

## 2016-07-16 DIAGNOSIS — C679 Malignant neoplasm of bladder, unspecified: Secondary | ICD-10-CM | POA: Diagnosis not present

## 2016-07-16 DIAGNOSIS — Z923 Personal history of irradiation: Secondary | ICD-10-CM | POA: Diagnosis not present

## 2016-07-16 DIAGNOSIS — Z5111 Encounter for antineoplastic chemotherapy: Secondary | ICD-10-CM | POA: Diagnosis not present

## 2016-07-19 DIAGNOSIS — Z9221 Personal history of antineoplastic chemotherapy: Secondary | ICD-10-CM | POA: Diagnosis not present

## 2016-07-19 DIAGNOSIS — C679 Malignant neoplasm of bladder, unspecified: Secondary | ICD-10-CM | POA: Diagnosis not present

## 2016-07-19 DIAGNOSIS — Z79899 Other long term (current) drug therapy: Secondary | ICD-10-CM | POA: Diagnosis not present

## 2016-07-19 DIAGNOSIS — Z510 Encounter for antineoplastic radiation therapy: Secondary | ICD-10-CM | POA: Diagnosis not present

## 2016-07-19 DIAGNOSIS — C539 Malignant neoplasm of cervix uteri, unspecified: Secondary | ICD-10-CM | POA: Diagnosis not present

## 2016-07-19 DIAGNOSIS — Z923 Personal history of irradiation: Secondary | ICD-10-CM | POA: Diagnosis not present

## 2016-07-19 DIAGNOSIS — C538 Malignant neoplasm of overlapping sites of cervix uteri: Secondary | ICD-10-CM | POA: Diagnosis not present

## 2016-07-19 DIAGNOSIS — Z9889 Other specified postprocedural states: Secondary | ICD-10-CM | POA: Diagnosis not present

## 2016-07-19 DIAGNOSIS — Z5111 Encounter for antineoplastic chemotherapy: Secondary | ICD-10-CM | POA: Diagnosis not present

## 2016-07-22 DIAGNOSIS — Z9221 Personal history of antineoplastic chemotherapy: Secondary | ICD-10-CM | POA: Diagnosis not present

## 2016-07-22 DIAGNOSIS — C679 Malignant neoplasm of bladder, unspecified: Secondary | ICD-10-CM | POA: Diagnosis not present

## 2016-07-22 DIAGNOSIS — Z923 Personal history of irradiation: Secondary | ICD-10-CM | POA: Diagnosis not present

## 2016-07-22 DIAGNOSIS — C539 Malignant neoplasm of cervix uteri, unspecified: Secondary | ICD-10-CM | POA: Diagnosis not present

## 2016-07-22 DIAGNOSIS — Z510 Encounter for antineoplastic radiation therapy: Secondary | ICD-10-CM | POA: Diagnosis not present

## 2016-07-22 DIAGNOSIS — Z9889 Other specified postprocedural states: Secondary | ICD-10-CM | POA: Diagnosis not present

## 2016-07-22 DIAGNOSIS — C538 Malignant neoplasm of overlapping sites of cervix uteri: Secondary | ICD-10-CM | POA: Diagnosis not present

## 2016-07-22 DIAGNOSIS — Z79899 Other long term (current) drug therapy: Secondary | ICD-10-CM | POA: Diagnosis not present

## 2016-07-22 DIAGNOSIS — Z5111 Encounter for antineoplastic chemotherapy: Secondary | ICD-10-CM | POA: Diagnosis not present

## 2016-07-23 DIAGNOSIS — Z9889 Other specified postprocedural states: Secondary | ICD-10-CM | POA: Diagnosis not present

## 2016-07-23 DIAGNOSIS — Z9221 Personal history of antineoplastic chemotherapy: Secondary | ICD-10-CM | POA: Diagnosis not present

## 2016-07-23 DIAGNOSIS — Z923 Personal history of irradiation: Secondary | ICD-10-CM | POA: Diagnosis not present

## 2016-07-23 DIAGNOSIS — Z510 Encounter for antineoplastic radiation therapy: Secondary | ICD-10-CM | POA: Diagnosis not present

## 2016-07-23 DIAGNOSIS — C539 Malignant neoplasm of cervix uteri, unspecified: Secondary | ICD-10-CM | POA: Diagnosis not present

## 2016-07-23 DIAGNOSIS — C679 Malignant neoplasm of bladder, unspecified: Secondary | ICD-10-CM | POA: Diagnosis not present

## 2016-07-23 DIAGNOSIS — Z79899 Other long term (current) drug therapy: Secondary | ICD-10-CM | POA: Diagnosis not present

## 2016-07-23 DIAGNOSIS — Z5111 Encounter for antineoplastic chemotherapy: Secondary | ICD-10-CM | POA: Diagnosis not present

## 2016-07-23 DIAGNOSIS — C538 Malignant neoplasm of overlapping sites of cervix uteri: Secondary | ICD-10-CM | POA: Diagnosis not present

## 2016-07-24 DIAGNOSIS — C538 Malignant neoplasm of overlapping sites of cervix uteri: Secondary | ICD-10-CM | POA: Diagnosis not present

## 2016-07-24 DIAGNOSIS — Z9221 Personal history of antineoplastic chemotherapy: Secondary | ICD-10-CM | POA: Diagnosis not present

## 2016-07-24 DIAGNOSIS — Z79899 Other long term (current) drug therapy: Secondary | ICD-10-CM | POA: Diagnosis not present

## 2016-07-24 DIAGNOSIS — Z9889 Other specified postprocedural states: Secondary | ICD-10-CM | POA: Diagnosis not present

## 2016-07-24 DIAGNOSIS — Z923 Personal history of irradiation: Secondary | ICD-10-CM | POA: Diagnosis not present

## 2016-07-24 DIAGNOSIS — Z5111 Encounter for antineoplastic chemotherapy: Secondary | ICD-10-CM | POA: Diagnosis not present

## 2016-07-24 DIAGNOSIS — Z510 Encounter for antineoplastic radiation therapy: Secondary | ICD-10-CM | POA: Diagnosis not present

## 2016-07-24 DIAGNOSIS — C539 Malignant neoplasm of cervix uteri, unspecified: Secondary | ICD-10-CM | POA: Diagnosis not present

## 2016-07-24 DIAGNOSIS — C679 Malignant neoplasm of bladder, unspecified: Secondary | ICD-10-CM | POA: Diagnosis not present

## 2016-07-25 DIAGNOSIS — Z9221 Personal history of antineoplastic chemotherapy: Secondary | ICD-10-CM | POA: Diagnosis not present

## 2016-07-25 DIAGNOSIS — C539 Malignant neoplasm of cervix uteri, unspecified: Secondary | ICD-10-CM | POA: Diagnosis not present

## 2016-07-25 DIAGNOSIS — Z510 Encounter for antineoplastic radiation therapy: Secondary | ICD-10-CM | POA: Diagnosis not present

## 2016-07-25 DIAGNOSIS — Z923 Personal history of irradiation: Secondary | ICD-10-CM | POA: Diagnosis not present

## 2016-07-25 DIAGNOSIS — C679 Malignant neoplasm of bladder, unspecified: Secondary | ICD-10-CM | POA: Diagnosis not present

## 2016-07-25 DIAGNOSIS — Z9889 Other specified postprocedural states: Secondary | ICD-10-CM | POA: Diagnosis not present

## 2016-07-25 DIAGNOSIS — Z79899 Other long term (current) drug therapy: Secondary | ICD-10-CM | POA: Diagnosis not present

## 2016-07-25 DIAGNOSIS — Z5111 Encounter for antineoplastic chemotherapy: Secondary | ICD-10-CM | POA: Diagnosis not present

## 2016-07-25 DIAGNOSIS — C538 Malignant neoplasm of overlapping sites of cervix uteri: Secondary | ICD-10-CM | POA: Diagnosis not present

## 2016-07-26 DIAGNOSIS — C539 Malignant neoplasm of cervix uteri, unspecified: Secondary | ICD-10-CM | POA: Diagnosis not present

## 2016-07-26 DIAGNOSIS — C538 Malignant neoplasm of overlapping sites of cervix uteri: Secondary | ICD-10-CM | POA: Diagnosis not present

## 2016-07-26 DIAGNOSIS — Z9889 Other specified postprocedural states: Secondary | ICD-10-CM | POA: Diagnosis not present

## 2016-07-26 DIAGNOSIS — C679 Malignant neoplasm of bladder, unspecified: Secondary | ICD-10-CM | POA: Diagnosis not present

## 2016-07-26 DIAGNOSIS — Z79899 Other long term (current) drug therapy: Secondary | ICD-10-CM | POA: Diagnosis not present

## 2016-07-26 DIAGNOSIS — Z5111 Encounter for antineoplastic chemotherapy: Secondary | ICD-10-CM | POA: Diagnosis not present

## 2016-07-26 DIAGNOSIS — Z510 Encounter for antineoplastic radiation therapy: Secondary | ICD-10-CM | POA: Diagnosis not present

## 2016-07-26 DIAGNOSIS — Z9221 Personal history of antineoplastic chemotherapy: Secondary | ICD-10-CM | POA: Diagnosis not present

## 2016-07-26 DIAGNOSIS — Z923 Personal history of irradiation: Secondary | ICD-10-CM | POA: Diagnosis not present

## 2016-07-29 DIAGNOSIS — C679 Malignant neoplasm of bladder, unspecified: Secondary | ICD-10-CM | POA: Diagnosis not present

## 2016-07-29 DIAGNOSIS — Z79899 Other long term (current) drug therapy: Secondary | ICD-10-CM | POA: Diagnosis not present

## 2016-07-29 DIAGNOSIS — Z9889 Other specified postprocedural states: Secondary | ICD-10-CM | POA: Diagnosis not present

## 2016-07-29 DIAGNOSIS — Z510 Encounter for antineoplastic radiation therapy: Secondary | ICD-10-CM | POA: Diagnosis not present

## 2016-07-29 DIAGNOSIS — C538 Malignant neoplasm of overlapping sites of cervix uteri: Secondary | ICD-10-CM | POA: Diagnosis not present

## 2016-07-29 DIAGNOSIS — Z923 Personal history of irradiation: Secondary | ICD-10-CM | POA: Diagnosis not present

## 2016-07-29 DIAGNOSIS — Z5111 Encounter for antineoplastic chemotherapy: Secondary | ICD-10-CM | POA: Diagnosis not present

## 2016-07-29 DIAGNOSIS — Z9221 Personal history of antineoplastic chemotherapy: Secondary | ICD-10-CM | POA: Diagnosis not present

## 2016-07-29 DIAGNOSIS — C539 Malignant neoplasm of cervix uteri, unspecified: Secondary | ICD-10-CM | POA: Diagnosis not present

## 2016-07-30 DIAGNOSIS — Z9889 Other specified postprocedural states: Secondary | ICD-10-CM | POA: Diagnosis not present

## 2016-07-30 DIAGNOSIS — Z510 Encounter for antineoplastic radiation therapy: Secondary | ICD-10-CM | POA: Diagnosis not present

## 2016-07-30 DIAGNOSIS — Z5111 Encounter for antineoplastic chemotherapy: Secondary | ICD-10-CM | POA: Diagnosis not present

## 2016-07-30 DIAGNOSIS — C538 Malignant neoplasm of overlapping sites of cervix uteri: Secondary | ICD-10-CM | POA: Diagnosis not present

## 2016-07-30 DIAGNOSIS — Z9221 Personal history of antineoplastic chemotherapy: Secondary | ICD-10-CM | POA: Diagnosis not present

## 2016-07-30 DIAGNOSIS — Z923 Personal history of irradiation: Secondary | ICD-10-CM | POA: Diagnosis not present

## 2016-07-30 DIAGNOSIS — C539 Malignant neoplasm of cervix uteri, unspecified: Secondary | ICD-10-CM | POA: Diagnosis not present

## 2016-07-30 DIAGNOSIS — Z79899 Other long term (current) drug therapy: Secondary | ICD-10-CM | POA: Diagnosis not present

## 2016-07-30 DIAGNOSIS — C679 Malignant neoplasm of bladder, unspecified: Secondary | ICD-10-CM | POA: Diagnosis not present

## 2016-07-31 DIAGNOSIS — Z923 Personal history of irradiation: Secondary | ICD-10-CM | POA: Diagnosis not present

## 2016-07-31 DIAGNOSIS — Z9889 Other specified postprocedural states: Secondary | ICD-10-CM | POA: Diagnosis not present

## 2016-07-31 DIAGNOSIS — Z5111 Encounter for antineoplastic chemotherapy: Secondary | ICD-10-CM | POA: Diagnosis not present

## 2016-07-31 DIAGNOSIS — Z510 Encounter for antineoplastic radiation therapy: Secondary | ICD-10-CM | POA: Diagnosis not present

## 2016-07-31 DIAGNOSIS — C538 Malignant neoplasm of overlapping sites of cervix uteri: Secondary | ICD-10-CM | POA: Diagnosis not present

## 2016-07-31 DIAGNOSIS — C679 Malignant neoplasm of bladder, unspecified: Secondary | ICD-10-CM | POA: Diagnosis not present

## 2016-07-31 DIAGNOSIS — Z79899 Other long term (current) drug therapy: Secondary | ICD-10-CM | POA: Diagnosis not present

## 2016-07-31 DIAGNOSIS — C539 Malignant neoplasm of cervix uteri, unspecified: Secondary | ICD-10-CM | POA: Diagnosis not present

## 2016-07-31 DIAGNOSIS — Z9221 Personal history of antineoplastic chemotherapy: Secondary | ICD-10-CM | POA: Diagnosis not present

## 2016-08-01 DIAGNOSIS — C519 Malignant neoplasm of vulva, unspecified: Secondary | ICD-10-CM | POA: Diagnosis not present

## 2016-08-01 DIAGNOSIS — C539 Malignant neoplasm of cervix uteri, unspecified: Secondary | ICD-10-CM | POA: Diagnosis not present

## 2016-08-01 DIAGNOSIS — Z510 Encounter for antineoplastic radiation therapy: Secondary | ICD-10-CM | POA: Diagnosis not present

## 2016-08-01 DIAGNOSIS — C538 Malignant neoplasm of overlapping sites of cervix uteri: Secondary | ICD-10-CM | POA: Diagnosis not present

## 2016-08-02 DIAGNOSIS — Z510 Encounter for antineoplastic radiation therapy: Secondary | ICD-10-CM | POA: Diagnosis not present

## 2016-08-02 DIAGNOSIS — C539 Malignant neoplasm of cervix uteri, unspecified: Secondary | ICD-10-CM | POA: Diagnosis not present

## 2016-08-02 DIAGNOSIS — C538 Malignant neoplasm of overlapping sites of cervix uteri: Secondary | ICD-10-CM | POA: Diagnosis not present

## 2016-08-02 DIAGNOSIS — C519 Malignant neoplasm of vulva, unspecified: Secondary | ICD-10-CM | POA: Diagnosis not present

## 2016-08-05 DIAGNOSIS — C539 Malignant neoplasm of cervix uteri, unspecified: Secondary | ICD-10-CM | POA: Diagnosis not present

## 2016-08-05 DIAGNOSIS — Z510 Encounter for antineoplastic radiation therapy: Secondary | ICD-10-CM | POA: Diagnosis not present

## 2016-08-05 DIAGNOSIS — C519 Malignant neoplasm of vulva, unspecified: Secondary | ICD-10-CM | POA: Diagnosis not present

## 2016-08-05 DIAGNOSIS — C538 Malignant neoplasm of overlapping sites of cervix uteri: Secondary | ICD-10-CM | POA: Diagnosis not present

## 2016-09-02 DIAGNOSIS — C530 Malignant neoplasm of endocervix: Secondary | ICD-10-CM | POA: Diagnosis not present

## 2016-09-02 DIAGNOSIS — Z923 Personal history of irradiation: Secondary | ICD-10-CM | POA: Diagnosis not present

## 2016-09-02 DIAGNOSIS — Z9889 Other specified postprocedural states: Secondary | ICD-10-CM | POA: Diagnosis not present

## 2016-09-02 DIAGNOSIS — Z510 Encounter for antineoplastic radiation therapy: Secondary | ICD-10-CM | POA: Diagnosis not present

## 2016-09-02 DIAGNOSIS — Z452 Encounter for adjustment and management of vascular access device: Secondary | ICD-10-CM | POA: Diagnosis not present

## 2016-09-02 DIAGNOSIS — C679 Malignant neoplasm of bladder, unspecified: Secondary | ICD-10-CM | POA: Diagnosis not present

## 2016-09-02 DIAGNOSIS — C539 Malignant neoplasm of cervix uteri, unspecified: Secondary | ICD-10-CM | POA: Diagnosis not present

## 2016-09-02 DIAGNOSIS — Z9221 Personal history of antineoplastic chemotherapy: Secondary | ICD-10-CM | POA: Diagnosis not present

## 2016-10-09 DIAGNOSIS — D649 Anemia, unspecified: Secondary | ICD-10-CM | POA: Diagnosis not present

## 2016-10-09 DIAGNOSIS — I890 Lymphedema, not elsewhere classified: Secondary | ICD-10-CM | POA: Diagnosis not present

## 2016-10-09 DIAGNOSIS — I10 Essential (primary) hypertension: Secondary | ICD-10-CM | POA: Diagnosis not present

## 2016-10-09 DIAGNOSIS — R32 Unspecified urinary incontinence: Secondary | ICD-10-CM | POA: Diagnosis not present

## 2016-10-09 DIAGNOSIS — Z9221 Personal history of antineoplastic chemotherapy: Secondary | ICD-10-CM | POA: Diagnosis not present

## 2016-10-09 DIAGNOSIS — Z79899 Other long term (current) drug therapy: Secondary | ICD-10-CM | POA: Diagnosis not present

## 2016-10-09 DIAGNOSIS — C519 Malignant neoplasm of vulva, unspecified: Secondary | ICD-10-CM | POA: Diagnosis not present

## 2016-10-09 DIAGNOSIS — Z882 Allergy status to sulfonamides status: Secondary | ICD-10-CM | POA: Diagnosis not present

## 2016-10-09 DIAGNOSIS — Z808 Family history of malignant neoplasm of other organs or systems: Secondary | ICD-10-CM | POA: Diagnosis not present

## 2016-10-17 DIAGNOSIS — Z8551 Personal history of malignant neoplasm of bladder: Secondary | ICD-10-CM | POA: Diagnosis not present

## 2016-10-24 DIAGNOSIS — N858 Other specified noninflammatory disorders of uterus: Secondary | ICD-10-CM | POA: Diagnosis not present

## 2016-10-24 DIAGNOSIS — D649 Anemia, unspecified: Secondary | ICD-10-CM | POA: Diagnosis not present

## 2016-10-24 DIAGNOSIS — I10 Essential (primary) hypertension: Secondary | ICD-10-CM | POA: Diagnosis not present

## 2016-10-24 DIAGNOSIS — N898 Other specified noninflammatory disorders of vagina: Secondary | ICD-10-CM | POA: Diagnosis not present

## 2016-10-24 DIAGNOSIS — Z96642 Presence of left artificial hip joint: Secondary | ICD-10-CM | POA: Diagnosis not present

## 2016-10-24 DIAGNOSIS — M79672 Pain in left foot: Secondary | ICD-10-CM | POA: Diagnosis not present

## 2016-10-24 DIAGNOSIS — M79671 Pain in right foot: Secondary | ICD-10-CM | POA: Diagnosis not present

## 2016-10-24 DIAGNOSIS — C539 Malignant neoplasm of cervix uteri, unspecified: Secondary | ICD-10-CM | POA: Diagnosis not present

## 2016-10-24 DIAGNOSIS — G588 Other specified mononeuropathies: Secondary | ICD-10-CM | POA: Diagnosis not present

## 2016-10-25 DIAGNOSIS — R32 Unspecified urinary incontinence: Secondary | ICD-10-CM | POA: Diagnosis not present

## 2016-10-25 DIAGNOSIS — Z808 Family history of malignant neoplasm of other organs or systems: Secondary | ICD-10-CM | POA: Diagnosis not present

## 2016-10-25 DIAGNOSIS — Z79899 Other long term (current) drug therapy: Secondary | ICD-10-CM | POA: Diagnosis not present

## 2016-10-25 DIAGNOSIS — C519 Malignant neoplasm of vulva, unspecified: Secondary | ICD-10-CM | POA: Diagnosis not present

## 2016-10-25 DIAGNOSIS — D649 Anemia, unspecified: Secondary | ICD-10-CM | POA: Diagnosis not present

## 2016-10-25 DIAGNOSIS — Z882 Allergy status to sulfonamides status: Secondary | ICD-10-CM | POA: Diagnosis not present

## 2016-10-25 DIAGNOSIS — I10 Essential (primary) hypertension: Secondary | ICD-10-CM | POA: Diagnosis not present

## 2016-10-25 DIAGNOSIS — Z9221 Personal history of antineoplastic chemotherapy: Secondary | ICD-10-CM | POA: Diagnosis not present

## 2016-10-25 DIAGNOSIS — I890 Lymphedema, not elsewhere classified: Secondary | ICD-10-CM | POA: Diagnosis not present

## 2016-11-04 DIAGNOSIS — R32 Unspecified urinary incontinence: Secondary | ICD-10-CM | POA: Diagnosis not present

## 2016-11-04 DIAGNOSIS — C519 Malignant neoplasm of vulva, unspecified: Secondary | ICD-10-CM | POA: Diagnosis not present

## 2016-11-04 DIAGNOSIS — I890 Lymphedema, not elsewhere classified: Secondary | ICD-10-CM | POA: Diagnosis not present

## 2016-11-04 DIAGNOSIS — Z808 Family history of malignant neoplasm of other organs or systems: Secondary | ICD-10-CM | POA: Diagnosis not present

## 2016-11-04 DIAGNOSIS — Z79899 Other long term (current) drug therapy: Secondary | ICD-10-CM | POA: Diagnosis not present

## 2016-11-04 DIAGNOSIS — Z9221 Personal history of antineoplastic chemotherapy: Secondary | ICD-10-CM | POA: Diagnosis not present

## 2016-11-04 DIAGNOSIS — Z882 Allergy status to sulfonamides status: Secondary | ICD-10-CM | POA: Diagnosis not present

## 2016-11-04 DIAGNOSIS — D649 Anemia, unspecified: Secondary | ICD-10-CM | POA: Diagnosis not present

## 2016-11-04 DIAGNOSIS — I10 Essential (primary) hypertension: Secondary | ICD-10-CM | POA: Diagnosis not present

## 2016-11-08 ENCOUNTER — Ambulatory Visit: Payer: Commercial Managed Care - HMO | Attending: Obstetrics and Gynecology | Admitting: Physical Therapy

## 2016-11-08 DIAGNOSIS — R279 Unspecified lack of coordination: Secondary | ICD-10-CM | POA: Insufficient documentation

## 2016-11-08 DIAGNOSIS — M79661 Pain in right lower leg: Secondary | ICD-10-CM | POA: Insufficient documentation

## 2016-11-08 DIAGNOSIS — R29898 Other symptoms and signs involving the musculoskeletal system: Secondary | ICD-10-CM | POA: Insufficient documentation

## 2016-11-08 DIAGNOSIS — R2689 Other abnormalities of gait and mobility: Secondary | ICD-10-CM | POA: Insufficient documentation

## 2016-11-08 DIAGNOSIS — Z483 Aftercare following surgery for neoplasm: Secondary | ICD-10-CM | POA: Insufficient documentation

## 2016-11-08 DIAGNOSIS — M79662 Pain in left lower leg: Secondary | ICD-10-CM | POA: Diagnosis present

## 2016-11-08 DIAGNOSIS — M6281 Muscle weakness (generalized): Secondary | ICD-10-CM | POA: Diagnosis present

## 2016-11-08 DIAGNOSIS — R269 Unspecified abnormalities of gait and mobility: Secondary | ICD-10-CM | POA: Insufficient documentation

## 2016-11-08 NOTE — Therapy (Signed)
[facility] [address], [place], [place], [address] Phone: [phone]   Fax:  [phone]  Physical Therapy Evaluation  Patient Details  Name: Sherri Mendez MRN: [id number] Date of Birth: 1935-12-28 Referring Provider: Dr. Quentin Cornwall Jump   Encounter Date: 11/08/2016      PT End of Session - 11/08/16 1216    Visit Number 1   Number of Visits 10   Date for PT Re-Evaluation 01/03/17   Authorization Type Medicare G codes;  KX at visit 11 (patient had 3 visits for Lymphedema in [place])   PT Start Time 1015   PT Stop Time 1100   PT Time Calculation (min) 45 min   Activity Tolerance Patient tolerated treatment well      Past Medical History:  Diagnosis Date  . Cervical cancer [facility]) dx 01-31-2016--  oncologist-  dr livesay/  dr Sondra Come   FIGO Stage IIB, Invasive poorly differeniated sqaumous cell carcinoma, involiving bilateral external iliac nodes mets/  current treatment chemotherapy and radiation therapy  . History of bladder cancer urologist-  dr Karsten Ro   per path. 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 History of rib fracture    left side  . Hypertension   . Iron deficiency anemia   . Lower urinary tract symptoms (LUTS)   . Osteopenia   . Wears glasses     Past Surgical History:  Procedure Laterality Date  . CATARACT EXTRACTION W/ INTRAOCULAR LENS IMPLANT Right 10/2015  . COLONOSCOPY  2016  . CYSTOSCOPY WITH BIOPSY N/A 02/16/2016   Procedure: CYSTOSCOPY WITH  BLADDER BIOPSY;  Surgeon: [name], MD;  Location: [facility];  Service: Urology;  Laterality: N/A;  . ORIF RIGHT ANKLE FX  2006   retined hardware  . TANDEM RING INSERTION N/A 04/11/2016   Procedure: TANDEM RING INSERTION;  Surgeon: Gery Pray, MD;  Location: [facility];  Service: Urology;  Laterality: N/A;  . TOTAL HIP ARTHROPLASTY Left 12/28/2014   Procedure: TOTAL HIP ARTHROPLASTY ANTERIOR APPROACH;  Surgeon: Leandrew Koyanagi, MD;  Location: [facility];  Service: Orthopedics;  Laterality: Left;  femur fracture    There were no vitals filed for this visit.       Subjective Assessment - 11/08/16 1019    Subjective Diagnosed with cervical cancer last year and as a result of treatment she has neuropathy in both feet with right worse than left.  Bilateral lower leg symptoms began in March.   Unable to drive.  Now using a rolling walker full-time.  Patient complains of being very unstable.  When I get out of bed, my legs feel like they are on fire.     Patient is accompained by: Family member  daughter   Pertinent History cervical CA 2017 had radiation and chemo (PET scan on 21st);  neuropathy and fecal/urinary incontinence;  left hip fx 2016;  osteopenia;  had 3 PT treatments in [place] for lymphedema   Limitations House hold activities;Walking   How long can you sit comfortably? less pain with sitting   How long can you walk comfortably? with walker (all over the house)    Diagnostic tests PET scans   Patient Stated Goals walk unassisted   Currently in Pain? Yes   Pain Score 6    Pain Location Leg   Pain Orientation Right;Left;Lower   Pain Type Acute pain   Pain Radiating Towards right foot to thigh, left  to the knee   Pain Onset More than a month ago   Pain Frequency Constant   Aggravating Factors  walking   Pain Relieving Factors getting off feet; sitting with feet on ottoman            [facility] PT Assessment - 11/08/16 0001      Assessment   Medical Diagnosis gait instability   Referring Provider Dr. Quentin Cornwall Jump    Onset Date/Surgical Date --  March   Next MD Visit End of the month   Prior Therapy after hip fx     Precautions   Precautions Fall     Restrictions   Weight Bearing Restrictions No     Balance Screen   Has the patient fallen in the past 6 months Yes   How many times? 1x   Has the patient had a  decrease in activity level because of a fear of falling?  Yes   Is the patient reluctant to leave their home because of a fear of falling?  Yes     [place] Private residence   Living Arrangements Alone   Available Help at Discharge Family;Friend(s)  neighbor   Home Access Stairs to enter   Entrance Stairs-Number of Steps 2   [place] One level   [place] - 2 wheels;Shower seat   Additional Comments squatty potty     Prior Function   Vocation Retired   Leisure reading, play cards, go out to eat; go to [employer] (flight of stairs into [employer])     Posture/Postural Control   Posture/Postural Control No significant limitations     AROM   Right/Left Shoulder --  UE ROM WNLs   Right Ankle Dorsiflexion 0   Right Ankle Plantar Flexion 25   Right Ankle Inversion 0   Right Ankle Eversion 0   Left Ankle Dorsiflexion 3   Left Ankle Plantar Flexion 40   Left Ankle Inversion 5   Left Ankle Eversion 5     Strength   Right/Left Hip Right;Left   Right Hip Flexion 3/5   Right Hip Extension 3/5   Right Hip ABduction 3/5   Right Hip ADduction 3/5   Left Hip Flexion 4-/5   Left Hip Extension 3+/5   Left Hip ABduction 3+/5   Left Hip ADduction 4-/5   Right/Left Knee Right;Left   Right Knee Flexion 3/5   Right Knee Extension 3/5   Left Knee Flexion 3/5   Left Knee Extension 3/5   Right/Left Ankle Right;Left   Right Ankle Dorsiflexion 0/5   Right Ankle Plantar Flexion 0/5   Right Ankle Inversion 0/5   Right Ankle Eversion 0/5   Left Ankle Dorsiflexion 3-/5   Left Ankle Plantar Flexion 3-/5   Left Ankle Inversion 3-/5   Left Ankle Eversion 3-/5   Lumbar Flexion 3-/5   Lumbar Extension 3-/5     Flexibility   Soft Tissue Assessment /Muscle Length yes   Hamstrings decreased lengths bilaterally  decreased gastroc lengths bilaterally     Ambulation/Gait   Ambulation Distance (Feet) 100 Feet   Assistive device 4-wheeled walker    Gait Pattern Poor foot clearance - right     Berg Balance Test   Sit to Stand Able to stand  independently using hands   Standing Unsupported Able to stand 2 minutes with supervision   Sitting with Back Unsupported but Feet Supported on Floor or Stool Able to sit safely and  securely 2 minutes   Stand to Sit Uses backs of legs against chair to control descent   Transfers Able to transfer safely, definite need of hands   Standing Unsupported with Eyes Closed Needs help to keep from falling   Standing Ubsupported with Feet Together Needs help to attain position and unable to hold for 15 seconds   From Standing, Reach Forward with Outstretched Arm Reaches forward but needs supervision   From Standing Position, Pick up Object from Floor Unable to try/needs assist to keep balance   From Standing Position, Turn to Look Behind Over each Shoulder Needs supervision when turning   Turn 360 Degrees Needs assistance while turning   Standing Unsupported, Alternately Place Feet on Step/Stool Needs assistance to keep from falling or unable to try   Standing Unsupported, One Foot in Front Loses balance while stepping or standing   Standing on One Leg Unable to try or needs assist to prevent fall   Total Score 17     Timed Up and Go Test   TUG Comments to be done next visit                             PT Short Term Goals - 11/08/16 1230      PT SHORT TERM GOAL #1   Title be independent in initial HEP for LE strengthening 12/06/16   Time 4   Period Weeks   Status New     PT SHORT TERM GOAL #2   Title The patient will be able to maintain standing balance with min perturbances (turning head, reaching) without loss of balance   Time 4   Period Weeks   Status New     PT SHORT TERM GOAL #3   Title The patient will express understanding of fall risk and strategies to reduce risk of falling (lighting, remove throw rugs, continued full-time use of RW)   Time 4   Period Weeks    Status New           PT Long Term Goals - 11/08/16 1234      PT LONG TERM GOAL #1   Title be independent in progressive HEP for further improvements in ROM, strength and balance    01/03/17   Time 8   Status New     PT LONG TERM GOAL #2   Title The patient will have improved core, hip and knee strength to grossly 4-/5 needed for stability with walking, 2 steps to enter home, sit to stand   Time 8   Period Weeks   Status New     PT LONG TERM GOAL #3   Title The patient will have improved BERG score to 26/56 indicating improved balance and reduced risk of falls   Time 8   Period Weeks   Status New     PT LONG TERM GOAL #4   Title perform community ambulation, going to restaurants, play cards at friends homes with 25% less discomfort in her legs   Time 8   Period Weeks   Status New               Plan - 11/08/16 1218    Clinical Impression Statement The patient was diagnosed with cervical cancer last year and completed 2 rounds of radiation and chemo.  In March, she began having bilateral lower leg numbness,tingling and pain which progressively worsened and led to motor loss right > left.  She was diagnosed with neuropathy as a result of her cancer treatments.  She has right drop foot and ambulates with a 4-wheeled rolling walker on a full time basis.  Her lower leg pain is worsened with walking/standing and somewhat better with being off her feet but constant.  The patient is very high risk for falls 100% because of distal motor loss as well as proximal weakness in hips and core.  The patient also has urinary and fecal incontinence and will be further assessed at a later time by a pelvic floor PT specialist.  The patient has also received PT treatment for LE lymphedema at another facility and is independently managing at this time.  The patient is of moderate complexity evaluation secondary to numerous comorbidities including cancer and the multi body systems affected and an  evolving status.  She does not have home support but has family and friends near by.     Rehab Potential Good   Clinical Impairments Affecting Rehab Potential risk of falls, CA no U/S;  lymphedema LE;  hip fx 2016   PT Frequency 2x / week   PT Duration 8 weeks   PT Treatment/Interventions ADLs/Self Care Home Management;Cryotherapy;Electrical Stimulation;Gait training;Neuromuscular re-education;Therapeutic exercise;Therapeutic activities;Balance training;Patient/family education;Manual techniques   PT Next Visit Plan Nu-Step;  do Timed up and Go test; initiate HS and gastroc stretching;  seated ankle rocker board bilateral; low level core, hip and knee strengthening   Recommended Other Services if right foot motor control does not return may need AFO       Patient will benefit from skilled therapeutic intervention in order to improve the following deficits and impairments:  Pain, Decreased activity tolerance, Abnormal gait, Decreased balance, Decreased strength, Decreased range of motion, Difficulty walking  Visit Diagnosis: Muscle weakness (generalized)  Other abnormalities of gait and mobility  Pain in left lower leg  Pain in right lower leg      G-Codes - 12/05/2016 1242    Functional Assessment Tool Used (Outpatient Only) Clinical judgement; BERG   Functional Limitation Mobility: Walking and moving around   Mobility: Walking and Moving Around Current Status ([id number]) At least 60 percent but less than 80 percent impaired, limited or restricted   Mobility: Walking and Moving Around Goal Status [phone]) At least 40 percent but less than 60 percent impaired, limited or restricted       Problem List Patient Active Problem List   Diagnosis Date Noted  . Malignant neoplasm of endocervix ([place])   . Malignant neoplasm of lateral wall of urinary bladder ([place]) 03/16/2016  . Extravasation, infusion or chemotherapeutic agent 03/13/2016  . Postmenopausal vaginal bleeding 02/28/2016  . Other  iron deficiency anemias 02/28/2016  . Malignant neoplasm of exocervix ([place]) 01/31/2016  . Fracture of femoral neck, left ([place]) 12/28/2014  . Leukocytosis   . Elevated blood pressure    Ruben Im, PT 12/05/16 12:48 PM Phone: [phone] Fax: [phone]  Alvera Singh 12-05-16, 12:47 PM  [facility] [address] [place], [place], [address] Phone: [phone]   Fax:  [phone]  Name: PRINCELLA JASKIEWICZ MRN: [id number] Date of Birth: 03/02/1936

## 2016-11-12 ENCOUNTER — Ambulatory Visit: Payer: Commercial Managed Care - HMO | Admitting: Physical Therapy

## 2016-11-12 ENCOUNTER — Encounter: Payer: Self-pay | Admitting: Physical Therapy

## 2016-11-12 DIAGNOSIS — R29898 Other symptoms and signs involving the musculoskeletal system: Secondary | ICD-10-CM

## 2016-11-12 DIAGNOSIS — M6281 Muscle weakness (generalized): Secondary | ICD-10-CM

## 2016-11-12 DIAGNOSIS — M79662 Pain in left lower leg: Secondary | ICD-10-CM

## 2016-11-12 DIAGNOSIS — R269 Unspecified abnormalities of gait and mobility: Secondary | ICD-10-CM

## 2016-11-12 DIAGNOSIS — M79661 Pain in right lower leg: Secondary | ICD-10-CM

## 2016-11-12 DIAGNOSIS — R2689 Other abnormalities of gait and mobility: Secondary | ICD-10-CM

## 2016-11-12 NOTE — Therapy (Signed)
[facility] [address] [place], [place], [address] Phone: [phone]   Fax:  [phone]  Physical Therapy Treatment  Patient Details  Name: Sherri Mendez MRN: [id number] Date of Birth: April 05, 1936 Referring Provider: Dr. Quentin Cornwall Jump   Encounter Date: 11/12/2016      PT End of Session - 11/12/16 1241    Visit Number 2   Number of Visits 10   Date for PT Re-Evaluation 01/03/17   Authorization Type Medicare G codes;  KX at visit 11 (patient had 3 visits for Lymphedema in [place])   PT Start Time 1236   PT Stop Time 1320   PT Time Calculation (min) 44 min   Activity Tolerance Patient tolerated treatment well   Behavior During Therapy [facility] for tasks assessed/performed      Past Medical History:  Diagnosis Date  . Cervical cancer [facility]) dx 01-31-2016--  oncologist-  dr livesay/  dr Sondra Come   FIGO Stage IIB, Invasive poorly differeniated sqaumous cell carcinoma, involiving bilateral external iliac nodes mets/  current treatment chemotherapy and radiation therapy  . History of bladder cancer urologist-  dr Karsten Ro   per path. 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 History of rib fracture    left side  . Hypertension   . Iron deficiency anemia   . Lower urinary tract symptoms (LUTS)   . Osteopenia   . Wears glasses     Past Surgical History:  Procedure Laterality Date  . CATARACT EXTRACTION W/ INTRAOCULAR LENS IMPLANT Right 10/2015  . COLONOSCOPY  2016  . CYSTOSCOPY WITH BIOPSY N/A 02/16/2016   Procedure: CYSTOSCOPY WITH  BLADDER BIOPSY;  Surgeon: [name], MD;  Location: [facility];  Service: Urology;  Laterality: N/A;  . ORIF RIGHT ANKLE FX  2006   retined hardware  . TANDEM RING INSERTION N/A 04/11/2016   Procedure: TANDEM RING INSERTION;  Surgeon: Gery Pray, MD;  Location: [facility];  Service: Urology;  Laterality:  N/A;  . TOTAL HIP ARTHROPLASTY Left 12/28/2014   Procedure: TOTAL HIP ARTHROPLASTY ANTERIOR APPROACH;  Surgeon: Leandrew Koyanagi, MD;  Location: [facility];  Service: Orthopedics;  Laterality: Left;  femur fracture    There were no vitals filed for this visit.      Subjective Assessment - 11/12/16 1240    Subjective Patient reports painful neuropathy in feet.    Patient is accompained by: Family member   Pertinent History cervical CA 2017 had radiation and chemo (PET scan on 21st);  neuropathy and fecal/urinary incontinence;  left hip fx 2016;  osteopenia;  had 3 PT treatments in [place] for lymphedema   Limitations House hold activities;Walking   How long can you sit comfortably? less pain with sitting   How long can you walk comfortably? with walker (all over the house)    Diagnostic tests PET scans   Patient Stated Goals walk unassisted   Currently in Pain? Yes   Pain Score 7    Pain Location Leg   Pain Orientation Right;Left;Lower   Pain Type Acute pain   Pain Radiating Towards right foot to thigh, left to the knee   Pain Onset More than a month ago   Pain Frequency Constant   Aggravating Factors  walking   Pain Relieving Factors getting off feet; sitting with feet on ottoman  [place] [facility] PT Treatment/Exercise - 11/12/16 0001      Neuro Re-ed    Neuro Re-ed Details  Activation of core, gluteals, and pelvic floor muscles     Lumbar Exercises: Supine   Glut Set 20 reps  With ball squeeze   Bent Knee Raise 20 reps   Bridge 10 reps  With ball squeeze   Large Ball Abdominal Isometric 15 reps     Lumbar Exercises: Sidelying   Clam 10 reps     Knee/Hip Exercises: Standing   Heel Raises Both;1 set;10 reps  Unable   Knee Flexion AROM;Both;2 sets;10 reps   Hip Abduction AROM;Both;2 sets;10 reps   Hip Extension --     Knee/Hip Exercises: Seated   Sit to Sand 1 set;10 reps                PT Education - 11/12/16 1320     Education provided Yes   Education Details Seated and supine strenghtening exercises   Person(s) Educated Patient   Methods Explanation;Handout   Comprehension Verbalized understanding          PT Short Term Goals - 11/08/16 1230      PT SHORT TERM GOAL #1   Title be independent in initial HEP for LE strengthening 12/06/16   Time 4   Period Weeks   Status New     PT SHORT TERM GOAL #2   Title The patient will be able to maintain standing balance with min perturbances (turning head, reaching) without loss of balance   Time 4   Period Weeks   Status New     PT SHORT TERM GOAL #3   Title The patient will express understanding of fall risk and strategies to reduce risk of falling (lighting, remove throw rugs, continued full-time use of RW)   Time 4   Period Weeks   Status New           PT Long Term Goals - 11/08/16 1234      PT LONG TERM GOAL #1   Title be independent in progressive HEP for further improvements in ROM, strength and balance    01/03/17   Time 8   Status New     PT LONG TERM GOAL #2   Title The patient will have improved core, hip and knee strength to grossly 4-/5 needed for stability with walking, 2 steps to enter home, sit to stand   Time 8   Period Weeks   Status New     PT LONG TERM GOAL #3   Title The patient will have improved BERG score to 26/56 indicating improved balance and reduced risk of falls   Time 8   Period Weeks   Status New     PT LONG TERM GOAL #4   Title perform community ambulation, going to restaurants, play cards at friends homes with 25% less discomfort in her legs   Time 8   Period Weeks   Status New               Plan - 11/12/16 1325    Clinical Impression Statement Patient presents with neuropathy and weakness in Bil LE. Patient did well with all supine and seated strengthening. Patient has difficulty with all standing exercises secondary to neuropathy and weakness. Patient will continue to benefit from skilled  thearpy for LE and core stability.    Rehab Potential Good   Clinical Impairments Affecting Rehab Potential risk of falls, CA no U/S;  lymphedema  LE;  hip fx 2016   PT Frequency 2x / week   PT Duration 8 weeks   PT Treatment/Interventions ADLs/Self Care Home Management;Cryotherapy;Electrical Stimulation;Gait training;Neuromuscular re-education;Therapeutic exercise;Therapeutic activities;Balance training;Patient/family education;Manual techniques   PT Next Visit Plan Nu-Step;  do Timed up and Go test; initiate HS and gastroc stretching;  seated ankle rocker board bilateral; low level core, hip and knee strengthening   Consulted and Agree with Plan of Care Patient      Patient will benefit from skilled therapeutic intervention in order to improve the following deficits and impairments:  Pain, Decreased activity tolerance, Abnormal gait, Decreased balance, Decreased strength, Decreased range of motion, Difficulty walking  Visit Diagnosis: Muscle weakness (generalized)  Other abnormalities of gait and mobility  Pain in left lower leg  Pain in right lower leg  Weakness of left hip  Abnormality of gait     Problem List Patient Active Problem List   Diagnosis Date Noted  . Malignant neoplasm of endocervix ([place])   . Malignant neoplasm of lateral wall of urinary bladder ([place]) 03/16/2016  . Extravasation, infusion or chemotherapeutic agent 03/13/2016  . Postmenopausal vaginal bleeding 02/28/2016  . Other iron deficiency anemias 02/28/2016  . Malignant neoplasm of exocervix ([place]) 01/31/2016  . Fracture of femoral neck, left ([place]) 12/28/2014  . Leukocytosis   . Elevated blood pressure     Mikle Bosworth PTA 11/12/2016, 1:30 PM  [facility] [address], [place], [place], [address] Phone: [phone]   Fax:  [phone]  Name: KYLIANA STANDEN MRN: [id number] Date of Birth: 1935/10/20

## 2016-11-12 NOTE — Patient Instructions (Signed)
Adduction: Hip - Knees Together (Hook-Lying)    Lie with hips and knees bent, towel roll between knees. Tighten glutes and push knees together. Hold for _3__ seconds. Rest for _3__ seconds. Repeat _10__ times. Do _1__ times a day.   Copyright  VHI. All rights reserved.   Clam Shell 45 Degrees    Lying with hips and knees bent 45, one pillow between knees and ankles. Lift knee. Be sure pelvis does not roll backward. Do not arch back. Do _2__ times, each leg, _1__ times per day.  http://ss.exer.us/75   Copyright  VHI. All rights reserved.   KNEE: Extension, Long Arc Quads - Sitting    Raise leg until knee is straight. _10__ reps per set, _2__ sets per day, __3_ days per week  Copyright  VHI. All rights reserved.   FLEXION: Sitting (Active)    Sit, both feet flat. Lift right knee toward ceiling. Use __0_ lbs. Complete __2_ sets of _10__ repetitions. Perform _1__ sessions per day.  Copyright  VHI. All rights reserved.   Mikle Bosworth, PTA 11/12/16 1:20 PM  [facility] [address], [place], [place] [address] Phone # [phone] Fax [phone]

## 2016-11-15 ENCOUNTER — Encounter: Payer: Self-pay | Admitting: Physical Therapy

## 2016-11-15 ENCOUNTER — Ambulatory Visit: Payer: Commercial Managed Care - HMO | Admitting: Physical Therapy

## 2016-11-15 DIAGNOSIS — M79662 Pain in left lower leg: Secondary | ICD-10-CM

## 2016-11-15 DIAGNOSIS — M6281 Muscle weakness (generalized): Secondary | ICD-10-CM

## 2016-11-15 DIAGNOSIS — R29898 Other symptoms and signs involving the musculoskeletal system: Secondary | ICD-10-CM

## 2016-11-15 DIAGNOSIS — R2689 Other abnormalities of gait and mobility: Secondary | ICD-10-CM

## 2016-11-15 DIAGNOSIS — R269 Unspecified abnormalities of gait and mobility: Secondary | ICD-10-CM

## 2016-11-15 DIAGNOSIS — M79661 Pain in right lower leg: Secondary | ICD-10-CM

## 2016-11-15 NOTE — Therapy (Signed)
[facility] [address], [place], [place], [address] Phone: [phone]   Fax:  [phone]  Physical Therapy Treatment  Patient Details  Name: Sherri Mendez MRN: [id number] Date of Birth: [birth year] Referring Provider: Dr. Quentin Cornwall Jump   Encounter Date: 11/15/2016      PT End of Session - 11/15/16 0930    Visit Number 3   Number of Visits 10   Date for PT Re-Evaluation 01/03/17   Authorization Type Medicare G codes;  KX at visit 11 (patient had 3 visits for Lymphedema in [place])   PT Start Time [phone]   PT Stop Time 1012   PT Time Calculation (min) 44 min   Activity Tolerance Patient tolerated treatment well   Behavior During Therapy [facility] for tasks assessed/performed      Past Medical History:  Diagnosis Date  . Cervical cancer [facility]) dx 01-31-2016--  oncologist-  dr livesay/  dr Sondra Come   FIGO Stage IIB, Invasive poorly differeniated sqaumous cell carcinoma, involiving bilateral external iliac nodes mets/  current treatment chemotherapy and radiation therapy  . History of bladder cancer urologist-  dr Karsten Ro   per path. 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 History of rib fracture    left side  . Hypertension   . Iron deficiency anemia   . Lower urinary tract symptoms (LUTS)   . Osteopenia   . Wears glasses     Past Surgical History:  Procedure Laterality Date  . CATARACT EXTRACTION W/ INTRAOCULAR LENS IMPLANT Right 10/2015  . COLONOSCOPY  2016  . CYSTOSCOPY WITH BIOPSY N/A 02/16/2016   Procedure: CYSTOSCOPY WITH  BLADDER BIOPSY;  Surgeon: [name], MD;  Location: [facility];  Service: Urology;  Laterality: N/A;  . ORIF RIGHT ANKLE FX  2006   retined hardware  . TANDEM RING INSERTION N/A 04/11/2016   Procedure: TANDEM RING INSERTION;  Surgeon: Gery Pray, MD;  Location: [facility];  Service: Urology;  Laterality:  N/A;  . TOTAL HIP ARTHROPLASTY Left 12/28/2014   Procedure: TOTAL HIP ARTHROPLASTY ANTERIOR APPROACH;  Surgeon: Leandrew Koyanagi, MD;  Location: [place];  Service: Orthopedics;  Laterality: Left;  femur fracture    There were no vitals filed for this visit.                       [facility] Adult PT Treatment/Exercise - 11/15/16 0001      Knee/Hip Exercises: Standing   Knee Flexion AROM;Both;2 sets;10 reps   Hip Abduction AROM;Both;2 sets;10 reps   Other Standing Knee Exercises Weight shifting 3 direction     Knee/Hip Exercises: Seated   Long Arc Quad Strengthening;Both;2 sets;10 reps  #3   Heel Slides AROM  Heel/ Toe raises   Ball Squeeze x20 with core and glute activation   Clamshell with TheraBand Red     Modalities   Modalities Electrical Stimulation;Moist Heat     Moist Heat Therapy   Number Minutes Moist Heat 10 Minutes   Moist Heat Location Ankle  Bil feet     Electrical Stimulation   Electrical Stimulation Location Lt tib anterior, Lt gastroc   Electrical Stimulation Action Pre mod   Electrical Stimulation Parameters 20 minutes 30 ma  concurrent with standing exercises   Electrical Stimulation Goals Pain                  PT Short Term Goals -  11/08/16 1230      PT SHORT TERM GOAL #1   Title be independent in initial HEP for LE strengthening 12/06/16   Time 4   Period Weeks   Status New     PT SHORT TERM GOAL #2   Title The patient will be able to maintain standing balance with min perturbances (turning head, reaching) without loss of balance   Time 4   Period Weeks   Status New     PT SHORT TERM GOAL #3   Title The patient will express understanding of fall risk and strategies to reduce risk of falling (lighting, remove throw rugs, continued full-time use of RW)   Time 4   Period Weeks   Status New           PT Long Term Goals - 11/08/16 1234      PT LONG TERM GOAL #1   Title be independent in progressive HEP for further  improvements in ROM, strength and balance    01/03/17   Time 8   Status New     PT LONG TERM GOAL #2   Title The patient will have improved core, hip and knee strength to grossly 4-/5 needed for stability with walking, 2 steps to enter home, sit to stand   Time 8   Period Weeks   Status New     PT LONG TERM GOAL #3   Title The patient will have improved BERG score to 26/56 indicating improved balance and reduced risk of falls   Time 8   Period Weeks   Status New     PT LONG TERM GOAL #4   Title perform community ambulation, going to restaurants, play cards at friends homes with 25% less discomfort in her legs   Time 8   Period Weeks   Status New               Plan - 11/15/16 1007    Clinical Impression Statement Patient with intense pain in Bil LE which limits ability to tolerate standing exercises. Patient did well with all seated exercises. Used Estim and heat during standing exercises to help decrease pain. Pt responded well to heat and Estim. Patient will continue to benefit from skilled therapy for LE and core strengthening as well as management of pain symptoms.    Rehab Potential Good   Clinical Impairments Affecting Rehab Potential risk of falls, CA no U/S;  lymphedema LE;  hip fx 2016   PT Frequency 2x / week   PT Duration 8 weeks   PT Treatment/Interventions ADLs/Self Care Home Management;Cryotherapy;Electrical Stimulation;Gait training;Neuromuscular re-education;Therapeutic exercise;Therapeutic activities;Balance training;Patient/family education;Manual techniques   PT Next Visit Plan Estim with exercises, Nustep   Consulted and Agree with Plan of Care Patient      Patient will benefit from skilled therapeutic intervention in order to improve the following deficits and impairments:  Pain, Decreased activity tolerance, Abnormal gait, Decreased balance, Decreased strength, Decreased range of motion, Difficulty walking  Visit Diagnosis: Muscle weakness  (generalized)  Other abnormalities of gait and mobility  Pain in left lower leg  Pain in right lower leg  Weakness of left hip  Abnormality of gait     Problem List Patient Active Problem List   Diagnosis Date Noted  . Malignant neoplasm of endocervix ([place])   . Malignant neoplasm of lateral wall of urinary bladder ([place]) 03/16/2016  . Extravasation, infusion or chemotherapeutic agent 03/13/2016  . Postmenopausal vaginal bleeding 02/28/2016  . Other iron  deficiency anemias 02/28/2016  . Malignant neoplasm of exocervix ([place]) 01/31/2016  . Fracture of femoral neck, left ([place]) 12/28/2014  . Leukocytosis   . Elevated blood pressure     Jeanie Sewer PTA 11/15/2016, 10:24 AM  [facility] [address] [place], [place], [address] Phone: [phone]   Fax:  [phone]  Name: DERIKA ECKLES MRN: [id number] Date of Birth: 12-16-1935

## 2016-11-19 ENCOUNTER — Ambulatory Visit: Payer: Commercial Managed Care - HMO | Admitting: Physical Therapy

## 2016-11-19 ENCOUNTER — Encounter: Payer: Self-pay | Admitting: Physical Therapy

## 2016-11-19 DIAGNOSIS — R29898 Other symptoms and signs involving the musculoskeletal system: Secondary | ICD-10-CM

## 2016-11-19 DIAGNOSIS — M6281 Muscle weakness (generalized): Secondary | ICD-10-CM

## 2016-11-19 DIAGNOSIS — M79662 Pain in left lower leg: Secondary | ICD-10-CM

## 2016-11-19 DIAGNOSIS — R269 Unspecified abnormalities of gait and mobility: Secondary | ICD-10-CM

## 2016-11-19 DIAGNOSIS — R2689 Other abnormalities of gait and mobility: Secondary | ICD-10-CM

## 2016-11-19 DIAGNOSIS — M79661 Pain in right lower leg: Secondary | ICD-10-CM

## 2016-11-19 NOTE — Therapy (Signed)
[facility] [address] [place], [place], [address] Phone: [phone]   Fax:  [phone]  Physical Therapy Treatment  Patient Details  Name: Sherri Mendez MRN: [id number] Date of Birth: Jul 02, 1935 Referring Provider: Dr. Quentin Cornwall Jump   Encounter Date: 11/19/2016      PT End of Session - 11/19/16 1239    Visit Number 4   Number of Visits 10   Date for PT Re-Evaluation 01/03/17   Authorization Type Medicare G codes;  KX at visit 11 (patient had 3 visits for Lymphedema in [place])   PT Start Time 1230   PT Stop Time 1314   PT Time Calculation (min) 44 min   Activity Tolerance Patient tolerated treatment well   Behavior During Therapy [facility] for tasks assessed/performed      Past Medical History:  Diagnosis Date  . Cervical cancer [facility]) dx 01-31-2016--  oncologist-  dr livesay/  dr Sondra Come   FIGO Stage IIB, Invasive poorly differeniated sqaumous cell carcinoma, involiving bilateral external iliac nodes mets/  current treatment chemotherapy and radiation therapy  . History of bladder cancer urologist-  dr Karsten Ro   per path. 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 History of rib fracture    left side  . Hypertension   . Iron deficiency anemia   . Lower urinary tract symptoms (LUTS)   . Osteopenia   . Wears glasses     Past Surgical History:  Procedure Laterality Date  . CATARACT EXTRACTION W/ INTRAOCULAR LENS IMPLANT Right 10/2015  . COLONOSCOPY  2016  . CYSTOSCOPY WITH BIOPSY N/A 02/16/2016   Procedure: CYSTOSCOPY WITH  BLADDER BIOPSY;  Surgeon: [name], MD;  Location: [facility];  Service: Urology;  Laterality: N/A;  . ORIF RIGHT ANKLE FX  2006   retined hardware  . TANDEM RING INSERTION N/A 04/11/2016   Procedure: TANDEM RING INSERTION;  Surgeon: Gery Pray, MD;  Location: [facility];  Service: Urology;  Laterality:  N/A;  . TOTAL HIP ARTHROPLASTY Left 12/28/2014   Procedure: TOTAL HIP ARTHROPLASTY ANTERIOR APPROACH;  Surgeon: Leandrew Koyanagi, MD;  Location: [place];  Service: Orthopedics;  Laterality: Left;  femur fracture    There were no vitals filed for this visit.      Subjective Assessment - 11/19/16 1238    Subjective Usual nueropathy pain.    Patient is accompained by: Family member   Pertinent History cervical CA 2017 had radiation and chemo (PET scan on 21st);  neuropathy and fecal/urinary incontinence;  left hip fx 2016;  osteopenia;  had 3 PT treatments in [place] for lymphedema   Limitations House hold activities;Walking   How long can you sit comfortably? less pain with sitting   How long can you walk comfortably? with walker (all over the house)    Diagnostic tests PET scans   Patient Stated Goals walk unassisted   Currently in Pain? Yes   Pain Score 7    Pain Location Leg   Pain Orientation Right;Left;Lower                         [facility] Adult PT Treatment/Exercise - 11/19/16 0001      Neuro Re-ed    Neuro Re-ed Details  Rt ankle AAROM; glute and abdominal activation     Knee/Hip Exercises: Standing   Knee Flexion AROM;Both;2 sets;10 reps   Hip Abduction AROM;Both;2 sets;10  reps   Hip Extension AROM;Both;2 sets;10 reps   Other Standing Knee Exercises Mini squats x10     Knee/Hip Exercises: Seated   Long Arc Quad Strengthening;Both;2 sets;10 reps  #3   Heel Slides AROM  Rocker board both directions   Ball Squeeze x20 with core and glute activation   Clamshell with TheraBand Red  x20   Abduction/Adduction  --  Foam roll press for abdominal contraction x10     Modalities   Modalities Electrical Stimulation;Moist Heat     Electrical Stimulation   Electrical Stimulation Location Bil foot and ankles   Electrical Stimulation Action IFC   Electrical Stimulation Parameters 20 minutes concurrent with seated exercises   Electrical Stimulation Goals Pain                   PT Short Term Goals - 11/19/16 1240      PT SHORT TERM GOAL #1   Title be independent in initial HEP for LE strengthening 12/06/16   Time 4   Period Weeks   Status On-going     PT SHORT TERM GOAL #2   Title The patient will be able to maintain standing balance with min perturbances (turning head, reaching) without loss of balance   Time 4   Period Weeks   Status On-going     PT SHORT TERM GOAL #3   Title The patient will express understanding of fall risk and strategies to reduce risk of falling (lighting, remove throw rugs, continued full-time use of RW)   Time 4   Period Weeks   Status On-going           PT Long Term Goals - 11/19/16 1241      PT LONG TERM GOAL #1   Title be independent in progressive HEP for further improvements in ROM, strength and balance    01/03/17   Time 8   Period Weeks   Status On-going               Plan - 11/19/16 1317    Clinical Impression Statement Patient with continued neuropathy pain in Bil LE Rt>Lt. Patient did well with all seated exercises concurrent with Estim. Patient able to tolerate all standing exercises well with some fatigue. Pt needing verbal cueing for abdominal brace and good posture with exercises. Patient with continued weakness in Rt ankle. Did well with AAROM on rocker board and reported feel that her Rt ankle was moving on its own. Patient will continue to benefit from skilled thearpy for LE and core strenghtening.    Rehab Potential Good   Clinical Impairments Affecting Rehab Potential risk of falls, [place] no U/S;  lymphedema LE;  hip fx 2016   PT Frequency 2x / week   PT Duration 8 weeks   PT Treatment/Interventions ADLs/Self Care Home Management;Cryotherapy;Electrical Stimulation;Gait training;Neuromuscular re-education;Therapeutic exercise;Therapeutic activities;Balance training;Patient/family education;Manual techniques   PT Next Visit Plan Russian stim to Rt ankle, LE and core  strengthening   Consulted and Agree with Plan of Care Patient      Patient will benefit from skilled therapeutic intervention in order to improve the following deficits and impairments:  Pain, Decreased activity tolerance, Abnormal gait, Decreased balance, Decreased strength, Decreased range of motion, Difficulty walking  Visit Diagnosis: Muscle weakness (generalized)  Other abnormalities of gait and mobility  Pain in left lower leg  Pain in right lower leg  Weakness of left hip  Abnormality of gait     Problem List Patient Active Problem  List   Diagnosis Date Noted  . Malignant neoplasm of endocervix ([place])   . Malignant neoplasm of lateral wall of urinary bladder ([place]) 03/16/2016  . Extravasation, infusion or chemotherapeutic agent 03/13/2016  . Postmenopausal vaginal bleeding 02/28/2016  . Other iron deficiency anemias 02/28/2016  . Malignant neoplasm of exocervix ([place]) 01/31/2016  . Fracture of femoral neck, left ([place]) 12/28/2014  . Leukocytosis   . Elevated blood pressure     Jeanie Sewer PTA 11/19/2016, 1:22 PM  [facility] [address], [place], [place], [address] Phone: [phone]   Fax:  [phone]  Name: [place] JON MRN: [id number] Date of Birth: 19-Jan-1936

## 2016-11-20 ENCOUNTER — Ambulatory Visit: Payer: Commercial Managed Care - HMO | Admitting: Physical Therapy

## 2016-11-20 ENCOUNTER — Encounter: Payer: Self-pay | Admitting: Physical Therapy

## 2016-11-20 DIAGNOSIS — M6281 Muscle weakness (generalized): Secondary | ICD-10-CM

## 2016-11-20 DIAGNOSIS — Z483 Aftercare following surgery for neoplasm: Secondary | ICD-10-CM

## 2016-11-20 DIAGNOSIS — R279 Unspecified lack of coordination: Secondary | ICD-10-CM

## 2016-11-20 NOTE — Therapy (Signed)
[facility] [address] [place], [place], [address] Phone: [phone]   Fax:  [phone]  Physical Therapy Treatment  Patient Details  Name: Sherri Mendez MRN: [id number] Date of Birth: 1935/08/25 Referring Provider: Dr. Quentin Cornwall -Jump  Encounter Date: 11/20/2016      PT End of Session - 11/20/16 1328    Visit Number 5   Number of Visits 10   Date for PT Re-Evaluation 01/03/17   Authorization Type Medicare G codes;  KX at visit 11 (patient had 3 visits for Lymphedema in [place])   PT Start Time 1145   PT Stop Time 1230   PT Time Calculation (min) 45 min   Activity Tolerance Patient tolerated treatment well   Behavior During Therapy [facility] for tasks assessed/performed      Past Medical History:  Diagnosis Date  . Cervical cancer [facility]) dx 01-31-2016--  oncologist-  dr livesay/  dr Sondra Come   FIGO Stage IIB, Invasive poorly differeniated sqaumous cell carcinoma, involiving bilateral external iliac nodes mets/  current treatment chemotherapy and radiation therapy  . History of bladder cancer urologist-  dr Karsten Ro   per path. 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 History of rib fracture    left side  . Hypertension   . Iron deficiency anemia   . Lower urinary tract symptoms (LUTS)   . Osteopenia   . Wears glasses     Past Surgical History:  Procedure Laterality Date  . CATARACT EXTRACTION W/ INTRAOCULAR LENS IMPLANT Right 10/2015  . COLONOSCOPY  2016  . CYSTOSCOPY WITH BIOPSY N/A 02/16/2016   Procedure: CYSTOSCOPY WITH  BLADDER BIOPSY;  Surgeon: [name], MD;  Location: [facility];  Service: Urology;  Laterality: N/A;  . ORIF RIGHT ANKLE FX  2006   retined hardware  . TANDEM RING INSERTION N/A 04/11/2016   Procedure: TANDEM RING INSERTION;  Surgeon: Gery Pray, MD;  Location: [facility];  Service: Urology;  Laterality:  N/A;  . TOTAL HIP ARTHROPLASTY Left 12/28/2014   Procedure: TOTAL HIP ARTHROPLASTY ANTERIOR APPROACH;  Surgeon: Leandrew Koyanagi, MD;  Location: [place];  Service: Orthopedics;  Laterality: Left;  femur fracture    There were no vitals filed for this visit.      Subjective Assessment - 11/20/16 1151    Subjective I took stool softner so today I am having diarrhea. Some days I have stool and fecal incontinence. Started 01/2016 when she started treatment. I have spots on my lungs that need to be biopsy.  The cervical cancer is gone right now.    Pertinent History cervical CA 2017 had radiation and chemo (PET scan on 21st);  neuropathy and fecal/urinary incontinence;  left hip fx 2016;  osteopenia;  had 3 PT treatments in [facility] for lymphedema   How long can you sit comfortably? less pain with sitting   How long can you walk comfortably? with walker (all over the house)    Diagnostic tests PET scans   Patient Stated Goals walk unassisted, increase control of urine and stool leakage   Currently in Pain? Yes   Pain Score 7    Pain Location Leg   Pain Orientation Right;Left;Lower   Pain Descriptors / Indicators Burning;Pins and needles   Pain Type Acute pain   Pain Radiating Towards right foot to thigh, left to the knee   Pain Onset More than a month ago   Pain  Frequency Constant   Aggravating Factors  walking   Pain Relieving Factors getting off feet; sitting with feet on ottoman   Multiple Pain Sites No            [facility] PT Assessment - 11/20/16 0001      Assessment   Medical Diagnosis R26.81gait instability; N39.42 Urinary incontinence without sensory awareness   Referring Provider Dr. Quentin Cornwall -Jump   Onset Date/Surgical Date 01/30/16   Next MD Visit End of the month   Prior Therapy none for pelvic floor     Precautions   Precautions Fall   Precaution Comments cancer precautions     Restrictions   Weight Bearing Restrictions No     Balance Screen   Has the patient  fallen in the past 6 months Yes   How many times? 1x   Has the patient had a decrease in activity level because of a fear of falling?  Yes   Is the patient reluctant to leave their home because of a fear of falling?  Yes     [place] Private residence   Living Arrangements Alone   Available Help at Discharge Family;Friend(s)  neighbor   Home Access Stairs to enter   Entrance Stairs-Number of Steps 2   [place] One level   [place] - 2 wheels;Shower seat   Additional Comments squatty potty     Prior Function   Vocation Retired   Leisure reading, play cards, go out to eat; go to [employer] (flight of stairs into [employer])     Cognition   Overall Cognitive Status Within Functional Limits for tasks assessed     AROM   Right Ankle Dorsiflexion 0   Right Ankle Plantar Flexion 25   Right Ankle Inversion 0   Right Ankle Eversion 0   Left Ankle Dorsiflexion 3   Left Ankle Plantar Flexion 40   Left Ankle Inversion 5   Left Ankle Eversion 5     Strength   Right Hip Flexion 3/5   Right Hip Extension 3/5   Right Hip ABduction 3/5   Right Hip ADduction 3/5   Left Hip Flexion 4-/5   Left Hip Extension 3+/5   Left Hip ABduction 3+/5   Left Hip ADduction 4-/5   Right Knee Flexion 3/5   Right Knee Extension 3/5   Left Knee Flexion 3/5   Left Knee Extension 3/5   Right Ankle Dorsiflexion 0/5   Right Ankle Plantar Flexion 0/5   Right Ankle Inversion 0/5   Right Ankle Eversion 0/5   Left Ankle Dorsiflexion 3-/5   Left Ankle Plantar Flexion 3-/5   Left Ankle Inversion 3-/5   Left Ankle Eversion 3-/5   Lumbar Flexion 3-/5   Lumbar Extension 3-/5     Flexibility   Soft Tissue Assessment /Muscle Length yes   Hamstrings decreased lengths bilaterally  decreased gastroc lengths bilaterally     Ambulation/Gait   Ambulation Distance (Feet) 100 Feet   Assistive device 4-wheeled walker   Gait Pattern Poor foot clearance - right      Berg Balance Test   Sit to Stand Able to stand  independently using hands   Standing Unsupported Able to stand 2 minutes with supervision   Sitting with Back Unsupported but Feet Supported on Floor or Stool Able to sit safely and securely 2 minutes   Stand to Sit Uses backs of legs against chair to control descent   Transfers  Able to transfer safely, definite need of hands   Standing Unsupported with Eyes Closed Needs help to keep from falling   Standing Ubsupported with Feet Together Needs help to attain position and unable to hold for 15 seconds   From Standing, Reach Forward with Outstretched Arm Reaches forward but needs supervision   From Standing Position, Pick up Object from Floor Unable to try/needs assist to keep balance   From Standing Position, Turn to Look Behind Over each Shoulder Needs supervision when turning   Turn 360 Degrees Needs assistance while turning   Standing Unsupported, Alternately Place Feet on Step/Stool Needs assistance to keep from falling or unable to try   Standing Unsupported, One Foot in Front Loses balance while stepping or standing   Standing on One Leg Unable to try or needs assist to prevent fall   Total Score 17                  Pelvic Floor Special Questions - 11/20/16 0001    Currently Sexually Active No   Urinary Leakage Yes   Pad use depends 1  pads 3/day ( super pantyliner long)   Activities that cause leaking Other   Other activities that cause leaking sit to stand;    Urinary frequency --  has trouble to urinating   Fecal incontinence Yes  sit to stand; anytime; leaks out and unable to stop   Skin Integrity Intact   External Palpation labia minora is not present   Pelvic Floor Internal Exam Patient confirms identifcation and approves PT to assess pelvic floor muscles   Exam Type Vaginal   Palpation therapist is able to place her right index finger in the vaginal canal with slight room; tightness around the urethra, urethra  sphincter, intoritus, obturator internist, levator ani   Strength Flicker  in the vaginal canal able to feel the rectum tighten           [facility] Adult PT Treatment/Exercise - 11/20/16 0001      Manual Therapy   Manual Therapy Internal Pelvic Floor   Internal Pelvic Floor soft tissue work to introitus, bilateral urethra sphincter, puborectalis                PT Education - 11/20/16 1327    Education provided Yes   Education Details using a rolling pin to massage the muscles; diaphgramatic breathing, using a dilator   Person(s) Educated Patient   Methods Explanation;Demonstration;Verbal cues;Handout   Comprehension Returned demonstration;Verbalized understanding          PT Short Term Goals - 11/20/16 1338      PT SHORT TERM GOAL #1   Title be independent in initial HEP for LE strengthening 12/06/16   Time 4   Period Weeks   Status On-going     PT SHORT TERM GOAL #2   Title The patient will be able to maintain standing balance with min perturbances (turning head, reaching) without loss of balance   Time 4   Period Weeks   Status On-going     PT SHORT TERM GOAL #3   Title The patient will express understanding of fall risk and strategies to reduce risk of falling (lighting, remove throw rugs, continued full-time use of RW)   Time 4   Period Weeks   Status On-going     PT SHORT TERM GOAL #4   Title ability to use the larger dilator to expand the vaginal canal and begin on muscle mobility   Time  4   Period Weeks   Status New     PT SHORT TERM GOAL #5   Title urinary leakage decreased >/= 25% due to improved pelvic floor contraction   Time 4   Period Weeks   Status New           PT Long Term Goals - 11/20/16 1340      PT LONG TERM GOAL #1   Title be independent in progressive HEP for further improvements in ROM, strength and balance    01/03/17   Time 8   Period Weeks   Status On-going     PT LONG TERM GOAL #2   Title The patient will have  improved core, hip and knee strength to grossly 4-/5 needed for stability with walking, 2 steps to enter home, sit to stand   Time 8   Period Weeks   Status New     PT LONG TERM GOAL #3   Title The patient will have improved BERG score to 26/56 indicating improved balance and reduced risk of falls   Time 8   Period Weeks   Status New     PT LONG TERM GOAL #4   Title perform community ambulation, going to restaurants, play cards at friends homes with 25% less discomfort in her legs   Time 8   Period Weeks   Status New     PT LONG TERM GOAL #5   Title demonstrate 4+/5 Lt hip strength to improve endurance and safety with gait   Time 8   Period Weeks   Status On-going     Additional Long Term Goals   Additional Long Term Goals Yes     PT LONG TERM GOAL #6   Title urinary leakage decreased when going from sit to stand >/= 75% so she only has to wear 1 pad per day   Time 8   Period Weeks   Status New     PT LONG TERM GOAL #7   Title fecal leakage decreased >/ 75% due to ability to control her diarrhea with diet, abdominal massage, and increased in pelvic floor strength >/= 3/5   Time 8   Period Weeks   Status New     PT LONG TERM GOAL #8   Title ability to go out and socialize 50%  more due to improve continence    Time 8   Period Weeks   Status New               Plan - 11/20/16 1328    Clinical Impression Statement Patient was assessed today for her urinary incontinence and bowel incontinence.  Patient has to wear 1 depends per day when she is having diarrhea and 3 pads per day for urinary leakage.  Patient reports when she has diarrhea she is unable to hold it back and when she goes from sit to stand.  Patient reports she will leak urine when she goes from sit to stand or by the end of the day whne she has not emptied her bladder.  Patient reports she will try to urinate at times and is unable to.  Patient is presently using the small dilator without pain and is  moving it in different directions. Patient has radiated changes to the perineal area and muscle due to her treatment for the cervical cancer last year including radiation and chemotherapy.  Pelvic floor strength is 1/5 with just a flicker.  Patient has tightness around the urethra, urethra  sphincter, levator ani and obturator internist.  Therapist is able to place her index finger into the vaginal opening with minimal extral room.  Patient continues to have decreased strength in bilateral lower extremities and decreased bilateral hip range of motion.  Patient will walk with right drop foot.  Patient is moderately complex patient with her history of cervical cancer, vulva cancer, Osteopenia and left total hip replacement.  Patient will benefit from skilled therapy to reduce urinary and fecal leakage while improve LE and core strengthening.    Rehab Potential Good   Clinical Impairments Affecting Rehab Potential risk of falls, CA no U/S;  lymphedema LE;  hip fx 2016   PT Frequency 2x / week   PT Duration 8 weeks   PT Treatment/Interventions ADLs/Self Care Home Management;Cryotherapy;Electrical Stimulation;Gait training;Neuromuscular re-education;Therapeutic exercise;Therapeutic activities;Balance training;Patient/family education;Manual techniques;Biofeedback;Passive range of motion   PT Next Visit Plan Russian stim to Rt ankle, LE and core strengthening; soft tissue to the vaginal and lower abdominal area, bil. hip stretches,    PT Home Exercise Plan progress as needed   Recommended Other Services if right foot motor control does not return may need AFO   Consulted and Agree with Plan of Care Patient      Patient will benefit from skilled therapeutic intervention in order to improve the following deficits and impairments:  Pain, Decreased activity tolerance, Abnormal gait, Decreased balance, Decreased strength, Decreased range of motion, Difficulty walking, Increased fascial restricitons, Impaired  flexibility  Visit Diagnosis: Muscle weakness (generalized) - Plan: PT plan of care cert/re-cert  Unspecified lack of coordination - Plan: PT plan of care cert/re-cert  Aftercare following surgery for neoplasm - Plan: PT plan of care cert/re-cert       G-Codes - 2016/12/14 1319    Functional Assessment Tool Used (Outpatient Only) FOTO score 51% limitation for urinary problem   Functional Limitation Other PT primary   Other PT Primary Current Status (O2423) At least 40 percent but less than 60 percent impaired, limited or restricted   Other PT Primary Goal Status (N3614) At least 40 percent but less than 60 percent impaired, limited or restricted   Other PT Primary Discharge Status (E3154) At least 40 percent but less than 60 percent impaired, limited or restricted      Problem List Patient Active Problem List   Diagnosis Date Noted  . Malignant neoplasm of endocervix ([place])   . Malignant neoplasm of lateral wall of urinary bladder ([place]) 03/16/2016  . Extravasation, infusion or chemotherapeutic agent 03/13/2016  . Postmenopausal vaginal bleeding 02/28/2016  . Other iron deficiency anemias 02/28/2016  . Malignant neoplasm of exocervix ([place]) 01/31/2016  . Fracture of femoral neck, left ([place]) 12/28/2014  . Leukocytosis   . Elevated blood pressure     Earlie Counts, PT 12/14/16 1:47 PM   [facility] [address] [place], [place], [address] Phone: [phone]   Fax:  [phone]  Name: Sherri Mendez MRN: [id number] Date of Birth: 29-Dec-1935

## 2016-11-20 NOTE — Patient Instructions (Addendum)
Start with the smaller dilator for 2 min then go to the larger dilator for 5 min. Do daily  Get a rolling pin or prickly roller to massage the front, inner thigh, outer side and back of thigh daily. ( can get a prickly massage ball roller on [employer])    Sitting    Sit comfortably. Allow body's muscles to relax. Place hands on belly. Inhale slowly and deeply for 5___ seconds, so hands move out. Then take _5__ seconds to exhale. Repeat _5__ times. Do _3__ times a day. And when trying to urinate.  Copyright  VHI. All rights reserved.    Go to the bathroom every 2-3 hours to urinate or bowel movement.   [facility] [address], [place], [place] [address] Phone # [phone] Fax [phone]

## 2016-11-21 ENCOUNTER — Encounter: Payer: Commercial Managed Care - HMO | Admitting: Physical Therapy

## 2016-11-26 ENCOUNTER — Ambulatory Visit: Payer: Commercial Managed Care - HMO | Admitting: Physical Therapy

## 2016-11-26 DIAGNOSIS — M6281 Muscle weakness (generalized): Secondary | ICD-10-CM

## 2016-11-26 DIAGNOSIS — R279 Unspecified lack of coordination: Secondary | ICD-10-CM

## 2016-11-26 DIAGNOSIS — M79662 Pain in left lower leg: Secondary | ICD-10-CM

## 2016-11-26 DIAGNOSIS — M79661 Pain in right lower leg: Secondary | ICD-10-CM

## 2016-11-26 NOTE — Patient Instructions (Signed)
     Patient would benefit from a right AFO (ankle foot orthosis) for foot drop to decrease risk of falls.  Please give referral to patient or send referral to [facility])     Ruben Im PT [facility] [address], [place], [place] [address] Phone # [phone] Fax [phone]

## 2016-11-26 NOTE — Therapy (Signed)
[facility] [address], [place], [place], [address] Phone: [phone]   Fax:  [phone]  Physical Therapy Treatment  Patient Details  Name: Sherri Mendez MRN: [id number] Date of Birth: 02-Jan-1936 Referring Provider: Dr. Quentin Cornwall -Jump  Encounter Date: 11/26/2016      PT End of Session - 11/26/16 1403    Visit Number 6   Number of Visits 10   Date for PT Re-Evaluation 01/03/17   Authorization Type Medicare G codes;  KX at visit 11 (patient had 3 visits for Lymphedema in [place])   PT Start Time 1233   PT Stop Time 1320   PT Time Calculation (min) 47 min   Activity Tolerance Patient tolerated treatment well      Past Medical History:  Diagnosis Date  . Cervical cancer [facility]) dx 01-31-2016--  oncologist-  dr livesay/  dr Sondra Come   FIGO Stage IIB, Invasive poorly differeniated sqaumous cell carcinoma, involiving bilateral external iliac nodes mets/  current treatment chemotherapy and radiation therapy  . History of bladder cancer urologist-  dr Karsten Ro   per path. 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 History of rib fracture    left side  . Hypertension   . Iron deficiency anemia   . Lower urinary tract symptoms (LUTS)   . Osteopenia   . Wears glasses     Past Surgical History:  Procedure Laterality Date  . CATARACT EXTRACTION W/ INTRAOCULAR LENS IMPLANT Right 10/2015  . COLONOSCOPY  2016  . CYSTOSCOPY WITH BIOPSY N/A 02/16/2016   Procedure: CYSTOSCOPY WITH  BLADDER BIOPSY;  Surgeon: [name], MD;  Location: [facility];  Service: Urology;  Laterality: N/A;  . ORIF RIGHT ANKLE FX  2006   retined hardware  . TANDEM RING INSERTION N/A 04/11/2016   Procedure: TANDEM RING INSERTION;  Surgeon: Gery Pray, MD;  Location: [facility];  Service: Urology;  Laterality: N/A;  . TOTAL HIP ARTHROPLASTY Left 12/28/2014   Procedure: TOTAL HIP ARTHROPLASTY ANTERIOR APPROACH;  Surgeon: Leandrew Koyanagi, MD;  Location: [place];  Service: Orthopedics;  Laterality: Left;  femur fracture    There were no vitals filed for this visit.      Subjective Assessment - 11/26/16 1230    Subjective The pain is still the same.  My pain is all over.  Most of the pain though is in the bottoms of my feet.  They feel like they are on fire especially in the mornings.     Currently in Pain? Yes   Pain Score 6    Pain Location Foot   Pain Orientation Right;Left   Aggravating Factors  walking   Pain Relieving Factors getting off feet.                           [facility] Adult PT Treatment/Exercise - 11/26/16 0001      Therapeutic Activites    Therapeutic Activities Other Therapeutic Activities   Other Therapeutic Activities standing, walking     Neuro Re-ed    Neuro Re-ed Details  anterior tibialis, quads, gluteals, core     Knee/Hip Exercises: Standing   Other Standing Knee Exercises weight shifting with electrical stimulation     Knee/Hip Exercises: Seated   Long Arc Quad Strengthening;Right;Left;10 reps   Knee/Hip Flexion red band 10x right/left   Other Seated Knee/Hip Exercises rocker board with e-stimulation  Theme park manager Bil anterior tibialis   Scientist, research (life sciences) Parameters 15 min 10on/10off    Electrical Stimulation Goals Neuromuscular facilitation                PT Education - 11/26/16 1307    Education provided Yes   Education Details AFO info   Person(s) Educated Patient   Methods Explanation   Comprehension Verbalized understanding          PT Short Term Goals - 11/26/16 1944      PT SHORT TERM GOAL #1   Title be independent in initial HEP for LE strengthening 12/06/16   Time 4   Period Weeks   Status On-going     PT SHORT TERM GOAL #2   Title The patient will be able to maintain  standing balance with min perturbances (turning head, reaching) without loss of balance   Time 4   Period Weeks   Status On-going     PT SHORT TERM GOAL #3   Title The patient will express understanding of fall risk and strategies to reduce risk of falling (lighting, remove throw rugs, continued full-time use of RW)   Status Achieved     PT SHORT TERM GOAL #4   Title ability to use the larger dilator to expand the vaginal canal and begin on muscle mobility   Time 4   Period Weeks   Status On-going     PT SHORT TERM GOAL #5   Title urinary leakage decreased >/= 25% due to improved pelvic floor contraction   Time 4   Period Weeks   Status On-going           PT Long Term Goals - 11/26/16 1945      PT LONG TERM GOAL #1   Title be independent in progressive HEP for further improvements in ROM, strength and balance    01/03/17   Time 8   Period Weeks   Status On-going     PT LONG TERM GOAL #2   Title The patient will have improved core, hip and knee strength to grossly 4-/5 needed for stability with walking, 2 steps to enter home, sit to stand   Time 8   Period Weeks   Status On-going     PT LONG TERM GOAL #4   Title perform community ambulation, going to restaurants, play cards at friends homes with 25% less discomfort in her legs   Time 8   Period Weeks   Status On-going     PT LONG TERM GOAL #5   Title demonstrate 4+/5 Lt hip strength to improve endurance and safety with gait   Time 8   Status On-going     PT LONG TERM GOAL #6   Title urinary leakage decreased when going from sit to stand >/= 75% so she only has to wear 1 pad per day   Time 8   Period Weeks   Status On-going     PT LONG TERM GOAL #7   Title fecal leakage decreased >/ 75% due to ability to control her diarrhea with diet, abdominal massage, and increased in pelvic floor strength >/= 3/5   Time 8   Period Weeks   Status On-going     PT LONG TERM GOAL #8   Title ability to go out and socialize  50%  more due to improve continence    Time 8   Period Weeks  Status On-going               Plan - 11/26/16 1403    Clinical Impression Statement The patient reports exercise does not affect her bilateral leg pain (no worse but no better either.)  The continues to have a significant foot drop right > left.  Recommend right custom AFO and patient will take recommendation to the doctor on Thursday.   Poor activation of  anterior tibialis muscles even with NMES.     PT Treatment/Interventions ADLs/Self Care Home Management;Cryotherapy;Electrical Stimulation;Gait training;Neuromuscular re-education;Therapeutic exercise;Therapeutic activities;Balance training;Patient/family education;Manual techniques;Biofeedback;Passive range of motion   PT Next Visit Plan referral to orthotist for AFO;  Russian stim to Rt ankle, LE and core strengthening; soft tissue to the vaginal and lower abdominal area, bil. hip stretches,       Patient will benefit from skilled therapeutic intervention in order to improve the following deficits and impairments:  Pain, Decreased activity tolerance, Abnormal gait, Decreased balance, Decreased strength, Decreased range of motion, Difficulty walking, Increased fascial restricitons, Impaired flexibility  Visit Diagnosis: Muscle weakness (generalized)  Pain in left lower leg  Pain in right lower leg  Unspecified lack of coordination     Problem List Patient Active Problem List   Diagnosis Date Noted  . Malignant neoplasm of endocervix ([place])   . Malignant neoplasm of lateral wall of urinary bladder ([place]) 03/16/2016  . Extravasation, infusion or chemotherapeutic agent 03/13/2016  . Postmenopausal vaginal bleeding 02/28/2016  . Other iron deficiency anemias 02/28/2016  . Malignant neoplasm of exocervix ([place]) 01/31/2016  . Fracture of femoral neck, left ([place]) 12/28/2014  . Leukocytosis   . Elevated blood pressure    Ruben Im, PT 11/26/16 7:48 PM Phone:  [phone] Fax: [phone]  Alvera Singh 11/26/2016, 7:47 PM  [facility] [address] [place], [place], [address] Phone: [phone]   Fax:  [phone]  Name: Sherri Mendez MRN: [id number] Date of Birth: 1935-10-29

## 2016-11-29 ENCOUNTER — Encounter: Payer: Self-pay | Admitting: Physical Therapy

## 2016-11-29 ENCOUNTER — Ambulatory Visit: Payer: Commercial Managed Care - HMO | Attending: Obstetrics and Gynecology | Admitting: Physical Therapy

## 2016-11-29 DIAGNOSIS — R2689 Other abnormalities of gait and mobility: Secondary | ICD-10-CM | POA: Diagnosis present

## 2016-11-29 DIAGNOSIS — M6281 Muscle weakness (generalized): Secondary | ICD-10-CM | POA: Insufficient documentation

## 2016-11-29 DIAGNOSIS — R29898 Other symptoms and signs involving the musculoskeletal system: Secondary | ICD-10-CM | POA: Diagnosis present

## 2016-11-29 DIAGNOSIS — Z483 Aftercare following surgery for neoplasm: Secondary | ICD-10-CM | POA: Diagnosis present

## 2016-11-29 DIAGNOSIS — R269 Unspecified abnormalities of gait and mobility: Secondary | ICD-10-CM | POA: Diagnosis present

## 2016-11-29 DIAGNOSIS — M79661 Pain in right lower leg: Secondary | ICD-10-CM | POA: Insufficient documentation

## 2016-11-29 DIAGNOSIS — R279 Unspecified lack of coordination: Secondary | ICD-10-CM | POA: Insufficient documentation

## 2016-11-29 DIAGNOSIS — M79662 Pain in left lower leg: Secondary | ICD-10-CM | POA: Insufficient documentation

## 2016-11-29 NOTE — Therapy (Signed)
[facility] [address] [place], [place], [address] Phone: [phone]   Fax:  [phone]  Physical Therapy Treatment  Patient Details  Name: Sherri Mendez MRN: [id number] Date of Birth: [birth year] Referring Provider: Dr. Quentin Cornwall -Jump  Encounter Date: 11/29/2016      PT End of Session - 11/29/16 0941    Visit Number 7   Number of Visits 10   Date for PT Re-Evaluation 01/03/17   Authorization Type Medicare G codes;  KX at visit 11 (patient had 3 visits for Lymphedema in [place])   PT Start Time [phone]  Patient late   PT Stop Time 1017   PT Time Calculation (min) 38 min   Activity Tolerance Patient tolerated treatment well   Behavior During Therapy [facility] for tasks assessed/performed      Past Medical History:  Diagnosis Date  . Cervical cancer [facility]) dx 01-31-2016--  oncologist-  dr livesay/  dr Sondra Come   FIGO Stage IIB, Invasive poorly differeniated sqaumous cell carcinoma, involiving bilateral external iliac nodes mets/  current treatment chemotherapy and radiation therapy  . History of bladder cancer urologist-  dr Karsten Ro   per path. 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 History of rib fracture    left side  . Hypertension   . Iron deficiency anemia   . Lower urinary tract symptoms (LUTS)   . Osteopenia   . Wears glasses     Past Surgical History:  Procedure Laterality Date  . CATARACT EXTRACTION W/ INTRAOCULAR LENS IMPLANT Right 10/2015  . COLONOSCOPY  2016  . CYSTOSCOPY WITH BIOPSY N/A 02/16/2016   Procedure: CYSTOSCOPY WITH  BLADDER BIOPSY;  Surgeon: [name], MD;  Location: [facility];  Service: Urology;  Laterality: N/A;  . ORIF RIGHT ANKLE FX  2006   retined hardware  . TANDEM RING INSERTION N/A 04/11/2016   Procedure: TANDEM RING INSERTION;  Surgeon: Gery Pray, MD;  Location: [facility];  Service:  Urology;  Laterality: N/A;  . TOTAL HIP ARTHROPLASTY Left 12/28/2014   Procedure: TOTAL HIP ARTHROPLASTY ANTERIOR APPROACH;  Surgeon: Leandrew Koyanagi, MD;  Location: [facility];  Service: Orthopedics;  Laterality: Left;  femur fracture    There were no vitals filed for this visit.      Subjective Assessment - 11/29/16 0940    Subjective I can't tell my neuropathy is any better.    Patient is accompained by: Family member   Pertinent History cervical CA 2017 had radiation and chemo (PET scan on 21st);  neuropathy and fecal/urinary incontinence;  left hip fx 2016;  osteopenia;  had 3 PT treatments in [place] for lymphedema   Limitations House hold activities;Walking   How long can you sit comfortably? less pain with sitting   How long can you walk comfortably? with walker (all over the house)    Diagnostic tests PET scans   Patient Stated Goals walk unassisted, increase control of urine and stool leakage   Currently in Pain? Yes   Pain Score 6    Pain Location Foot   Pain Orientation Right;Left   Pain Descriptors / Indicators Burning   Pain Type Acute pain                         [facility] Adult PT Treatment/Exercise - 11/29/16 0001      Neuro Re-ed    Neuro Re-ed Details  anterior tibialis, quads, gluteals, core     Lumbar Exercises: Supine   Glut Set 20 reps  With ball squeeze   Clam 20 reps  With yellow band   Bent Knee Raise 20 reps   Bridge 10 reps  With ball squeeze   Straight Leg Raise 10 reps     Lumbar Exercises: Sidelying   Clam --     Knee/Hip Exercises: Aerobic   Stationary Bike Nustep L2 x 10 minutes  Therapist present to discuss treatment     Knee/Hip Exercises: Standing   Other Standing Knee Exercises Mini squats x10     Knee/Hip Exercises: Seated   Long Arc Quad Strengthening;Right;Left;10 reps  #2 with VC for core activation   Heel Slides AROM  Rocker board both directions   Ball Squeeze x20 with core and glute activation   Clamshell  with TheraBand --   Abduction/Adduction  --  Foam roll press for abdominal contraction x10   Sit to [employer] --                  PT Short Term Goals - 11/26/16 1944      PT SHORT TERM GOAL #1   Title be independent in initial HEP for LE strengthening 12/06/16   Time 4   Period Weeks   Status On-going     PT SHORT TERM GOAL #2   Title The patient will be able to maintain standing balance with min perturbances (turning head, reaching) without loss of balance   Time 4   Period Weeks   Status On-going     PT SHORT TERM GOAL #3   Title The patient will express understanding of fall risk and strategies to reduce risk of falling (lighting, remove throw rugs, continued full-time use of RW)   Status Achieved     PT SHORT TERM GOAL #4   Title ability to use the larger dilator to expand the vaginal canal and begin on muscle mobility   Time 4   Period Weeks   Status On-going     PT SHORT TERM GOAL #5   Title urinary leakage decreased >/= 25% due to improved pelvic floor contraction   Time 4   Period Weeks   Status On-going           PT Long Term Goals - 11/26/16 1945      PT LONG TERM GOAL #1   Title be independent in progressive HEP for further improvements in ROM, strength and balance    01/03/17   Time 8   Period Weeks   Status On-going     PT LONG TERM GOAL #2   Title The patient will have improved core, hip and knee strength to grossly 4-/5 needed for stability with walking, 2 steps to enter home, sit to stand   Time 8   Period Weeks   Status On-going     PT LONG TERM GOAL #4   Title perform community ambulation, going to restaurants, play cards at friends homes with 25% less discomfort in her legs   Time 8   Period Weeks   Status On-going     PT LONG TERM GOAL #5   Title demonstrate 4+/5 Lt hip strength to improve endurance and safety with gait   Time 8   Status On-going     PT LONG TERM GOAL #6   Title urinary leakage decreased when going from sit to  stand >/= 75% so she only has to wear 1  pad per day   Time 8   Period Weeks   Status On-going     PT LONG TERM GOAL #7   Title fecal leakage decreased >/ 75% due to ability to control her diarrhea with diet, abdominal massage, and increased in pelvic floor strength >/= 3/5   Time 8   Period Weeks   Status On-going     PT LONG TERM GOAL #8   Title ability to go out and socialize 50%  more due to improve continence    Time 8   Period Weeks   Status On-going               Plan - 11/29/16 1032    Clinical Impression Statement Patient with continued neuropathy pain in Bil LE. Patient gave note for AFO to MD and gave her an order for AFO. Patient did well with all strengthening exercises. Did not use Estim today as it showed to have little to no effect on patients pain levels during standing. Patient will continue to benefit from skilled therapy for LE and core strengthening.    Rehab Potential Good   Clinical Impairments Affecting Rehab Potential risk of falls, CA no U/S;  lymphedema LE;  hip fx 2016   PT Frequency 2x / week   PT Duration 8 weeks   PT Treatment/Interventions ADLs/Self Care Home Management;Cryotherapy;Electrical Stimulation;Gait training;Neuromuscular re-education;Therapeutic exercise;Therapeutic activities;Balance training;Patient/family education;Manual techniques;Biofeedback;Passive range of motion   PT Next Visit Plan referral to orthotist for AFO;  Russian stim to Rt ankle, LE and core strengthening; soft tissue to the vaginal and lower abdominal area, bil. hip stretches,    Consulted and Agree with Plan of Care Patient      Patient will benefit from skilled therapeutic intervention in order to improve the following deficits and impairments:  Pain, Decreased activity tolerance, Abnormal gait, Decreased balance, Decreased strength, Decreased range of motion, Difficulty walking, Increased fascial restricitons, Impaired flexibility  Visit Diagnosis: Muscle  weakness (generalized)  Pain in left lower leg  Pain in right lower leg  Unspecified lack of coordination     Problem List Patient Active Problem List   Diagnosis Date Noted  . Malignant neoplasm of endocervix ([place])   . Malignant neoplasm of lateral wall of urinary bladder ([place]) 03/16/2016  . Extravasation, infusion or chemotherapeutic agent 03/13/2016  . Postmenopausal vaginal bleeding 02/28/2016  . Other iron deficiency anemias 02/28/2016  . Malignant neoplasm of exocervix ([place]) 01/31/2016  . Fracture of femoral neck, left ([place]) 12/28/2014  . Leukocytosis   . Elevated blood pressure     Jeanie Sewer PTA 11/29/2016, 10:36 AM  [facility] [address], [place], [place], [address] Phone: [phone]   Fax:  [phone]  Name: Sherri Mendez MRN: [id number] Date of Birth: 01-Feb-1936

## 2016-12-03 ENCOUNTER — Encounter: Payer: Commercial Managed Care - HMO | Admitting: Physical Therapy

## 2016-12-03 ENCOUNTER — Ambulatory Visit: Payer: Commercial Managed Care - HMO | Admitting: Physical Therapy

## 2016-12-05 ENCOUNTER — Ambulatory Visit: Payer: Commercial Managed Care - HMO | Admitting: Physical Therapy

## 2016-12-09 ENCOUNTER — Ambulatory Visit: Payer: Commercial Managed Care - HMO | Admitting: Physical Therapy

## 2016-12-09 ENCOUNTER — Encounter: Payer: Self-pay | Admitting: Physical Therapy

## 2016-12-09 DIAGNOSIS — M79662 Pain in left lower leg: Secondary | ICD-10-CM

## 2016-12-09 DIAGNOSIS — R2689 Other abnormalities of gait and mobility: Secondary | ICD-10-CM

## 2016-12-09 DIAGNOSIS — M6281 Muscle weakness (generalized): Secondary | ICD-10-CM

## 2016-12-09 DIAGNOSIS — R279 Unspecified lack of coordination: Secondary | ICD-10-CM

## 2016-12-09 DIAGNOSIS — M79661 Pain in right lower leg: Secondary | ICD-10-CM

## 2016-12-09 DIAGNOSIS — Z483 Aftercare following surgery for neoplasm: Secondary | ICD-10-CM

## 2016-12-09 NOTE — Therapy (Signed)
[facility] [address], [place], [place], [address] Phone: [phone]   Fax:  [phone]  Physical Therapy Treatment  Patient Details  Name: Sherri Mendez MRN: [id number] Date of Birth: 1935-09-28 Referring Provider: Dr. Quentin Cornwall -Jump  Encounter Date: 12/09/2016      PT End of Session - 12/09/16 1237    Visit Number 8   Number of Visits 10   Date for PT Re-Evaluation 01/03/17   Authorization Type Medicare G codes;  KX at visit 11 (patient had 3 visits for Lymphedema in [place])   PT Start Time 1145   PT Stop Time 1230  2 treatment slots   PT Time Calculation (min) 45 min   Activity Tolerance Patient tolerated treatment well   Behavior During Therapy [facility] for tasks assessed/performed      Past Medical History:  Diagnosis Date  . Cervical cancer [facility]) dx 01-31-2016--  oncologist-  dr livesay/  dr Sondra Come   FIGO Stage IIB, Invasive poorly differeniated sqaumous cell carcinoma, involiving bilateral external iliac nodes mets/  current treatment chemotherapy and radiation therapy  . History of bladder cancer urologist-  dr Karsten Ro   per path. 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 History of rib fracture    left side  . Hypertension   . Iron deficiency anemia   . Lower urinary tract symptoms (LUTS)   . Osteopenia   . Wears glasses     Past Surgical History:  Procedure Laterality Date  . CATARACT EXTRACTION W/ INTRAOCULAR LENS IMPLANT Right 10/2015  . COLONOSCOPY  2016  . CYSTOSCOPY WITH BIOPSY N/A 02/16/2016   Procedure: CYSTOSCOPY WITH  BLADDER BIOPSY;  Surgeon: [name], MD;  Location: [facility];  Service: Urology;  Laterality: N/A;  . ORIF RIGHT ANKLE FX  2006   retined hardware  . TANDEM RING INSERTION N/A 04/11/2016   Procedure: TANDEM RING INSERTION;  Surgeon: Gery Pray, MD;  Location: [facility];  Service:  Urology;  Laterality: N/A;  . TOTAL HIP ARTHROPLASTY Left 12/28/2014   Procedure: TOTAL HIP ARTHROPLASTY ANTERIOR APPROACH;  Surgeon: Leandrew Koyanagi, MD;  Location: [facility];  Service: Orthopedics;  Laterality: Left;  femur fracture    There were no vitals filed for this visit.      Subjective Assessment - 12/09/16 1156    Subjective no change in urinary leakage.  Stool leakage is 30% better.    Pertinent History cervical CA 2017 had radiation and chemo (PET scan on 21st);  neuropathy and fecal/urinary incontinence;  left hip fx 2016;  osteopenia;  had 3 PT treatments in [place] for lymphedema   Limitations House hold activities;Walking   How long can you sit comfortably? less pain with sitting   How long can you walk comfortably? with walker (all over the house)    Diagnostic tests PET scans   Patient Stated Goals walk unassisted, increase control of urine and stool leakage   Currently in Pain? No/denies                      Pelvic Floor Special Questions - 12/09/16 0001    Pelvic Floor Internal Exam Patient confirms identifcation and approves PT to assess pelvic floor muscles   Exam Type Vaginal           [facility] Adult PT Treatment/Exercise - 12/09/16 0001      Lumbar Exercises: Supine  Bridge 20 reps;2 seconds     Knee/Hip Exercises: Aerobic   Stationary Bike Nustep L2 x 10 minutes  Therapist present to discuss treatment     Knee/Hip Exercises: Seated   Long Arc Quad Strengthening;Both;2 sets;10 reps;Weights   Long Arc Quad Weight 2 lbs.  Ball squeeze to initiate ex   [employer] x20 with core and glute activation  VC to contract pelvic floor front and back   Knee/Hip Flexion 10x   VC to maintain good posture with pelvic floor lift throughou   Sit to Sand 1 set;10 reps;with UE support  2x5 VC for safety                PT Education - 12/09/16 1237    Education provided Yes   Education Details pelvic floor exercise   Person(s) Educated  Patient   Methods Explanation;Demonstration;Verbal cues;Handout   Comprehension Returned demonstration;Verbalized understanding          PT Short Term Goals - 12/09/16 1155      PT SHORT TERM GOAL #4   Title ability to use the larger dilator to expand the vaginal canal and begin on muscle mobility   Time 4   Period Weeks   Status On-going  on dilator #1     PT SHORT TERM GOAL #5   Title urinary leakage decreased >/= 25% due to improved pelvic floor contraction   Time 4   Period Weeks   Status On-going  no change yet           PT Long Term Goals - 11/26/16 1945      PT LONG TERM GOAL #1   Title be independent in progressive HEP for further improvements in ROM, strength and balance    01/03/17   Time 8   Period Weeks   Status On-going     PT LONG TERM GOAL #2   Title The patient will have improved core, hip and knee strength to grossly 4-/5 needed for stability with walking, 2 steps to enter home, sit to stand   Time 8   Period Weeks   Status On-going     PT LONG TERM GOAL #4   Title perform community ambulation, going to restaurants, play cards at friends homes with 25% less discomfort in her legs   Time 8   Period Weeks   Status On-going     PT LONG TERM GOAL #5   Title demonstrate 4+/5 Lt hip strength to improve endurance and safety with gait   Time 8   Status On-going     PT LONG TERM GOAL #6   Title urinary leakage decreased when going from sit to stand >/= 75% so she only has to wear 1 pad per day   Time 8   Period Weeks   Status On-going     PT LONG TERM GOAL #7   Title fecal leakage decreased >/ 75% due to ability to control her diarrhea with diet, abdominal massage, and increased in pelvic floor strength >/= 3/5   Time 8   Period Weeks   Status On-going     PT LONG TERM GOAL #8   Title ability to go out and socialize 50%  more due to improve continence    Time 8   Period Weeks   Status On-going               Plan - 12/09/16 1237     Clinical Impression Statement Patient is able to contract  her pelvic floor muscles and better contraction with tapping of the muscles.  Increased mobility of the pelvic floor muscles and of the urethra. Patient stool leakage is better by 30%.     Rehab Potential Good   Clinical Impairments Affecting Rehab Potential risk of falls, CA no U/S;  lymphedema LE;  hip fx 2016   PT Frequency 2x / week   PT Duration 8 weeks   PT Treatment/Interventions ADLs/Self Care Home Management;Cryotherapy;Electrical Stimulation;Gait training;Neuromuscular re-education;Therapeutic exercise;Therapeutic activities;Balance training;Patient/family education;Manual techniques;Biofeedback;Passive range of motion   PT Next Visit Plan continue with soft tissue work to the pelvic floor muscles and increasing strength   PT Home Exercise Plan progress as needed   Consulted and Agree with Plan of Care Patient      Patient will benefit from skilled therapeutic intervention in order to improve the following deficits and impairments:  Pain, Decreased activity tolerance, Abnormal gait, Decreased balance, Decreased strength, Decreased range of motion, Difficulty walking, Increased fascial restricitons, Impaired flexibility  Visit Diagnosis: Muscle weakness (generalized)  Pain in left lower leg  Pain in right lower leg  Unspecified lack of coordination  Aftercare following surgery for neoplasm  Other abnormalities of gait and mobility     Problem List Patient Active Problem List   Diagnosis Date Noted  . Malignant neoplasm of endocervix ([place])   . Malignant neoplasm of lateral wall of urinary bladder ([place]) 03/16/2016  . Extravasation, infusion or chemotherapeutic agent 03/13/2016  . Postmenopausal vaginal bleeding 02/28/2016  . Other iron deficiency anemias 02/28/2016  . Malignant neoplasm of exocervix ([place]) 01/31/2016  . Fracture of femoral neck, left ([place]) 12/28/2014  . Leukocytosis   . Elevated blood pressure      Earlie Counts, PT 12/09/16 1:12 PM    [facility] [address], [place], [place], [address] Phone: [phone]   Fax:  [phone]  Name: Sherri Mendez MRN: [id number] Date of Birth: 1936-06-02

## 2016-12-09 NOTE — Therapy (Signed)
[facility] [address] [place], [place], [address] Phone: [phone]   Fax:  [phone]  Physical Therapy Treatment  Patient Details  Name: Sherri Mendez MRN: [id number] Date of Birth: 1935/12/14 Referring Provider: Dr. Quentin Cornwall -Jump  Encounter Date: 12/09/2016      PT End of Session - 12/09/16 1312    Visit Number 8   Number of Visits 10   Date for PT Re-Evaluation 01/03/17   Authorization Type Medicare G codes;  KX at visit 11 (patient had 3 visits for L[place]hedema in [place])   PT Start Time 1230   PT Stop Time 1310   PT Time Calculation (min) 40 min   Activity Tolerance Patient tolerated treatment well      Past Medical History:  Diagnosis Date  . Cervical cancer [facility]) dx 01-31-2016--  oncologist-  dr livesay/  dr Sondra Come   FIGO Stage IIB, Invasive poorly differeniated sqaumous cell carcinoma, involiving bilateral external iliac nodes mets/  current treatment chemotherapy and radiation therapy  . History of bladder cancer urologist-  dr Karsten Ro   per path. 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 History of rib fracture    left side  . Hypertension   . Iron deficiency anemia   . Lower urinary tract symptoms (LUTS)   . Osteopenia   . Wears glasses     Past Surgical History:  Procedure Laterality Date  . CATARACT EXTRACTION W/ INTRAOCULAR LENS IMPLANT Right 10/2015  . COLONOSCOPY  2016  . CYSTOSCOPY WITH BIOPSY N/A 02/16/2016   Procedure: CYSTOSCOPY WITH  BLADDER BIOPSY;  Surgeon: [name], MD;  Location: [facility];  Service: Urology;  Laterality: N/A;  . ORIF RIGHT ANKLE FX  2006   retined hardware  . TANDEM RING INSERTION N/A 04/11/2016   Procedure: TANDEM RING INSERTION;  Surgeon: Gery Pray, MD;  [facility];  Service: Urology;  Laterality: N/A;  . TOTAL HIP ARTHROPLASTY Left 12/28/2014   Procedure: TOTAL HIP ARTHROPLASTY ANTERIOR APPROACH;  Surgeon: Leandrew Koyanagi, MD;  Location: [facility];  Service: Orthopedics;  Laterality: Left;  femur fracture    There were no vitals filed for this visit.      Subjective Assessment - 12/09/16 1311    Subjective My legs hurt from the neuropathy.   Patient is accompained by: Family member   Pertinent History cervical CA 2017 had radiation and chemo (PET scan on 21st);  neuropathy and fecal/urinary incontinence;  left hip fx 2016;  osteopenia;  had 3 PT treatments in [place] for lymphedema   Limitations House hold activities;Walking   How long can you sit comfortably? less pain with sitting   How long can you walk comfortably? with walker (all over the house)    Diagnostic tests PET scans   Patient Stated Goals walk unassisted, increase control of urine and stool leakage   Currently in Pain? Yes   Pain Location Leg   Pain Orientation Right;Left   Pain Descriptors / Indicators Burning;Tingling;Restless;Pins and needles   Aggravating Factors  walking   Pain Relieving Factors NWB   Multiple Pain Sites No                      Pelvic Floor Special Questions - 12/09/16 0001    Pelvic Floor Internal Exam Patient confirms identifcation and approves PT to assess pelvic floor muscles   Exam Type Vaginal  [facility] Adult PT Treatment/Exercise - 12/09/16 0001      Lumbar Exercises: Supine   Bridge 20 reps;2 seconds     Knee/Hip Exercises: Aerobic   Stationary Bike Nustep L2 x 10 minutes  Therapist present to discuss treatment     Knee/Hip Exercises: Seated   Long Arc Quad Strengthening;Both;2 sets;10 reps;Weights   Long Arc Quad Weight 2 lbs.  Ball squeeze to initiate ex   [employer] x20 with core and glute activation  VC to contract pelvic floor front and back   Knee/Hip Flexion 10x   VC to maintain good posture with pelvic floor lift throughou   Sit to Sand 1 set;10 reps;with UE support  2x5 VC for safety                 PT Education - 12/09/16 1237    Education provided Yes   Education Details pelvic floor exercise   Person(s) Educated Patient   Methods Explanation;Demonstration;Verbal cues;Handout   Comprehension Returned demonstration;Verbalized understanding          PT Short Term Goals - 12/09/16 1347      PT SHORT TERM GOAL #1   Title be independent in initial HEP for LE strengthening 12/06/16   Time 4   Period Weeks   Status On-going           PT Long Term Goals - 11/26/16 1945      PT LONG TERM GOAL #1   Title be independent in progressive HEP for further improvements in ROM, strength and balance    01/03/17   Time 8   Period Weeks   Status On-going     PT LONG TERM GOAL #2   Title The patient will have improved core, hip and knee strength to grossly 4-/5 needed for stability with walking, 2 steps to enter home, sit to stand   Time 8   Period Weeks   Status On-going     PT LONG TERM GOAL #4   Title perform community ambulation, going to restaurants, play cards at friends homes with 25% less discomfort in her legs   Time 8   Period Weeks   Status On-going     PT LONG TERM GOAL #5   Title demonstrate 4+/5 Lt hip strength to improve endurance and safety with gait   Time 8   Status On-going     PT LONG TERM GOAL #6   Title urinary leakage decreased when going from sit to stand >/= 75% so she only has to wear 1 pad per day   Time 8   Period Weeks   Status On-going     PT LONG TERM GOAL #7   Title fecal leakage decreased >/ 75% due to ability to control her diarrhea with diet, abdominal massage, and increased in pelvic floor strength >/= 3/5   Time 8   Period Weeks   Status On-going     PT LONG TERM GOAL #8   Title ability to go out and socialize 50%  more due to improve continence    Time 8   Period Weeks   Status On-going               Plan - 12/09/16 1313    Clinical Impression Statement Patient able to tolerate greater load of  exercise today without increasing pain/neuropathy.    PT Treatment/Interventions ADLs/Self Care Home Management;Cryotherapy;Electrical Stimulation;Gait training;Neuromuscular re-education;Therapeutic exercise;Therapeutic activities;Balance training;Patient/family education;Manual techniques;Biofeedback;Passive range of motion   PT Next Visit Plan  continue with soft tissue work to the pelvic floor muscles and increasing strength   PT Home Exercise Plan progress as needed   Consulted and Agree with Plan of Care Patient      Patient will benefit from skilled therapeutic intervention in order to improve the following deficits and impairments:     Visit Diagnosis: Muscle weakness (generalized)  Pain in left lower leg  Pain in right lower leg  Unspecified lack of coordination     Problem List Patient Active Problem List   Diagnosis Date Noted  . Malignant neoplasm of endocervix ([place])   . Malignant neoplasm of lateral wall of urinary bladder ([place]) 03/16/2016  . Extravasation, infusion or chemotherapeutic agent 03/13/2016  . Postmenopausal vaginal bleeding 02/28/2016  . Other iron deficiency anemias 02/28/2016  . Malignant neoplasm of exocervix ([place]) 01/31/2016  . Fracture of femoral neck, left ([place]) 12/28/2014  . Leukocytosis   . Elevated blood pressure     Lennex Pietila, PTA 12/09/2016, 1:49 PM  [facility] [address], [place], [place], [address] Phone: [phone]   Fax:  [phone]  Name: Sherri Mendez MRN: [id number] Date of Birth: Sep 25, 1935

## 2016-12-09 NOTE — Patient Instructions (Addendum)
Slow Contraction: Gravity Eliminated (Hook-Lying)    Lie with hips and knees bent. Slowly squeeze pelvic floor for _5__ seconds. Rest for __5_ seconds. Repeat _5__ times. Do _4__ times a day.   Copyright  VHI. All rights reserved.  [facility] [address] [place], [place] [address] Phone # [phone] Fax [phone]

## 2016-12-11 ENCOUNTER — Ambulatory Visit: Payer: Commercial Managed Care - HMO | Admitting: Physical Therapy

## 2016-12-11 ENCOUNTER — Encounter: Payer: Self-pay | Admitting: Physical Therapy

## 2016-12-11 DIAGNOSIS — M79662 Pain in left lower leg: Secondary | ICD-10-CM

## 2016-12-11 DIAGNOSIS — M6281 Muscle weakness (generalized): Secondary | ICD-10-CM | POA: Diagnosis not present

## 2016-12-11 DIAGNOSIS — M79661 Pain in right lower leg: Secondary | ICD-10-CM

## 2016-12-11 DIAGNOSIS — R269 Unspecified abnormalities of gait and mobility: Secondary | ICD-10-CM

## 2016-12-11 DIAGNOSIS — R2689 Other abnormalities of gait and mobility: Secondary | ICD-10-CM

## 2016-12-11 DIAGNOSIS — R279 Unspecified lack of coordination: Secondary | ICD-10-CM

## 2016-12-11 DIAGNOSIS — Z483 Aftercare following surgery for neoplasm: Secondary | ICD-10-CM

## 2016-12-11 DIAGNOSIS — R29898 Other symptoms and signs involving the musculoskeletal system: Secondary | ICD-10-CM

## 2016-12-11 NOTE — Therapy (Signed)
[facility] [address], [place], [place], [address] Phone: [phone]   Fax:  [phone]  Physical Therapy Treatment  Patient Details  Name: Sherri Mendez MRN: [id number] Date of Birth: 06/25/1936 Referring Provider: Dr. Quentin Cornwall -Jump  Encounter Date: 12/11/2016      PT End of Session - 12/11/16 1232    Visit Number 9   Number of Visits 10   Date for PT Re-Evaluation 01/03/17   Authorization Type Medicare G codes;  KX at visit 11 (patient had 3 visits for Lymphedema in [place])   PT Start Time 1231   PT Stop Time 1311   PT Time Calculation (min) 40 min   Activity Tolerance Patient tolerated treatment well   Behavior During Therapy [facility] for tasks assessed/performed      Past Medical History:  Diagnosis Date  . Cervical cancer [facility]) dx 01-31-2016--  oncologist-  dr livesay/  dr Sondra Come   FIGO Stage IIB, Invasive poorly differeniated sqaumous cell carcinoma, involiving bilateral external iliac nodes mets/  current treatment chemotherapy and radiation therapy  . History of bladder cancer urologist-  dr Karsten Ro   per path. 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 History of rib fracture    left side  . Hypertension   . Iron deficiency anemia   . Lower urinary tract symptoms (LUTS)   . Osteopenia   . Wears glasses     Past Surgical History:  Procedure Laterality Date  . CATARACT EXTRACTION W/ INTRAOCULAR LENS IMPLANT Right 10/2015  . COLONOSCOPY  2016  . CYSTOSCOPY WITH BIOPSY N/A 02/16/2016   Procedure: CYSTOSCOPY WITH  BLADDER BIOPSY;  Surgeon: [name], MD;  Location: [facility];  Service: Urology;  Laterality: N/A;  . ORIF RIGHT ANKLE FX  2006   retined hardware  . TANDEM RING INSERTION N/A 04/11/2016   Procedure: TANDEM RING INSERTION;  Surgeon: Gery Pray, MD;  Location: [facility];  Service: Urology;  Laterality:  N/A;  . TOTAL HIP ARTHROPLASTY Left 12/28/2014   Procedure: TOTAL HIP ARTHROPLASTY ANTERIOR APPROACH;  Surgeon: Leandrew Koyanagi, MD;  Location: [facility];  Service: Orthopedics;  Laterality: Left;  femur fracture    There were no vitals filed for this visit.      Subjective Assessment - 12/11/16 1235    Subjective Nerve pain is high today, but I am doing ok.    Patient is accompained by: Family member   Pertinent History cervical CA 2017 had radiation and chemo (PET scan on 21st);  neuropathy and fecal/urinary incontinence;  left hip fx 2016;  osteopenia;  had 3 PT treatments in [place] for lymphedema   Limitations House hold activities;Walking   How long can you sit comfortably? less pain with sitting   How long can you walk comfortably? with walker (all over the house)    Diagnostic tests PET scans   Patient Stated Goals walk unassisted, increase control of urine and stool leakage   Currently in Pain? --  Nerve pain in legs continues: 6/10                         [facility] Adult PT Treatment/Exercise - 12/11/16 0001      High Level Balance   High Level Balance Activities Other (comment)   High Level Balance Comments Standing and reaching outside BOS   Standing balance with head turns: MAx assistance to  stabilze     Lumbar Exercises: Supine   Bridge 2 seconds  3 x10 with ball squeeze, then repeat with red band hip abd     Knee/Hip Exercises: Aerobic   Stationary Bike L2 x 10 min Nustep                  PT Short Term Goals - 12/09/16 1347      PT SHORT TERM GOAL #1   Title be independent in initial HEP for LE strengthening 12/06/16   Time 4   Period Weeks   Status On-going           PT Long Term Goals - 11/26/16 1945      PT LONG TERM GOAL #1   Title be independent in progressive HEP for further improvements in ROM, strength and balance    01/03/17   Time 8   Period Weeks   Status On-going     PT LONG TERM GOAL #2   Title The patient will  have improved core, hip and knee strength to grossly 4-/5 needed for stability with walking, 2 steps to enter home, sit to stand   Time 8   Period Weeks   Status On-going     PT LONG TERM GOAL #4   Title perform community ambulation, going to restaurants, play cards at friends homes with 25% less discomfort in her legs   Time 8   Period Weeks   Status On-going     PT LONG TERM GOAL #5   Title demonstrate 4+/5 Lt hip strength to improve endurance and safety with gait   Time 8   Status On-going     PT LONG TERM GOAL #6   Title urinary leakage decreased when going from sit to stand >/= 75% so she only has to wear 1 pad per day   Time 8   Period Weeks   Status On-going     PT LONG TERM GOAL #7   Title fecal leakage decreased >/ 75% due to ability to control her diarrhea with diet, abdominal massage, and increased in pelvic floor strength >/= 3/5   Time 8   Period Weeks   Status On-going     PT LONG TERM GOAL #8   Title ability to go out and socialize 50%  more due to improve continence    Time 8   Period Weeks   Status On-going               Plan - 12/11/16 1234    Clinical Impression Statement Pt requires max assistance to stabilize at the pelvis in standing when performing reaching activity.    Rehab Potential Good   Clinical Impairments Affecting Rehab Potential risk of falls, CA no U/S;  lymphedema LE;  hip fx 2016   PT Frequency 2x / week   PT Duration 8 weeks   PT Treatment/Interventions ADLs/Self Care Home Management;Cryotherapy;Electrical Stimulation;Gait training;Neuromuscular re-education;Therapeutic exercise;Therapeutic activities;Balance training;Patient/family education;Manual techniques;Biofeedback;Passive range of motion   PT Next Visit Plan continue with soft tissue work to the pelvic floor muscles and increasing strength   Consulted and Agree with Plan of Care Patient      Patient will benefit from skilled therapeutic intervention in order to  improve the following deficits and impairments:  Pain, Decreased activity tolerance, Abnormal gait, Decreased balance, Decreased strength, Decreased range of motion, Difficulty walking, Increased fascial restricitons, Impaired flexibility  Visit Diagnosis: Muscle weakness (generalized)  Pain in left lower leg  Pain in  right lower leg  Unspecified lack of coordination  Aftercare following surgery for neoplasm  Other abnormalities of gait and mobility  Weakness of left hip  Abnormality of gait     Problem List Patient Active Problem List   Diagnosis Date Noted  . Malignant neoplasm of endocervix ([place])   . Malignant neoplasm of lateral wall of urinary bladder ([place]) 03/16/2016  . Extravasation, infusion or chemotherapeutic agent 03/13/2016  . Postmenopausal vaginal bleeding 02/28/2016  . Other iron deficiency anemias 02/28/2016  . Malignant neoplasm of exocervix ([place]) 01/31/2016  . Fracture of femoral neck, left ([place]) 12/28/2014  . Leukocytosis   . Elevated blood pressure     Jadeyn Hargett, PTA 12/11/2016, 1:13 PM  [facility] [address], [place], [place], [address] Phone: [phone]   Fax:  [phone]  Name: Sherri Mendez MRN: [id number] Date of Birth: 03-10-1936

## 2016-12-16 ENCOUNTER — Encounter: Payer: Commercial Managed Care - HMO | Admitting: Physical Therapy

## 2016-12-18 ENCOUNTER — Ambulatory Visit: Payer: Commercial Managed Care - HMO | Admitting: Physical Therapy

## 2016-12-18 ENCOUNTER — Encounter: Payer: Self-pay | Admitting: Physical Therapy

## 2016-12-18 DIAGNOSIS — M79662 Pain in left lower leg: Secondary | ICD-10-CM

## 2016-12-18 DIAGNOSIS — Z483 Aftercare following surgery for neoplasm: Secondary | ICD-10-CM

## 2016-12-18 DIAGNOSIS — R279 Unspecified lack of coordination: Secondary | ICD-10-CM

## 2016-12-18 DIAGNOSIS — R29898 Other symptoms and signs involving the musculoskeletal system: Secondary | ICD-10-CM

## 2016-12-18 DIAGNOSIS — R269 Unspecified abnormalities of gait and mobility: Secondary | ICD-10-CM

## 2016-12-18 DIAGNOSIS — R2689 Other abnormalities of gait and mobility: Secondary | ICD-10-CM

## 2016-12-18 DIAGNOSIS — M6281 Muscle weakness (generalized): Secondary | ICD-10-CM | POA: Diagnosis not present

## 2016-12-18 DIAGNOSIS — M79661 Pain in right lower leg: Secondary | ICD-10-CM

## 2016-12-18 NOTE — Therapy (Signed)
[facility] [address], [place], [place], [address] Phone: [phone]   Fax:  [phone]  Physical Therapy Treatment  Patient Details  Name: LUBERTHA LEITE MRN: [id number] Date of Birth: June 05, 1936 Referring Provider: Dr. Quentin Cornwall -Jump  Encounter Date: 12/18/2016      PT End of Session - 12/18/16 1239    Visit Number 10   Number of Visits 10   Date for PT Re-Evaluation 01/03/17   Authorization Type Medicare G codes;  KX at visit 11 (patient had 3 visits for Lymphedema in [place])   PT Start Time 1231   PT Stop Time 1315   PT Time Calculation (min) 44 min   Activity Tolerance Patient tolerated treatment well   Behavior During Therapy [facility] for tasks assessed/performed      Past Medical History:  Diagnosis Date  . Cervical cancer [facility]) dx 01-31-2016--  oncologist-  dr livesay/  dr Sondra Come   FIGO Stage IIB, Invasive poorly differeniated sqaumous cell carcinoma, involiving bilateral external iliac nodes mets/  current treatment chemotherapy and radiation therapy  . History of bladder cancer urologist-  dr Karsten Ro   per path. 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 History of rib fracture    left side  . Hypertension   . Iron deficiency anemia   . Lower urinary tract symptoms (LUTS)   . Osteopenia   . Wears glasses     Past Surgical History:  Procedure Laterality Date  . CATARACT EXTRACTION W/ INTRAOCULAR LENS IMPLANT Right 10/2015  . COLONOSCOPY  2016  . CYSTOSCOPY WITH BIOPSY N/A 02/16/2016   Procedure: CYSTOSCOPY WITH  BLADDER BIOPSY;  Surgeon: [name], MD;  Location: [facility];  Service: Urology;  Laterality: N/A;  . ORIF RIGHT ANKLE FX  2006   retined hardware  . TANDEM RING INSERTION N/A 04/11/2016   Procedure: TANDEM RING INSERTION;  Surgeon: Gery Pray, MD;  Location: [facility];  Service: Urology;   Laterality: N/A;  . TOTAL HIP ARTHROPLASTY Left 12/28/2014   Procedure: TOTAL HIP ARTHROPLASTY ANTERIOR APPROACH;  Surgeon: Leandrew Koyanagi, MD;  Location: [facility];  Service: Orthopedics;  Laterality: Left;  femur fracture    There were no vitals filed for this visit.      Subjective Assessment - 12/18/16 1236    Subjective I am feeling wobbly today. MD trying out some new medicine for my neuropathy.   Patient is accompained by: Family member   Pertinent History cervical CA 2017 had radiation and chemo (PET scan on 21st);  neuropathy and fecal/urinary incontinence;  left hip fx 2016;  osteopenia;  had 3 PT treatments in [facility] for lymphedema   Currently in Pain? Yes   Pain Score 6    Pain Location Leg   Pain Orientation Right;Left   Pain Descriptors / Indicators --  Neuropathy pain   Aggravating Factors  walking   Pain Relieving Factors Not much   Multiple Pain Sites No            [facility] PT Assessment - 12/18/16 0001      Observation/Other Assessments   Focus on Therapeutic Outcomes (FOTO)  --     Berg Balance Test   Sit to Stand Able to stand  independently using hands   Standing Unsupported Able to stand 2 minutes with supervision   Sitting with Back Unsupported but Feet Supported on Floor or Stool Able to sit safely  and securely 2 minutes   Stand to Sit Controls descent by using hands   Transfers Able to transfer safely, definite need of hands   Standing Unsupported with Eyes Closed Needs help to keep from falling   Standing Ubsupported with Feet Together Needs help to attain position and unable to hold for 15 seconds   From Standing, Reach Forward with Outstretched Arm Loses balance while trying/requires external support   From Standing Position, Pick up Object from Floor Unable to try/needs assist to keep balance   From Standing Position, Turn to Look Behind Over each Shoulder Needs assist to keep from losing balance and falling   Turn 360 Degrees Needs assistance while  turning   Standing Unsupported, Alternately Place Feet on Step/Stool Needs assistance to keep from falling or unable to try   Standing Unsupported, One Foot in [employer] balance while stepping or standing   Standing on One Leg Tries to lift leg/unable to hold 3 seconds but remains standing independently  On LT   Total Score 17                     [facility] Adult PT Treatment/Exercise - 12/18/16 0001      Knee/Hip Exercises: Aerobic   Stationary Bike L2 x 10 min Nustep     Knee/Hip Exercises: Standing   Hip Abduction Both;2 sets;10 reps;Knee straight  Close CGA     Knee/Hip Exercises: Seated   Long Arc Quad Strengthening;Both;2 sets;10 reps;Weights   Long Arc Quad Weight 2 lbs.  VC to slow speed, full ext.   Clamshell with TheraBand Red  3 x10   Knee/Hip Flexion 20x 2#                   PT Short Term Goals - 12/09/16 1347      PT SHORT TERM GOAL #1   Title be independent in initial HEP for LE strengthening 12/06/16   Time 4   Period Weeks   Status On-going           PT Long Term Goals - 11/26/16 1945      PT LONG TERM GOAL #1   Title be independent in progressive HEP for further improvements in ROM, strength and balance    01/03/17   Time 8   Period Weeks   Status On-going     PT LONG TERM GOAL #2   Title The patient will have improved core, hip and knee strength to grossly 4-/5 needed for stability with walking, 2 steps to enter home, sit to stand   Time 8   Period Weeks   Status On-going     PT LONG TERM GOAL #4   Title perform community ambulation, going to restaurants, play cards at friends homes with 25% less discomfort in her legs   Time 8   Period Weeks   Status On-going     PT LONG TERM GOAL #5   Title demonstrate 4+/5 Lt hip strength to improve endurance and safety with gait   Time 8   Status On-going     PT LONG TERM GOAL #6   Title urinary leakage decreased when going from sit to stand >/= 75% so she only has to wear 1 pad  per day   Time 8   Period Weeks   Status On-going     PT LONG TERM GOAL #7   Title fecal leakage decreased >/ 75% due to ability to control her diarrhea with diet,  abdominal massage, and increased in pelvic floor strength >/= 3/5   Time 8   Period Weeks   Status On-going     PT LONG TERM GOAL #8   Title ability to go out and socialize 50%  more due to improve continence    Time 8   Period Weeks   Status On-going               Plan - 01-Jan-2017 1239    Clinical Impression Statement Pt scored 17 pointson her BERG and FOTO score is in chart. Pt still requires max assist to maintain standing balance without UE on a stable surface. LE fatigued at end of session.    Rehab Potential Good   Clinical Impairments Affecting Rehab Potential risk of falls, CA no U/S;  lymphedema LE;  hip fx 2016   PT Frequency 2x / week   PT Duration 8 weeks   PT Treatment/Interventions ADLs/Self Care Home Management;Cryotherapy;Electrical Stimulation;Gait training;Neuromuscular re-education;Therapeutic exercise;Therapeutic activities;Balance training;Patient/family education;Manual techniques;Biofeedback;Passive range of motion   PT Next Visit Plan continue with soft tissue work to the pelvic floor muscles and increasing strength   Consulted and Agree with Plan of Care --      Patient will benefit from skilled therapeutic intervention in order to improve the following deficits and impairments:  Pain, Decreased activity tolerance, Abnormal gait, Decreased balance, Decreased strength, Decreased range of motion, Difficulty walking, Increased fascial restricitons, Impaired flexibility  Visit Diagnosis: Muscle weakness (generalized)  Pain in left lower leg  Pain in right lower leg  Unspecified lack of coordination  Aftercare following surgery for neoplasm  Other abnormalities of gait and mobility  Weakness of left hip  Abnormality of gait       G-Codes - 01-01-2017 1322    Functional  Assessment Tool Used (Outpatient Only) Berg balance score is 17/56 indicating CL 60-70% impaired   Functional Limitation Mobility: Walking and moving around   Mobility: Walking and Moving Around Current Status [phone]) At least 60 percent but less than 80 percent impaired, limited or restricted   Mobility: Walking and Moving Around Goal Status [phone]) At least 40 percent but less than 60 percent impaired, limited or restricted      Problem List Patient Active Problem List   Diagnosis Date Noted  . Malignant neoplasm of endocervix ([place])   . Malignant neoplasm of lateral wall of urinary bladder ([place]) 03/16/2016  . Extravasation, infusion or chemotherapeutic agent 03/13/2016  . Postmenopausal vaginal bleeding 02/28/2016  . Other iron deficiency anemias 02/28/2016  . Malignant neoplasm of exocervix ([place]) 01/31/2016  . Fracture of femoral neck, left ([place]) 12/28/2014  . Leukocytosis   . Elevated blood pressure     Myrene Galas, PTA 2017-01-01 1:59 PM  [facility] [address], [place], [place], [address] Phone: [phone]   Fax:  [phone]  Name: SERRIA SLOMA MRN: [id number] Date of Birth: Nov 08, 1935

## 2016-12-18 NOTE — Therapy (Signed)
[facility] [address] [place], [place], [address] Phone: [phone]   Fax:  [phone]  Physical Therapy Treatment  Patient Details  Name: Sherri Mendez MRN: [id number] Date of Birth: Jun 11, 1936 Referring Provider: Dr. Quentin Cornwall -Jump  Encounter Date: 12/18/2016      PT End of Session - 12/18/16 1239    Visit Number 10   Number of Visits 10   Date for PT Re-Evaluation 01/03/17   Authorization Type Medicare G codes;  KX at visit 11 (patient had 3 visits for Lymphedema in [place])   PT Start Time 1231   PT Stop Time 1315   PT Time Calculation (min) 44 min   Activity Tolerance Patient tolerated treatment well   Behavior During Therapy [facility] for tasks assessed/performed      Past Medical History:  Diagnosis Date  . Cervical cancer [facility]) dx 01-31-2016--  oncologist-  dr livesay/  dr Sondra Come   FIGO Stage IIB, Invasive poorly differeniated sqaumous cell carcinoma, involiving bilateral external iliac nodes mets/  current treatment chemotherapy and radiation therapy  . History of bladder cancer urologist-  dr Karsten Ro   per path. 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 History of rib fracture    left side  . Hypertension   . Iron deficiency anemia   . Lower urinary tract symptoms (LUTS)   . Osteopenia   . Wears glasses     Past Surgical History:  Procedure Laterality Date  . CATARACT EXTRACTION W/ INTRAOCULAR LENS IMPLANT Right 10/2015  . COLONOSCOPY  2016  . CYSTOSCOPY WITH BIOPSY N/A 02/16/2016   Procedure: CYSTOSCOPY WITH  BLADDER BIOPSY;  Surgeon: [name], MD;  Location: [facility];  Service: Urology;  Laterality: N/A;  . ORIF RIGHT ANKLE FX  2006   retined hardware  . TANDEM RING INSERTION N/A 04/11/2016   Procedure: TANDEM RING INSERTION;  Surgeon: Gery Pray, MD;  Location: [facility];  Service: Urology;   Laterality: N/A;  . TOTAL HIP ARTHROPLASTY Left 12/28/2014   Procedure: TOTAL HIP ARTHROPLASTY ANTERIOR APPROACH;  Surgeon: Leandrew Koyanagi, MD;  Location: [place];  Service: Orthopedics;  Laterality: Left;  femur fracture    There were no vitals filed for this visit.      Subjective Assessment - 12/18/16 1236    Subjective I am feeling wobbly today. MD trying out some new medicine for my neuropathy.   Patient is accompained by: Family member   Pertinent History cervical CA 2017 had radiation and chemo (PET scan on 21st);  neuropathy and fecal/urinary incontinence;  left hip fx 2016;  osteopenia;  had 3 PT treatments in [facility] for lymphedema   Currently in Pain? Yes   Pain Score 6    Pain Location Leg   Pain Orientation Right;Left   Pain Descriptors / Indicators --  Neuropathy pain   Aggravating Factors  walking   Pain Relieving Factors Not much   Multiple Pain Sites No            [facility] PT Assessment - 12/18/16 0001      Observation/Other Assessments   Focus on Therapeutic Outcomes (FOTO)  --     Berg Balance Test   Sit to Stand Able to stand  independently using hands   Standing Unsupported Able to stand 2 minutes with supervision   Sitting with Back Unsupported but Feet Supported on Floor or Stool Able to sit safely  and securely 2 minutes   Stand to Sit Controls descent by using hands   Transfers Able to transfer safely, definite need of hands   Standing Unsupported with Eyes Closed Needs help to keep from falling   Standing Ubsupported with Feet Together Needs help to attain position and unable to hold for 15 seconds   From Standing, Reach Forward with Outstretched Arm Loses balance while trying/requires external support   From Standing Position, Pick up Object from Floor Unable to try/needs assist to keep balance   From Standing Position, Turn to Look Behind Over each Shoulder Needs assist to keep from losing balance and falling   Turn 360 Degrees Needs assistance while  turning   Standing Unsupported, Alternately Place Feet on Step/Stool Needs assistance to keep from falling or unable to try   Standing Unsupported, One Foot in [employer] balance while stepping or standing   Standing on One Leg Tries to lift leg/unable to hold 3 seconds but remains standing independently  On LT   Total Score 17                     [facility] Adult PT Treatment/Exercise - 12/18/16 0001      Knee/Hip Exercises: Aerobic   Stationary Bike L2 x 10 min Nustep     Knee/Hip Exercises: Standing   Hip Abduction Both;2 sets;10 reps;Knee straight  Close CGA     Knee/Hip Exercises: Seated   Long Arc Quad Strengthening;Both;2 sets;10 reps;Weights   Long Arc Quad Weight 2 lbs.  VC to slow speed, full ext.   Clamshell with TheraBand Red  3 x10   Knee/Hip Flexion 20x 2#                   PT Short Term Goals - 12/09/16 1347      PT SHORT TERM GOAL #1   Title be independent in initial HEP for LE strengthening 12/06/16   Time 4   Period Weeks   Status On-going           PT Long Term Goals - 11/26/16 1945      PT LONG TERM GOAL #1   Title be independent in progressive HEP for further improvements in ROM, strength and balance    01/03/17   Time 8   Period Weeks   Status On-going     PT LONG TERM GOAL #2   Title The patient will have improved core, hip and knee strength to grossly 4-/5 needed for stability with walking, 2 steps to enter home, sit to stand   Time 8   Period Weeks   Status On-going     PT LONG TERM GOAL #4   Title perform community ambulation, going to restaurants, play cards at friends homes with 25% less discomfort in her legs   Time 8   Period Weeks   Status On-going     PT LONG TERM GOAL #5   Title demonstrate 4+/5 Lt hip strength to improve endurance and safety with gait   Time 8   Status On-going     PT LONG TERM GOAL #6   Title urinary leakage decreased when going from sit to stand >/= 75% so she only has to wear 1 pad  per day   Time 8   Period Weeks   Status On-going     PT LONG TERM GOAL #7   Title fecal leakage decreased >/ 75% due to ability to control her diarrhea with diet,  abdominal massage, and increased in pelvic floor strength >/= 3/5   Time 8   Period Weeks   Status On-going     PT LONG TERM GOAL #8   Title ability to go out and socialize 50%  more due to improve continence    Time 8   Period Weeks   Status On-going               Plan - 01/04/2017 1239    Clinical Impression Statement Pt scored 17 pointson her BERG and FOTO score is in chart. Pt still requires max assist to maintain standing balance without UE on a stable surface. LE fatigued at end of session.    Rehab Potential Good   Clinical Impairments Affecting Rehab Potential risk of falls, CA no U/S;  lymphedema LE;  hip fx 2016   PT Frequency 2x / week   PT Duration 8 weeks   PT Treatment/Interventions ADLs/Self Care Home Management;Cryotherapy;Electrical Stimulation;Gait training;Neuromuscular re-education;Therapeutic exercise;Therapeutic activities;Balance training;Patient/family education;Manual techniques;Biofeedback;Passive range of motion   PT Next Visit Plan continue with soft tissue work to the pelvic floor muscles and increasing strength   Consulted and Agree with Plan of Care --      Patient will benefit from skilled therapeutic intervention in order to improve the following deficits and impairments:  Pain, Decreased activity tolerance, Abnormal gait, Decreased balance, Decreased strength, Decreased range of motion, Difficulty walking, Increased fascial restricitons, Impaired flexibility  Visit Diagnosis: Muscle weakness (generalized)  Pain in left lower leg  Pain in right lower leg  Unspecified lack of coordination  Aftercare following surgery for neoplasm  Other abnormalities of gait and mobility  Weakness of left hip  Abnormality of gait       G-Codes - Jan 04, 2017 1322    Functional  Assessment Tool Used (Outpatient Only) Berg balance score is 17/56 indicating CL 60-70% impaired   Functional Limitation Mobility: Walking and moving around   Mobility: Walking and Moving Around Current Status [phone]) At least 60 percent but less than 80 percent impaired, limited or restricted   Mobility: Walking and Moving Around Goal Status [phone]) At least 40 percent but less than 60 percent impaired, limited or restricted      Problem List Patient Active Problem List   Diagnosis Date Noted  . Malignant neoplasm of endocervix ([place])   . Malignant neoplasm of lateral wall of urinary bladder ([place]) 03/16/2016  . Extravasation, infusion or chemotherapeutic agent 03/13/2016  . Postmenopausal vaginal bleeding 02/28/2016  . Other iron deficiency anemias 02/28/2016  . Malignant neoplasm of exocervix ([place]) 01/31/2016  . Fracture of femoral neck, left ([place]) 12/28/2014  . Leukocytosis   . Elevated blood pressure     Earlie Counts, PT 01-04-17 1:24 PM   [facility] [address] [place], [place], [address] Phone: [phone]   Fax:  [phone]  Name: Sherri Mendez MRN: [id number] Date of Birth: [birth year]

## 2016-12-25 ENCOUNTER — Encounter: Payer: Self-pay | Admitting: Physical Therapy

## 2016-12-25 ENCOUNTER — Ambulatory Visit: Payer: Commercial Managed Care - HMO

## 2016-12-25 ENCOUNTER — Ambulatory Visit: Payer: Commercial Managed Care - HMO | Admitting: Physical Therapy

## 2016-12-25 DIAGNOSIS — R29898 Other symptoms and signs involving the musculoskeletal system: Secondary | ICD-10-CM

## 2016-12-25 DIAGNOSIS — M6281 Muscle weakness (generalized): Secondary | ICD-10-CM | POA: Diagnosis not present

## 2016-12-25 DIAGNOSIS — M79661 Pain in right lower leg: Secondary | ICD-10-CM

## 2016-12-25 DIAGNOSIS — R2689 Other abnormalities of gait and mobility: Secondary | ICD-10-CM

## 2016-12-25 DIAGNOSIS — M79662 Pain in left lower leg: Secondary | ICD-10-CM

## 2016-12-25 DIAGNOSIS — R269 Unspecified abnormalities of gait and mobility: Secondary | ICD-10-CM

## 2016-12-25 DIAGNOSIS — R279 Unspecified lack of coordination: Secondary | ICD-10-CM

## 2016-12-25 NOTE — Therapy (Signed)
[facility] [address] [place], [place], [address] Phone: [phone]   Fax:  [phone]  Physical Therapy Treatment  Patient Details  Name: Sherri Mendez MRN: [id number] Date of Birth: 10/03/1935 Referring Provider: Dr. Quentin Cornwall Jump  Encounter Date: 12/25/2016      PT End of Session - 12/25/16 1245    Visit Number 12   Number of Visits 10   Date for PT Re-Evaluation 02/28/17   Authorization Type Medicare G codes;  KX at visit 11 (patient had 3 visits for Lymphedema in [place])   PT Start Time 1237   PT Stop Time 1315   PT Time Calculation (min) 38 min   Activity Tolerance Patient tolerated treatment well   Behavior During Therapy [facility] for tasks assessed/performed      Past Medical History:  Diagnosis Date  . Cervical cancer [facility]) dx 01-31-2016--  oncologist-  dr livesay/  dr Sondra Come   FIGO Stage IIB, Invasive poorly differeniated sqaumous cell carcinoma, involiving bilateral external iliac nodes mets/  current treatment chemotherapy and radiation therapy  . History of bladder cancer urologist-  dr Karsten Ro   per path. 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 History of rib fracture    left side  . Hypertension   . Iron deficiency anemia   . Lower urinary tract symptoms (LUTS)   . Osteopenia   . Wears glasses     Past Surgical History:  Procedure Laterality Date  . CATARACT EXTRACTION W/ INTRAOCULAR LENS IMPLANT Right 10/2015  . COLONOSCOPY  2016  . CYSTOSCOPY WITH BIOPSY N/A 02/16/2016   Procedure: CYSTOSCOPY WITH  BLADDER BIOPSY;  Surgeon: [name], MD;  Location: [facility];  Service: Urology;  Laterality: N/A;  . ORIF RIGHT ANKLE FX  2006   retined hardware  . TANDEM RING INSERTION N/A 04/11/2016   Procedure: TANDEM RING INSERTION;  Surgeon: Gery Pray, MD;  Location: [facility];  Service: Urology;  Laterality:  N/A;  . TOTAL HIP ARTHROPLASTY Left 12/28/2014   Procedure: TOTAL HIP ARTHROPLASTY ANTERIOR APPROACH;  Surgeon: Leandrew Koyanagi, MD;  Location: [place];  Service: Orthopedics;  Laterality: Left;  femur fracture    There were no vitals filed for this visit.      Subjective Assessment - 12/25/16 1325    Subjective Pt. doing well no complaint.    Patient Stated Goals walk unassisted, increase control of urine and stool leakage   Currently in Pain? No/denies   Pain Score 0-No pain   Multiple Pain Sites No                         [facility] Adult PT Treatment/Exercise - 12/25/16 1247      Ambulation/Gait   Ambulation Distance (Feet) 100 Feet   Assistive device 4-wheeled walker   Gait Pattern Poor foot clearance - right   Gait Comments Cueing for LE clearance, increased step length, and hand placement with transfer      Lumbar Exercises: Supine   Bridge 3 seconds;20 reps     Knee/Hip Exercises: Aerobic   Stationary Bike L2 x 10 min Nustep     Knee/Hip Exercises: Seated   Long Arc Quad Strengthening;Both;Weights;2 sets;15 reps   Long Arc Quad Weight 2 lbs.   Ball Squeeze x 15 x 5" hold   2 sets    Clamshell with TheraBand Green  x 15 reps  Knee/Hip Flexion 25x, 2#    Sit to Sand 10 reps;with UE support;2 sets     Knee/Hip Exercises: Supine   Bridges Limitations bridge x 15 reps    Bridges with Clamshell 15 reps;Both  with red TB                   PT Short Term Goals - 12/25/16 1154      PT SHORT TERM GOAL #1   Title be independent in initial HEP for LE strengthening 12/06/16   Time 4   Period Weeks   Status Achieved     PT SHORT TERM GOAL #2   Title The patient will be able to maintain standing balance with min perturbances (turning head, reaching) without loss of balance   Time 4   Period Weeks   Status On-going     PT SHORT TERM GOAL #3   Title The patient will express understanding of fall risk and strategies to reduce risk of falling  (lighting, remove throw rugs, continued full-time use of RW)   Time 4   Period Weeks   Status Achieved     PT SHORT TERM GOAL #4   Title ability to use the larger dilator to expand the vaginal canal and begin on muscle mobility   Time 4   Period Weeks   Status Achieved     PT SHORT TERM GOAL #5   Title urinary leakage decreased >/= 25% due to improved pelvic floor contraction   Time 4   Period Weeks   Status On-going  50% better           PT Long Term Goals - 12/25/16 1207      PT LONG TERM GOAL #1   Title be independent in progressive HEP for further improvements in ROM, strength and balance    01/03/17   Time 8   Period Weeks   Status On-going     PT LONG TERM GOAL #2   Title The patient will have improved core, hip and knee strength to grossly 4-/5 needed for stability with walking, 2 steps to enter home, sit to stand   Time 8   Period Weeks   Status On-going     PT LONG TERM GOAL #3   Title The patient will have improved BERG score to 26/56 indicating improved balance and reduced risk of falls   Baseline 17/56   Time 8   Period Weeks   Status On-going     PT LONG TERM GOAL #4   Title perform community ambulation, going to restaurants, play cards at friends homes with 25% less discomfort in her legs   Time 8   Period Weeks   Status On-going  10% better     PT LONG TERM GOAL #5   Title demonstrate 4+/5 Lt hip strength to improve endurance and safety with gait   Time 8   Period Weeks   Status On-going     PT LONG TERM GOAL #6   Title urinary leakage decreased when going from sit to stand >/= 75% so she only has to wear 1 pad per day   Time 8   Period Weeks   Status On-going  50% bettter     PT LONG TERM GOAL #7   Title fecal leakage decreased >/ 75% due to ability to control her diarrhea with diet, abdominal massage, and increased in pelvic floor strength >/= 3/5   Time 8   Period Weeks  Status Achieved     PT LONG TERM GOAL #8   Title ability to  go out and socialize 50%  more due to improve continence    Time 8   Period Weeks   Status On-going  25% better               Plan - 12/25/16 1246    Clinical Impression Statement Pt. doing well today no complaints.  Performed well with all hip/knee strengthening activity with sit<>stand strengthening focus.  Cueing with transfers for proper hand placement today.     PT Treatment/Interventions ADLs/Self Care Home Management;Cryotherapy;Electrical Stimulation;Gait training;Neuromuscular re-education;Therapeutic exercise;Therapeutic activities;Balance training;Patient/family education;Manual techniques;Biofeedback;Passive range of motion   PT Next Visit Plan continue with soft tissue work to the pelvic floor muscles and increasing strength      Patient will benefit from skilled therapeutic intervention in order to improve the following deficits and impairments:  Pain, Decreased activity tolerance, Abnormal gait, Decreased balance, Decreased strength, Decreased range of motion, Difficulty walking, Increased fascial restricitons, Impaired flexibility  Visit Diagnosis: Muscle weakness (generalized)  Pain in left lower leg  Pain in right lower leg  Other abnormalities of gait and mobility  Weakness of left hip  Abnormality of gait     Problem List Patient Active Problem List   Diagnosis Date Noted  . Malignant neoplasm of endocervix ([place])   . Malignant neoplasm of lateral wall of urinary bladder ([place]) 03/16/2016  . Extravasation, infusion or chemotherapeutic agent 03/13/2016  . Postmenopausal vaginal bleeding 02/28/2016  . Other iron deficiency anemias 02/28/2016  . Malignant neoplasm of exocervix ([place]) 01/31/2016  . Fracture of femoral neck, left ([place]) 12/28/2014  . Leukocytosis   . Elevated blood pressure     Bess Harvest, PTA 12/25/16 1:30 PM  [facility] [address] [place], [place],  [address] Phone: [phone]   Fax:  [phone]  Name: Sherri Mendez MRN: [id number] Date of Birth: [birth year]

## 2016-12-25 NOTE — Therapy (Signed)
[facility] [address] [place], [place], [address] Phone: [phone]   Fax:  [phone]  Physical Therapy Treatment  Patient Details  Name: Sherri Mendez MRN: [id number] Date of Birth: October 17, 1935 Referring Provider: Dr. Quentin Cornwall Jump  Encounter Date: 12/25/2016      PT End of Session - 12/25/16 1153    Visit Number 11   Number of Visits 10   Date for PT Re-Evaluation 02/28/17   Authorization Type Medicare G codes;  KX at visit 11 (patient had 3 visits for Lymphedema in [place])   PT Start Time 1151   PT Stop Time 1230   PT Time Calculation (min) 39 min   Activity Tolerance Patient tolerated treatment well   Behavior During Therapy [facility] for tasks assessed/performed      Past Medical History:  Diagnosis Date  . Cervical cancer [facility]) dx 01-31-2016--  oncologist-  dr livesay/  dr Sondra Come   FIGO Stage IIB, Invasive poorly differeniated sqaumous cell carcinoma, involiving bilateral external iliac nodes mets/  current treatment chemotherapy and radiation therapy  . History of bladder cancer urologist-  dr Karsten Ro   per path. 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 History of rib fracture    left side  . Hypertension   . Iron deficiency anemia   . Lower urinary tract symptoms (LUTS)   . Osteopenia   . Wears glasses     Past Surgical History:  Procedure Laterality Date  . CATARACT EXTRACTION W/ INTRAOCULAR LENS IMPLANT Right 10/2015  . COLONOSCOPY  2016  . CYSTOSCOPY WITH BIOPSY N/A 02/16/2016   Procedure: CYSTOSCOPY WITH  BLADDER BIOPSY;  Surgeon: [name], MD;  Location: [facility];  Service: Urology;  Laterality: N/A;  . ORIF RIGHT ANKLE FX  2006   retined hardware  . TANDEM RING INSERTION N/A 04/11/2016   Procedure: TANDEM RING INSERTION;  Surgeon: Gery Pray, MD;  Location: [facility];  Service: Urology;  Laterality:  N/A;  . TOTAL HIP ARTHROPLASTY Left 12/28/2014   Procedure: TOTAL HIP ARTHROPLASTY ANTERIOR APPROACH;  Surgeon: Leandrew Koyanagi, MD;  Location: [place];  Service: Orthopedics;  Laterality: Left;  femur fracture    There were no vitals filed for this visit.          [facility] PT Assessment - 12/25/16 0001      Assessment   Medical Diagnosis R26.81gait instability; N39.42 Urinary incontinence without sensory awareness   Referring Provider Dr. Quentin Cornwall Jump   Onset Date/Surgical Date 01/30/16   Prior Therapy none for pelvic floor     Precautions   Precautions Fall   Precaution Comments cancer precautions     Restrictions   Weight Bearing Restrictions No     Home Environment   Living Environment Private residence   Living Arrangements Alone   Available Help at Discharge Family;Friend(s)     Prior Function   Level of [place] Retired     Associate Professor   Overall Cognitive Status Within Functional Limits for tasks assessed     Posture/Postural Control   Posture/Postural Control No significant limitations     AROM   Right Ankle Dorsiflexion 0   Right Ankle Plantar Flexion 25   Right Ankle Inversion 0   Right Ankle Eversion 0   Left Ankle Dorsiflexion 3   Left Ankle Plantar Flexion 40   Left Ankle Inversion 5   Left Ankle Eversion  5     Strength   Right Hip Flexion 3/5   Right Hip Extension 3/5   Right Hip ABduction 3+/5   Right Hip ADduction 3+/5   Left Hip Flexion 4-/5   Left Hip Extension 3+/5   Left Hip ABduction 4-/5   Left Hip ADduction 4-/5   Right Knee Flexion 3+/5   Right Knee Extension 5/5   Left Knee Flexion 3/5   Left Knee Extension 4-/5   Right Ankle Dorsiflexion 1/5   Right Ankle Plantar Flexion 0/5   Right Ankle Inversion 1/5   Right Ankle Eversion 1/5   Left Ankle Dorsiflexion 3-/5   Left Ankle Plantar Flexion 3-/5   Left Ankle Inversion 3-/5   Left Ankle Eversion 3-/5   Lumbar Flexion 3-/5   Lumbar Extension 3-/5      Transfers   Transfers Not assessed     Ambulation/Gait   Ambulation Distance (Feet) 100 Feet   Assistive device 4-wheeled walker   Gait Pattern Poor foot clearance - right     Berg Balance Test   Sit to Stand Able to stand  independently using hands   Standing Unsupported Able to stand 2 minutes with supervision   Sitting with Back Unsupported but Feet Supported on Floor or Stool Able to sit safely and securely 2 minutes   Stand to Sit Controls descent by using hands   Transfers Able to transfer safely, definite need of hands   Standing Unsupported with Eyes Closed Needs help to keep from falling   Standing Ubsupported with Feet Together Needs help to attain position and unable to hold for 15 seconds   From Standing, Reach Forward with Outstretched Arm Loses balance while trying/requires external support   From Standing Position, Pick up Object from Floor Unable to try/needs assist to keep balance   From Standing Position, Turn to Look Behind Over each Shoulder Needs assist to keep from losing balance and falling   Turn 360 Degrees Needs assistance while turning   Standing Unsupported, Alternately Place Feet on Step/Stool Needs assistance to keep from falling or unable to try   Standing Unsupported, One Foot in [employer] balance while stepping or standing   Standing on One Leg Tries to lift leg/unable to hold 3 seconds but remains standing independently  On LT   Total Score 17                  Pelvic Floor Special Questions - 12/25/16 0001    Urinary Leakage Yes  50% better   Pad use depends 1  pads 3/day ( super pantyliner long)   Activities that cause leaking Other  sometimes at night   Other activities that cause leaking sit to stand;    Fecal incontinence No  none in a few weeks   Pelvic Floor Internal Exam Patient confirms identifcation and approves PT to assess pelvic floor muscles   Exam Type Vaginal   Palpation thickness located on te posterior left vaginal  canal   Strength weak squeeze, no lift           [facility] Adult PT Treatment/Exercise - 12/25/16 0001      Manual Therapy   Manual Therapy Internal Pelvic Floor   Manual therapy comments tactile cues to improve pelvic floor contraction and relaxation   Internal Pelvic Floor sides of the introitus, posterior left transverse friction massage                PT Education - 12/25/16 1225  Education provided Yes   Education Details pelvic floor tighten then relax; vaginal moisturizer   Person(s) Educated Patient   Methods Explanation;Demonstration;Verbal cues;Handout   Comprehension Verbalized understanding;Returned demonstration          PT Short Term Goals - 12/25/16 1154      PT SHORT TERM GOAL #1   Title be independent in initial HEP for LE strengthening 12/06/16   Time 4   Period Weeks   Status Achieved     PT SHORT TERM GOAL #2   Title The patient will be able to maintain standing balance with min perturbances (turning head, reaching) without loss of balance   Time 4   Period Weeks   Status On-going     PT SHORT TERM GOAL #3   Title The patient will express understanding of fall risk and strategies to reduce risk of falling (lighting, remove throw rugs, continued full-time use of RW)   Time 4   Period Weeks   Status Achieved     PT SHORT TERM GOAL #4   Title ability to use the larger dilator to expand the vaginal canal and begin on muscle mobility   Time 4   Period Weeks   Status Achieved     PT SHORT TERM GOAL #5   Title urinary leakage decreased >/= 25% due to improved pelvic floor contraction   Time 4   Period Weeks   Status On-going  50% better           PT Long Term Goals - 12/25/16 1207      PT LONG TERM GOAL #1   Title be independent in progressive HEP for further improvements in ROM, strength and balance    01/03/17   Time 8   Period Weeks   Status On-going     PT LONG TERM GOAL #2   Title The patient will have improved core, hip  and knee strength to grossly 4-/5 needed for stability with walking, 2 steps to enter home, sit to stand   Time 8   Period Weeks   Status On-going     PT LONG TERM GOAL #3   Title The patient will have improved BERG score to 26/56 indicating improved balance and reduced risk of falls   Baseline 17/56   Time 8   Period Weeks   Status On-going     PT LONG TERM GOAL #4   Title perform community ambulation, going to restaurants, play cards at friends homes with 25% less discomfort in her legs   Time 8   Period Weeks   Status On-going  10% better     PT LONG TERM GOAL #5   Title demonstrate 4+/5 Lt hip strength to improve endurance and safety with gait   Time 8   Period Weeks   Status On-going     PT LONG TERM GOAL #6   Title urinary leakage decreased when going from sit to stand >/= 75% so she only has to wear 1 pad per day   Time 8   Period Weeks   Status On-going  50% bettter     PT LONG TERM GOAL #7   Title fecal leakage decreased >/ 75% due to ability to control her diarrhea with diet, abdominal massage, and increased in pelvic floor strength >/= 3/5   Time 8   Period Weeks   Status Achieved     PT LONG TERM GOAL #8   Title ability to go out and socialize 50%  more due to improve continence    Time 8   Period Weeks   Status On-going  25% better               Plan - 12/25/16 1155    Clinical Impression Statement Urinary leakage is 50% better. Patient has improved pelvic floor contraction to 2/5 instead of 1/5.  Patient Sherri Mendez Balance score improved to 17/56.  Patient has 1/5 contraction with right ankle inversion and eversion.  Patient has an appointment to get a brace for her drop foot.  Patient will start chemotherapy every 3 weeks for spots in her lungs.  Patient is using her dilator without difficulty.  Patient has thickening of rht posterior left introitus.  Patient will benefit from skilled therapy to improve strength and  function while increasing her  endurance.    Rehab Potential Good   Clinical Impairments Affecting Rehab Potential risk of falls, CA no U/S;  lymphedema LE;  hip fx 2016   PT Frequency 2x / week   PT Duration 8 weeks   PT Treatment/Interventions ADLs/Self Care Home Management;Cryotherapy;Electrical Stimulation;Gait training;Neuromuscular re-education;Therapeutic exercise;Therapeutic activities;Balance training;Patient/family education;Manual techniques;Biofeedback;Passive range of motion   PT Next Visit Plan continue with soft tissue work to the pelvic floor muscles and increasing strength   PT Home Exercise Plan progress as needed   Consulted and Agree with Plan of Care Patient      Patient will benefit from skilled therapeutic intervention in order to improve the following deficits and impairments:  Pain, Decreased activity tolerance, Abnormal gait, Decreased balance, Decreased strength, Decreased range of motion, Difficulty walking, Increased fascial restricitons, Impaired flexibility  Visit Diagnosis: Muscle weakness (generalized) - Plan: PT plan of care cert/re-cert  Unspecified lack of coordination - Plan: PT plan of care cert/re-cert     Problem List Patient Active Problem List   Diagnosis Date Noted  . Malignant neoplasm of endocervix ([place])   . Malignant neoplasm of lateral wall of urinary bladder ([place]) 03/16/2016  . Extravasation, infusion or chemotherapeutic agent 03/13/2016  . Postmenopausal vaginal bleeding 02/28/2016  . Other iron deficiency anemias 02/28/2016  . Malignant neoplasm of exocervix ([place]) 01/31/2016  . Fracture of femoral neck, left ([place]) 12/28/2014  . Leukocytosis   . Elevated blood pressure     Earlie Counts, PT 12/25/16 12:37 PM    [facility] [address], [place], [place], [address] Phone: [phone]   Fax:  [phone]  Name: Sherri Mendez MRN: [id number] Date of Birth: 1936/02/25

## 2016-12-25 NOTE — Patient Instructions (Addendum)
Moisturizers . They are used in the vagina to hydrate the mucous membrane that make up the vaginal canal. . Designed to keep a more normal acid balance (ph) . Once placed in the vagina, it will last between two to three days.  . Use 2-3 times per week at bedtime and last longer than 60 min. . Ingredients to avoid is glycerin and fragrance, can increase chance of infection . Should not be used just before sex due to causing irritation . Most are gels administered either in a tampon-shaped applicator or as a vaginal suppository. They are non-hormonal.   Types of Moisturizers . Replens- drug store . Samul Dada- drug store . Vitamin E vaginal suppositories- Whole foods . Moist Again . Coconut oil- can break down condoms . [employer] . Throop . V-magic - cream external for the vulva area . Yes moisturizer- amazon  Things to avoid in the vaginal area . Do not use things to irritate the vulvar area . No lotions just specialized creams for the vulva area- Neogyn, V-magic, [place] . No soaps; can use Aveeno or Calendula cleanser if needed. Must be gentle . No deodorants . No douches . Good to sleep without underwear to let the vaginal area to air out . No scrubbing: spread the lips to let warm water rinse over labias and pat dry Bear Down    Exhaling, bear down as if to have a bowel movement. Then tighten up the pelvic floor.   Repeat _5__ times. Do _2__ times a day.  Copyright  VHI. All rights reserved.  [place] [address] [place], [place] [address] Phone # [phone] Fax [phone]

## 2016-12-27 ENCOUNTER — Ambulatory Visit: Payer: Commercial Managed Care - HMO | Admitting: Physical Therapy

## 2016-12-27 ENCOUNTER — Encounter: Payer: Self-pay | Admitting: Physical Therapy

## 2016-12-27 DIAGNOSIS — M79662 Pain in left lower leg: Secondary | ICD-10-CM

## 2016-12-27 DIAGNOSIS — M6281 Muscle weakness (generalized): Secondary | ICD-10-CM

## 2016-12-27 DIAGNOSIS — R269 Unspecified abnormalities of gait and mobility: Secondary | ICD-10-CM

## 2016-12-27 DIAGNOSIS — R29898 Other symptoms and signs involving the musculoskeletal system: Secondary | ICD-10-CM

## 2016-12-27 DIAGNOSIS — R2689 Other abnormalities of gait and mobility: Secondary | ICD-10-CM

## 2016-12-27 NOTE — Therapy (Addendum)
[facility] [address] [place], [place], [address] Phone: [phone]   Fax:  [phone]  Physical Therapy Treatment/Discharge Summary  Patient Details  Name: Sherri Mendez MRN: [id number] Date of Birth: 1935/09/19 Referring Provider: Dr. Quentin Cornwall Jump  Encounter Date: 12/27/2016      PT End of Session - 12/27/16 1058    Visit Number 13   Date for PT Re-Evaluation 02/28/17   Authorization Type Medicare G codes;  KX at visit 11 (patient had 3 visits for Lymphedema in [place])   PT Start Time 1052   PT Stop Time 1131   PT Time Calculation (min) 39 min   Activity Tolerance Patient tolerated treatment well   Behavior During Therapy [facility] for tasks assessed/performed      Past Medical History:  Diagnosis Date  . Cervical cancer [facility]) dx 01-31-2016--  oncologist-  dr livesay/  dr Sondra Come   FIGO Stage IIB, Invasive poorly differeniated sqaumous cell carcinoma, involiving bilateral external iliac nodes mets/  current treatment chemotherapy and radiation therapy  . History of bladder cancer urologist-  dr Karsten Ro   per path. 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 History of rib fracture    left side  . Hypertension   . Iron deficiency anemia   . Lower urinary tract symptoms (LUTS)   . Osteopenia   . Wears glasses     Past Surgical History:  Procedure Laterality Date  . CATARACT EXTRACTION W/ INTRAOCULAR LENS IMPLANT Right 10/2015  . COLONOSCOPY  2016  . CYSTOSCOPY WITH BIOPSY N/A 02/16/2016   Procedure: CYSTOSCOPY WITH  BLADDER BIOPSY;  Surgeon: [name], MD;  Location: [facility];  Service: Urology;  Laterality: N/A;  . ORIF RIGHT ANKLE FX  2006   retined hardware  . TANDEM RING INSERTION N/A 04/11/2016   Procedure: TANDEM RING INSERTION;  Surgeon: Gery Pray, MD;  Location: [facility];  Service: Urology;  Laterality: N/A;   . TOTAL HIP ARTHROPLASTY Left 12/28/2014   Procedure: TOTAL HIP ARTHROPLASTY ANTERIOR APPROACH;  Surgeon: Leandrew Koyanagi, MD;  Location: [place];  Service: Orthopedics;  Laterality: Left;  femur fracture    There were no vitals filed for this visit.      Subjective Assessment - 12/27/16 1100    Subjective Neurpathy pain is better, her new medicine might be helping.    Patient is accompained by: Family member   Pertinent History cervical CA 2017 had radiation and chemo (PET scan on 21st);  neuropathy and fecal/urinary incontinence;  left hip fx 2016;  osteopenia;  had 3 PT treatments in [facility] for lymphedema   How long can you sit comfortably? less pain with sitting   How long can you walk comfortably? with walker (all over the house)    Diagnostic tests PET scans   Patient Stated Goals walk unassisted, increase control of urine and stool leakage   Currently in Pain? --  Neuropathy in LE   Pain Score 5    Aggravating Factors  Nothing, just constant thing i deal with   Pain Relieving Factors Meds   Multiple Pain Sites No      g-code: Functional assessment tool used is Berg balance score is 17/56 indicating CL 60%-70% impaired; Functional limitation mobility walking and moving around; discharge goal is CK; discharge status is CL.  Earlie Counts, PT 02/28/17 11:51 AM  Sherri Mendez Adult PT Treatment/Exercise - 12/27/16 0001      Knee/Hip Exercises: Aerobic   Nustep L2 x 12 min     Knee/Hip Exercises: Seated   Long Arc Quad Strengthening;Both;2 sets;10 reps;Weights   Long Arc Quad Weight 2 lbs.   Ball Squeeze x 15 x 5" hold   2 sets    Clamshell with TheraBand Green  2x 15 reps    Knee/Hip Flexion 25x, 2#      Knee/Hip Exercises: Supine   Bridges Limitations bridge x 15 reps    Bridges with Clamshell 15 reps;Both  with red TB                   PT Short Term Goals - 12/25/16 1154      PT SHORT TERM GOAL #1   Title be independent in  initial HEP for LE strengthening 12/06/16   Time 4   Period Weeks   Status Achieved     PT SHORT TERM GOAL #2   Title The patient will be able to maintain standing balance with min perturbances (turning head, reaching) without loss of balance   Time 4   Period Weeks   Status On-going     PT SHORT TERM GOAL #3   Title The patient will express understanding of fall risk and strategies to reduce risk of falling (lighting, remove throw rugs, continued full-time use of RW)   Time 4   Period Weeks   Status Achieved     PT SHORT TERM GOAL #4   Title ability to use the larger dilator to expand the vaginal canal and begin on muscle mobility   Time 4   Period Weeks   Status Achieved     PT SHORT TERM GOAL #5   Title urinary leakage decreased >/= 25% due to improved pelvic floor contraction   Time 4   Period Weeks   Status On-going  50% better           PT Long Term Goals - 12/25/16 1207      PT LONG TERM GOAL #1   Title be independent in progressive HEP for further improvements in ROM, strength and balance    01/03/17   Time 8   Period Weeks   Status On-going     PT LONG TERM GOAL #2   Title The patient will have improved core, hip and knee strength to grossly 4-/5 needed for stability with walking, 2 steps to enter home, sit to stand   Time 8   Period Weeks   Status On-going     PT LONG TERM GOAL #3   Title The patient will have improved BERG score to 26/56 indicating improved balance and reduced risk of falls   Baseline 17/56   Time 8   Period Weeks   Status On-going     PT LONG TERM GOAL #4   Title perform community ambulation, going to restaurants, play cards at friends homes with 25% less discomfort in her legs   Time 8   Period Weeks   Status On-going  10% better     PT LONG TERM GOAL #5   Title demonstrate 4+/5 Lt hip strength to improve endurance and safety with gait   Time 8   Period Weeks   Status On-going     PT LONG TERM GOAL #6   Title urinary  leakage decreased when going from sit to stand >/= 75% so she only has to wear 1 pad  per day   Time 8   Period Weeks   Status On-going  50% bettter     PT LONG TERM GOAL #7   Title fecal leakage decreased >/ 75% due to ability to control her diarrhea with diet, abdominal massage, and increased in pelvic floor strength >/= 3/5   Time 8   Period Weeks   Status Achieved     PT LONG TERM GOAL #8   Title ability to go out and socialize 50%  more due to improve continence    Time 8   Period Weeks   Status On-going  25% better               Plan - 12/27/16 1059    Clinical Impression Statement Pt has appt with orthotist next week to get fitted for an AFO. otherwise she continues to do well with sitting exercises, some improved balance when standing but not able to reach outside her BOS much.    Rehab Potential Good   Clinical Impairments Affecting Rehab Potential risk of falls, CA no U/S;  lymphedema LE;  hip fx 2016   PT Frequency 2x / week   PT Duration 8 weeks   PT Treatment/Interventions ADLs/Self Care Home Management;Cryotherapy;Electrical Stimulation;Gait training;Neuromuscular re-education;Therapeutic exercise;Therapeutic activities;Balance training;Patient/family education;Manual techniques;Biofeedback;Passive range of motion   PT Next Visit Plan continue with soft tissue work to the pelvic floor muscles and increasing strength, pt to get fitted for AFO next week.    Consulted and Agree with Plan of Care Patient      Patient will benefit from skilled therapeutic intervention in order to improve the following deficits and impairments:  Pain, Decreased activity tolerance, Abnormal gait, Decreased balance, Decreased strength, Decreased range of motion, Difficulty walking, Increased fascial restricitons, Impaired flexibility  Visit Diagnosis: Muscle weakness (generalized)  Pain in left lower leg  Other abnormalities of gait and mobility  Weakness of left  hip  Abnormality of gait     Problem List Patient Active Problem List   Diagnosis Date Noted  . Malignant neoplasm of endocervix ([place])   . Malignant neoplasm of lateral wall of urinary bladder ([place]) 03/16/2016  . Extravasation, infusion or chemotherapeutic agent 03/13/2016  . Postmenopausal vaginal bleeding 02/28/2016  . Other iron deficiency anemias 02/28/2016  . Malignant neoplasm of exocervix ([place]) 01/31/2016  . Fracture of femoral neck, left ([place]) 12/28/2014  . Leukocytosis   . Elevated blood pressure     Sherri Mendez,Sherri Mendez, PTA 12/27/2016, 11:36 AM  [facility] [address], [place], [place], [address] Phone: [phone]   Fax:  [phone]  Name: Sherri Mendez MRN: [id number] Date of Birth: 05/30/1936  PHYSICAL THERAPY DISCHARGE SUMMARY  Visits from Start of Care: 13  Current functional level related to goals / functional outcomes: See above.    Remaining deficits: See above   Education / Equipment: HEP  Plan: Patient agrees to discharge.  Patient goals were not met. Patient is being discharged due to not returning since the last visit.  Thank you for the referral. Earlie Counts, PT 02/28/17 11:52 AM  ?????

## 2017-04-01 DIAGNOSIS — C530 Malignant neoplasm of endocervix: Secondary | ICD-10-CM | POA: Diagnosis not present

## 2017-04-01 DIAGNOSIS — C539 Malignant neoplasm of cervix uteri, unspecified: Secondary | ICD-10-CM | POA: Diagnosis not present

## 2017-04-03 DIAGNOSIS — C539 Malignant neoplasm of cervix uteri, unspecified: Secondary | ICD-10-CM | POA: Diagnosis not present

## 2017-04-03 DIAGNOSIS — Z5112 Encounter for antineoplastic immunotherapy: Secondary | ICD-10-CM | POA: Diagnosis not present

## 2017-04-22 DIAGNOSIS — C539 Malignant neoplasm of cervix uteri, unspecified: Secondary | ICD-10-CM | POA: Diagnosis not present

## 2017-04-22 DIAGNOSIS — C530 Malignant neoplasm of endocervix: Secondary | ICD-10-CM | POA: Diagnosis not present

## 2017-04-28 DIAGNOSIS — Z9225 Personal history of immunosupression therapy: Secondary | ICD-10-CM | POA: Diagnosis not present

## 2017-04-28 DIAGNOSIS — C539 Malignant neoplasm of cervix uteri, unspecified: Secondary | ICD-10-CM | POA: Diagnosis not present

## 2017-04-28 DIAGNOSIS — R159 Full incontinence of feces: Secondary | ICD-10-CM | POA: Diagnosis not present

## 2017-04-28 DIAGNOSIS — K529 Noninfective gastroenteritis and colitis, unspecified: Secondary | ICD-10-CM | POA: Diagnosis not present

## 2017-04-28 DIAGNOSIS — Z7952 Long term (current) use of systemic steroids: Secondary | ICD-10-CM | POA: Diagnosis not present

## 2017-05-05 DIAGNOSIS — C539 Malignant neoplasm of cervix uteri, unspecified: Secondary | ICD-10-CM | POA: Diagnosis not present

## 2017-05-05 DIAGNOSIS — C530 Malignant neoplasm of endocervix: Secondary | ICD-10-CM | POA: Diagnosis not present

## 2017-05-06 ENCOUNTER — Telehealth: Payer: Self-pay | Admitting: Oncology

## 2017-05-06 NOTE — Telephone Encounter (Signed)
Faxed office notes to [facility] risk adjustment

## 2017-05-07 DIAGNOSIS — Z8551 Personal history of malignant neoplasm of bladder: Secondary | ICD-10-CM | POA: Diagnosis not present

## 2017-05-07 DIAGNOSIS — Z23 Encounter for immunization: Secondary | ICD-10-CM | POA: Diagnosis not present

## 2017-05-07 DIAGNOSIS — C539 Malignant neoplasm of cervix uteri, unspecified: Secondary | ICD-10-CM | POA: Diagnosis not present

## 2017-05-07 DIAGNOSIS — C7800 Secondary malignant neoplasm of unspecified lung: Secondary | ICD-10-CM | POA: Diagnosis not present

## 2017-05-07 DIAGNOSIS — R011 Cardiac murmur, unspecified: Secondary | ICD-10-CM | POA: Diagnosis not present

## 2017-05-07 DIAGNOSIS — I10 Essential (primary) hypertension: Secondary | ICD-10-CM | POA: Diagnosis not present

## 2017-05-07 DIAGNOSIS — G620 Drug-induced polyneuropathy: Secondary | ICD-10-CM | POA: Diagnosis not present

## 2017-05-08 DIAGNOSIS — Z5112 Encounter for antineoplastic immunotherapy: Secondary | ICD-10-CM | POA: Diagnosis not present

## 2017-05-08 DIAGNOSIS — Z79899 Other long term (current) drug therapy: Secondary | ICD-10-CM | POA: Diagnosis not present

## 2017-05-08 DIAGNOSIS — C539 Malignant neoplasm of cervix uteri, unspecified: Secondary | ICD-10-CM | POA: Diagnosis not present

## 2017-05-26 DIAGNOSIS — C538 Malignant neoplasm of overlapping sites of cervix uteri: Secondary | ICD-10-CM | POA: Diagnosis not present

## 2017-05-29 DIAGNOSIS — C539 Malignant neoplasm of cervix uteri, unspecified: Secondary | ICD-10-CM | POA: Diagnosis not present

## 2017-05-29 DIAGNOSIS — Z5112 Encounter for antineoplastic immunotherapy: Secondary | ICD-10-CM | POA: Diagnosis not present

## 2017-06-06 DIAGNOSIS — D649 Anemia, unspecified: Secondary | ICD-10-CM | POA: Diagnosis not present

## 2017-06-06 DIAGNOSIS — R011 Cardiac murmur, unspecified: Secondary | ICD-10-CM | POA: Diagnosis not present

## 2017-06-06 DIAGNOSIS — I10 Essential (primary) hypertension: Secondary | ICD-10-CM | POA: Diagnosis not present

## 2017-06-16 DIAGNOSIS — C539 Malignant neoplasm of cervix uteri, unspecified: Secondary | ICD-10-CM | POA: Diagnosis not present

## 2017-06-16 DIAGNOSIS — C530 Malignant neoplasm of endocervix: Secondary | ICD-10-CM | POA: Diagnosis not present

## 2017-06-19 DIAGNOSIS — C519 Malignant neoplasm of vulva, unspecified: Secondary | ICD-10-CM | POA: Diagnosis not present

## 2017-06-19 DIAGNOSIS — C7800 Secondary malignant neoplasm of unspecified lung: Secondary | ICD-10-CM | POA: Diagnosis not present

## 2017-06-19 DIAGNOSIS — Z923 Personal history of irradiation: Secondary | ICD-10-CM | POA: Diagnosis not present

## 2017-06-19 DIAGNOSIS — T451X5A Adverse effect of antineoplastic and immunosuppressive drugs, initial encounter: Secondary | ICD-10-CM | POA: Diagnosis not present

## 2017-06-19 DIAGNOSIS — Z5112 Encounter for antineoplastic immunotherapy: Secondary | ICD-10-CM | POA: Diagnosis not present

## 2017-06-19 DIAGNOSIS — G629 Polyneuropathy, unspecified: Secondary | ICD-10-CM | POA: Diagnosis not present

## 2017-06-19 DIAGNOSIS — R591 Generalized enlarged lymph nodes: Secondary | ICD-10-CM | POA: Diagnosis not present

## 2017-06-19 DIAGNOSIS — G620 Drug-induced polyneuropathy: Secondary | ICD-10-CM | POA: Diagnosis not present

## 2017-06-19 DIAGNOSIS — I10 Essential (primary) hypertension: Secondary | ICD-10-CM | POA: Diagnosis not present

## 2017-06-19 DIAGNOSIS — C539 Malignant neoplasm of cervix uteri, unspecified: Secondary | ICD-10-CM | POA: Diagnosis not present

## 2017-06-29 IMAGING — CT NM PET TUM IMG RESTAG (PS) SKULL BASE T - THIGH
7 series · 25 of 25 positions shown · non-contrast
Comparison: PET-CT scan 02/06/2016

CLINICAL DATA: Subsequent treatment strategy for cervical
carcinoma.

EXAM:
NUCLEAR MEDICINE PET SKULL BASE TO THIGH
TECHNIQUE: 8.2 mCi F-18 FDG was injected intravenously. Full-ring PET imaging
was performed from the skull base to thigh after the radiotracer. CT
data was obtained and used for attenuation correction and anatomic
localization.
FASTING BLOOD GLUCOSE:  Value: 113 mg/dl

[Series 3: pet sk_thigh ac · axial · 5.0mm · 4.07mm/px · z∈[-1326,-494]mm · 5 of 209 slices shown]
[im 1/209]
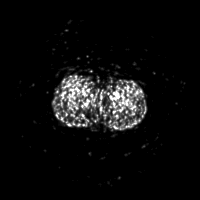
[im 53/209]
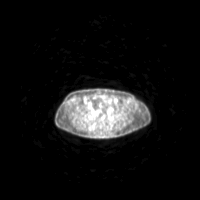
[im 105/209]
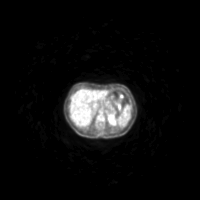
[im 157/209]
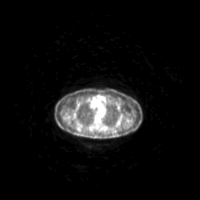
[im 209/209]
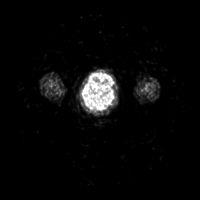

[Series 4: ct sk_thigh 5.0 b31f · axial · 5.0mm · 0.98mm/px · z∈[-1326,-494]mm · 5 of 209 slices shown]
[im 1/209]
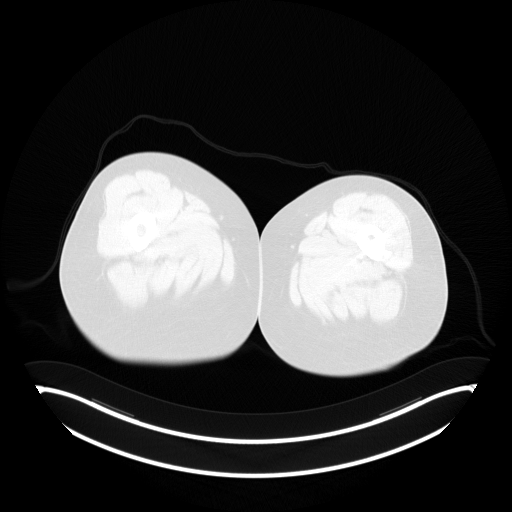
[im 53/209]
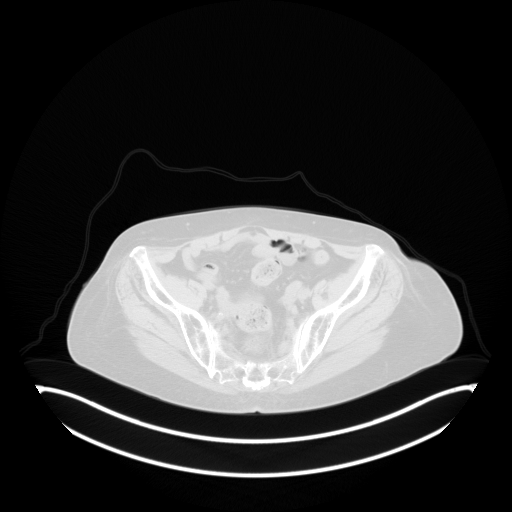
[im 105/209]
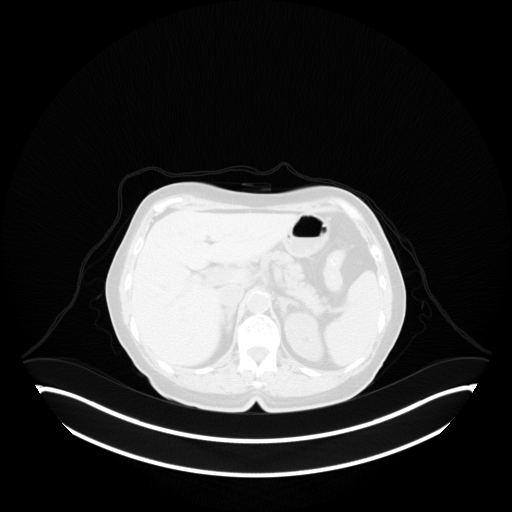
[im 157/209]
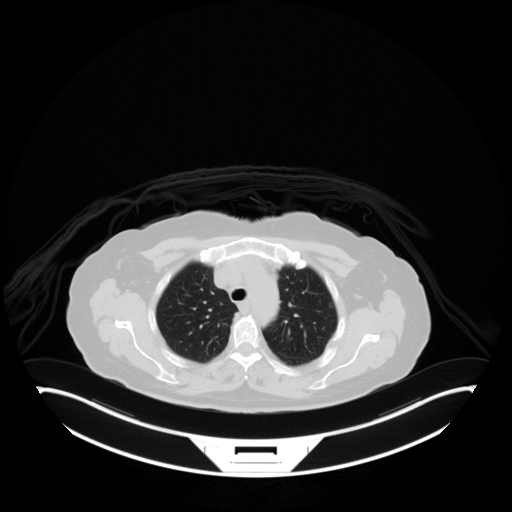
[im 209/209  brain]
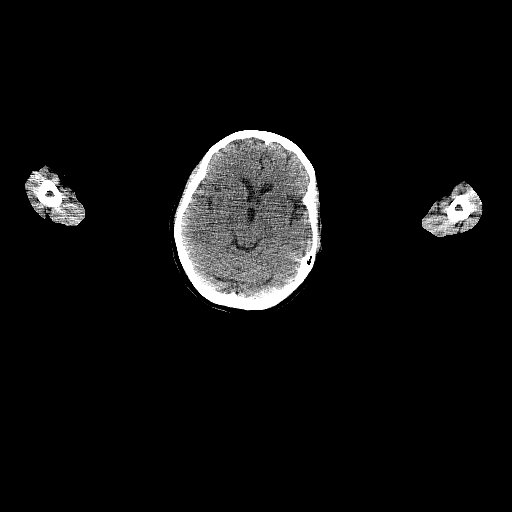

[Series 7: pet sk_thigh nac · axial · 5.0mm · 4.07mm/px · z∈[-1326,-494]mm · 5 of 209 slices shown]
[im 1/209]
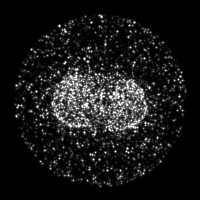
[im 53/209]
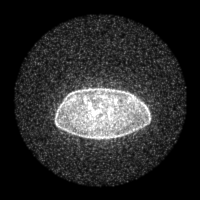
[im 105/209]
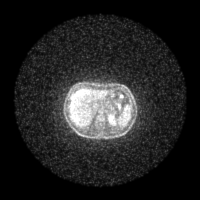
[im 157/209]
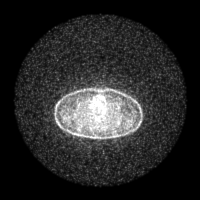
[im 209/209]
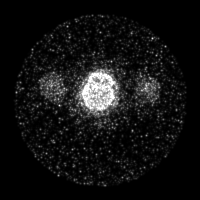

[Series 8: ct sk_thigh 5.0 b70f (id)_bone · axial · 5.0mm · 0.61mm/px · z∈[-878,-626]mm · 2 of 64 slices shown]
[im 1/64  bone]
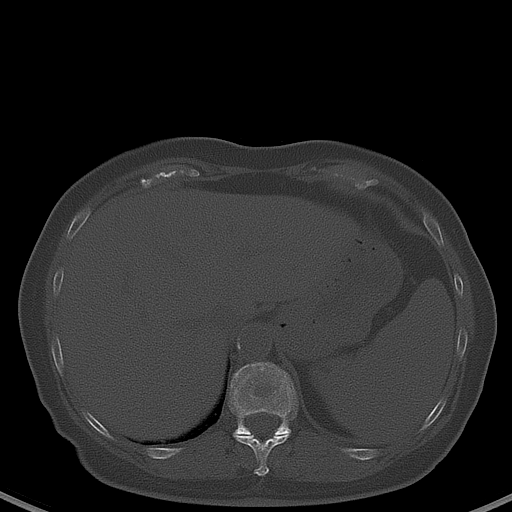
[im 64/64  bone]
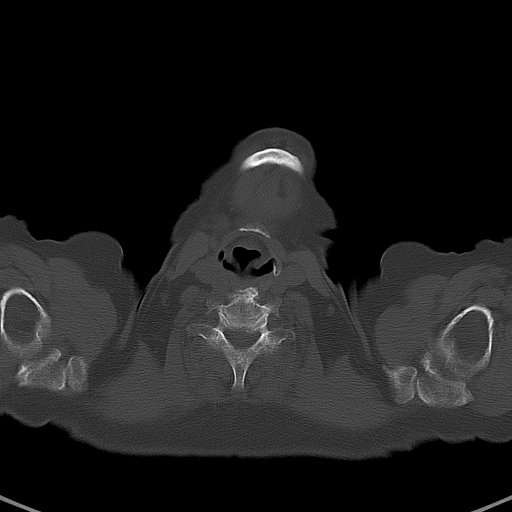

[Series 603: range-ct sk_thigh 5.0 (id)<alpha range> · 2 of 63 slices shown (1 of 2)]
[im 1/63]
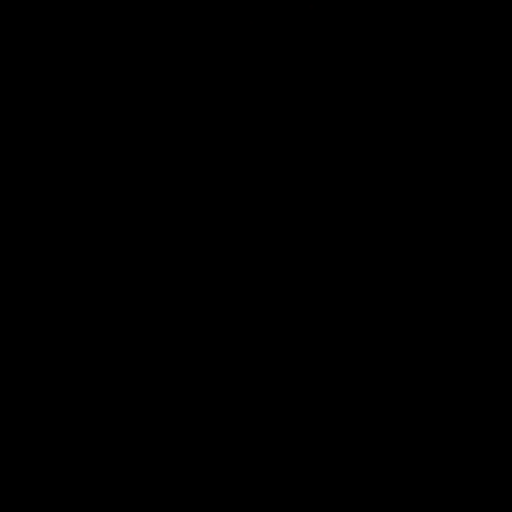
[im 63/63]
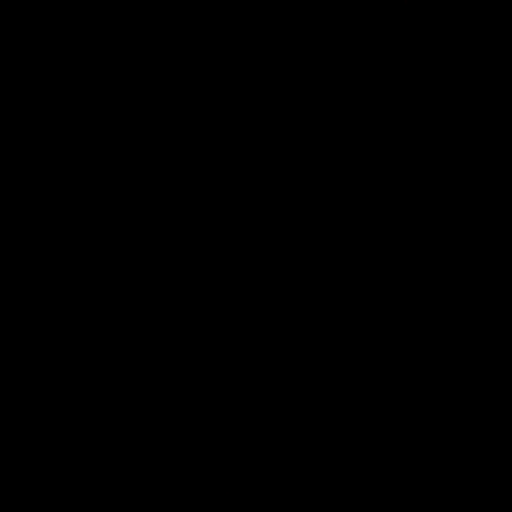

[Series 604: mip collection · coronal · 1.73mm/px · 1 of 32 slices shown]
[im 1/32]
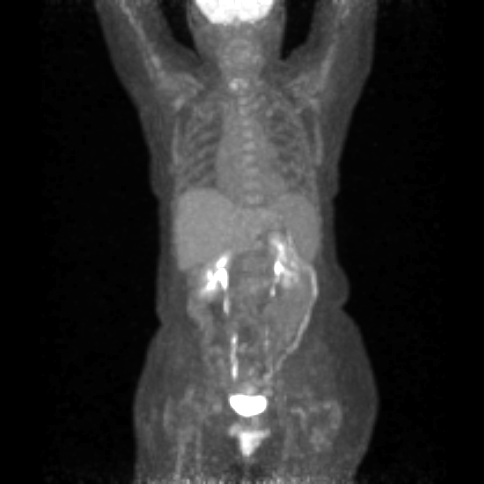

[Series 605: range-ct sk_thigh 5.0 (id)<alpha range> · 5 of 197 slices shown (2 of 2)]
[im 1/197]
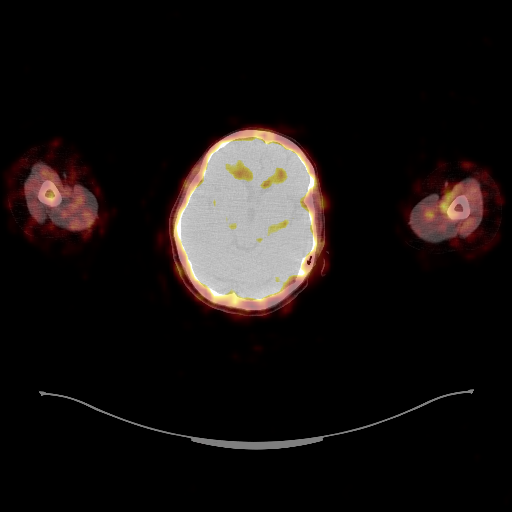
[im 50/197]
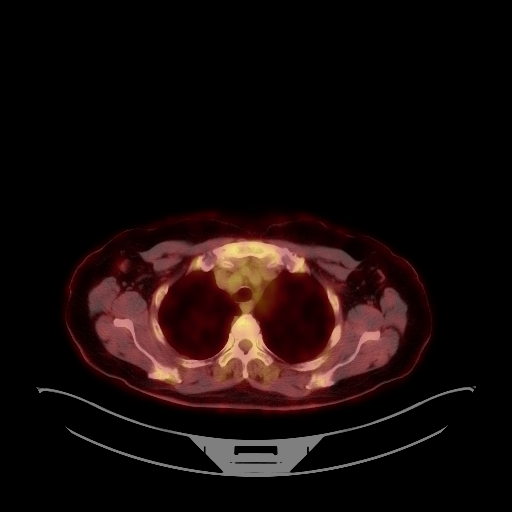
[im 99/197]
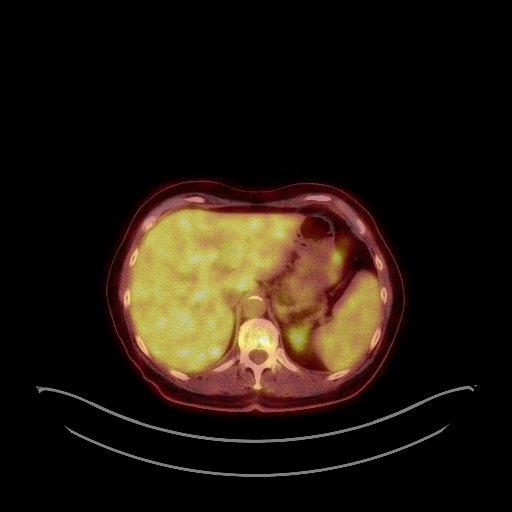
[im 148/197]
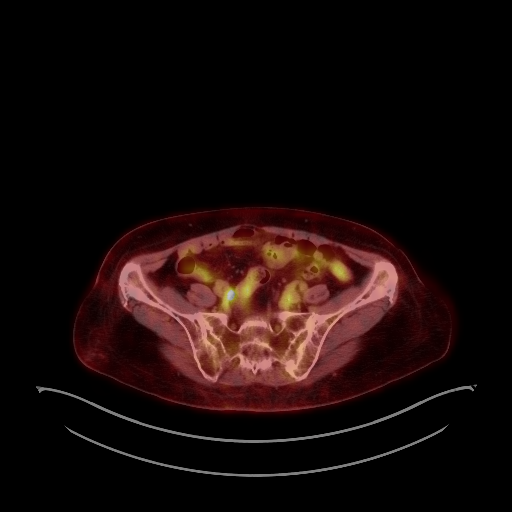
[im 197/197]
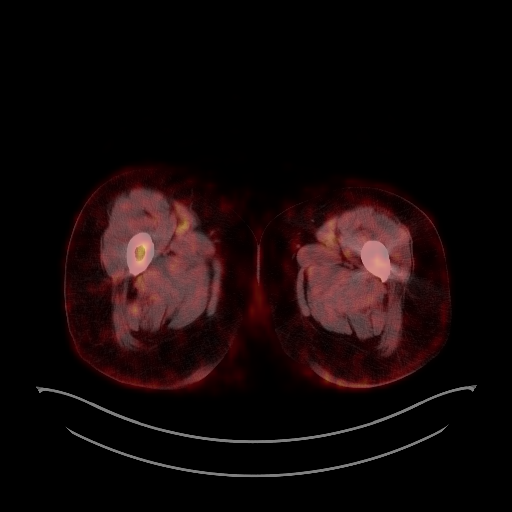

[25 of 25 positions shown; findings below may reference images not displayed]

FINDINGS: NECK

No hypermetabolic lymph nodes in the neck.

CHEST

No hypermetabolic mediastinal or hilar nodes. No suspicious
pulmonary nodules on the CT scan.

ABDOMEN/PELVIS

Marked decrease in size and metabolic activity of cervical mass.
Mass now measures 5.0 x 3.0 cm decreased from 6.4 x 5.4 cm. Lesion
is markedly reduced in metabolic activity with SUV max equal
decreased from 16.7.

There is intense metabolic activity associated with the external
genitalia on the LEFT and RIGHT. There is thickening of the
genitalia through this region. These findings are similar to
comparison exam with SUV max equal 14.7 (image 198, series 605).

There is mild linear activity associated with the RIGHT inferior
pubic ramus at this level (image 27 of the fused data se)t. No clear
hypermetabolic pelvic lymph nodes.

No abnormal metabolic activity in the liver.

There is photopenia within the lower lumbar spine consists radiation
change.

SKELETON

No focal hypermetabolic activity to suggest skeletal metastasis.
IMPRESSION: 1. Decrease in size and metabolic activity of cervical mass.
Persistent hypermetabolic activity does remain.
2. Unusual intense hypermetabolic activity associated with external
genitalia. Differential would include unusual pattern of neoplasm
versus inflammation versus urine contamination. Favor unusual
pattern of cervical carcinoma versus inflammation.
3. Linear uptake within the RIGHT inferior pubic ramus may relate to
radiation change. Cannot exclude bone involvement by carcinoma.
4. No evidence of active carcinoma in pelvic lymph nodes.
5. No evidence of distant metastasis.

## 2017-07-07 DIAGNOSIS — C530 Malignant neoplasm of endocervix: Secondary | ICD-10-CM | POA: Diagnosis not present

## 2017-07-07 DIAGNOSIS — C539 Malignant neoplasm of cervix uteri, unspecified: Secondary | ICD-10-CM | POA: Diagnosis not present

## 2017-07-10 DIAGNOSIS — Z5112 Encounter for antineoplastic immunotherapy: Secondary | ICD-10-CM | POA: Diagnosis not present

## 2017-07-10 DIAGNOSIS — C539 Malignant neoplasm of cervix uteri, unspecified: Secondary | ICD-10-CM | POA: Diagnosis not present

## 2017-07-28 DIAGNOSIS — Z5111 Encounter for antineoplastic chemotherapy: Secondary | ICD-10-CM | POA: Diagnosis not present

## 2017-07-28 DIAGNOSIS — C539 Malignant neoplasm of cervix uteri, unspecified: Secondary | ICD-10-CM | POA: Diagnosis not present

## 2017-07-31 DIAGNOSIS — Z5112 Encounter for antineoplastic immunotherapy: Secondary | ICD-10-CM | POA: Diagnosis not present

## 2017-07-31 DIAGNOSIS — C539 Malignant neoplasm of cervix uteri, unspecified: Secondary | ICD-10-CM | POA: Diagnosis not present

## 2017-08-18 DIAGNOSIS — C539 Malignant neoplasm of cervix uteri, unspecified: Secondary | ICD-10-CM | POA: Diagnosis not present

## 2017-08-18 DIAGNOSIS — Z5111 Encounter for antineoplastic chemotherapy: Secondary | ICD-10-CM | POA: Diagnosis not present

## 2017-08-19 DIAGNOSIS — R936 Abnormal findings on diagnostic imaging of limbs: Secondary | ICD-10-CM | POA: Diagnosis not present

## 2017-08-19 DIAGNOSIS — C530 Malignant neoplasm of endocervix: Secondary | ICD-10-CM | POA: Diagnosis not present

## 2017-08-19 DIAGNOSIS — C539 Malignant neoplasm of cervix uteri, unspecified: Secondary | ICD-10-CM | POA: Diagnosis not present

## 2017-08-19 DIAGNOSIS — J929 Pleural plaque without asbestos: Secondary | ICD-10-CM | POA: Diagnosis not present

## 2017-08-19 DIAGNOSIS — R918 Other nonspecific abnormal finding of lung field: Secondary | ICD-10-CM | POA: Diagnosis not present

## 2017-08-19 DIAGNOSIS — R590 Localized enlarged lymph nodes: Secondary | ICD-10-CM | POA: Diagnosis not present

## 2017-08-21 DIAGNOSIS — I10 Essential (primary) hypertension: Secondary | ICD-10-CM | POA: Diagnosis not present

## 2017-08-21 DIAGNOSIS — C538 Malignant neoplasm of overlapping sites of cervix uteri: Secondary | ICD-10-CM | POA: Diagnosis not present

## 2017-08-21 DIAGNOSIS — N898 Other specified noninflammatory disorders of vagina: Secondary | ICD-10-CM | POA: Diagnosis not present

## 2017-08-21 DIAGNOSIS — C7800 Secondary malignant neoplasm of unspecified lung: Secondary | ICD-10-CM | POA: Diagnosis not present

## 2017-08-21 DIAGNOSIS — R591 Generalized enlarged lymph nodes: Secondary | ICD-10-CM | POA: Diagnosis not present

## 2017-08-21 DIAGNOSIS — J929 Pleural plaque without asbestos: Secondary | ICD-10-CM | POA: Diagnosis not present

## 2017-08-21 DIAGNOSIS — Z5112 Encounter for antineoplastic immunotherapy: Secondary | ICD-10-CM | POA: Diagnosis not present

## 2017-08-21 DIAGNOSIS — R918 Other nonspecific abnormal finding of lung field: Secondary | ICD-10-CM | POA: Diagnosis not present

## 2017-08-21 DIAGNOSIS — Z96642 Presence of left artificial hip joint: Secondary | ICD-10-CM | POA: Diagnosis not present

## 2017-09-08 DIAGNOSIS — C539 Malignant neoplasm of cervix uteri, unspecified: Secondary | ICD-10-CM | POA: Diagnosis not present

## 2017-09-08 DIAGNOSIS — Z5111 Encounter for antineoplastic chemotherapy: Secondary | ICD-10-CM | POA: Diagnosis not present

## 2017-09-11 DIAGNOSIS — Z79899 Other long term (current) drug therapy: Secondary | ICD-10-CM | POA: Diagnosis not present

## 2017-09-11 DIAGNOSIS — Z5112 Encounter for antineoplastic immunotherapy: Secondary | ICD-10-CM | POA: Diagnosis not present

## 2017-09-11 DIAGNOSIS — C539 Malignant neoplasm of cervix uteri, unspecified: Secondary | ICD-10-CM | POA: Diagnosis not present

## 2017-09-29 DIAGNOSIS — Z5111 Encounter for antineoplastic chemotherapy: Secondary | ICD-10-CM | POA: Diagnosis not present

## 2017-09-29 DIAGNOSIS — C539 Malignant neoplasm of cervix uteri, unspecified: Secondary | ICD-10-CM | POA: Diagnosis not present

## 2017-10-06 DIAGNOSIS — Z5112 Encounter for antineoplastic immunotherapy: Secondary | ICD-10-CM | POA: Diagnosis not present

## 2017-10-06 DIAGNOSIS — C530 Malignant neoplasm of endocervix: Secondary | ICD-10-CM | POA: Diagnosis not present

## 2017-10-27 DIAGNOSIS — Z5111 Encounter for antineoplastic chemotherapy: Secondary | ICD-10-CM | POA: Diagnosis not present

## 2017-10-27 DIAGNOSIS — C539 Malignant neoplasm of cervix uteri, unspecified: Secondary | ICD-10-CM | POA: Diagnosis not present

## 2017-10-30 DIAGNOSIS — Z5112 Encounter for antineoplastic immunotherapy: Secondary | ICD-10-CM | POA: Diagnosis not present

## 2017-10-30 DIAGNOSIS — C577 Malignant neoplasm of other specified female genital organs: Secondary | ICD-10-CM | POA: Diagnosis not present

## 2017-10-30 DIAGNOSIS — R918 Other nonspecific abnormal finding of lung field: Secondary | ICD-10-CM | POA: Diagnosis not present

## 2017-10-30 DIAGNOSIS — C539 Malignant neoplasm of cervix uteri, unspecified: Secondary | ICD-10-CM | POA: Diagnosis not present

## 2017-11-17 DIAGNOSIS — Z5111 Encounter for antineoplastic chemotherapy: Secondary | ICD-10-CM | POA: Diagnosis not present

## 2017-11-17 DIAGNOSIS — C539 Malignant neoplasm of cervix uteri, unspecified: Secondary | ICD-10-CM | POA: Diagnosis not present

## 2017-11-20 DIAGNOSIS — Z5112 Encounter for antineoplastic immunotherapy: Secondary | ICD-10-CM | POA: Diagnosis not present

## 2017-11-20 DIAGNOSIS — C539 Malignant neoplasm of cervix uteri, unspecified: Secondary | ICD-10-CM | POA: Diagnosis not present

## 2017-12-05 DIAGNOSIS — Z0000 Encounter for general adult medical examination without abnormal findings: Secondary | ICD-10-CM | POA: Diagnosis not present

## 2017-12-05 DIAGNOSIS — Z8551 Personal history of malignant neoplasm of bladder: Secondary | ICD-10-CM | POA: Diagnosis not present

## 2017-12-05 DIAGNOSIS — I10 Essential (primary) hypertension: Secondary | ICD-10-CM | POA: Diagnosis not present

## 2017-12-05 DIAGNOSIS — R011 Cardiac murmur, unspecified: Secondary | ICD-10-CM | POA: Diagnosis not present

## 2017-12-05 DIAGNOSIS — G620 Drug-induced polyneuropathy: Secondary | ICD-10-CM | POA: Diagnosis not present

## 2017-12-05 DIAGNOSIS — C7800 Secondary malignant neoplasm of unspecified lung: Secondary | ICD-10-CM | POA: Diagnosis not present

## 2017-12-05 DIAGNOSIS — C539 Malignant neoplasm of cervix uteri, unspecified: Secondary | ICD-10-CM | POA: Diagnosis not present

## 2017-12-08 DIAGNOSIS — C539 Malignant neoplasm of cervix uteri, unspecified: Secondary | ICD-10-CM | POA: Diagnosis not present

## 2017-12-08 DIAGNOSIS — Z5111 Encounter for antineoplastic chemotherapy: Secondary | ICD-10-CM | POA: Diagnosis not present

## 2017-12-15 DIAGNOSIS — C539 Malignant neoplasm of cervix uteri, unspecified: Secondary | ICD-10-CM | POA: Diagnosis not present

## 2017-12-15 DIAGNOSIS — D8989 Other specified disorders involving the immune mechanism, not elsewhere classified: Secondary | ICD-10-CM | POA: Diagnosis not present

## 2017-12-15 DIAGNOSIS — Z5111 Encounter for antineoplastic chemotherapy: Secondary | ICD-10-CM | POA: Diagnosis not present

## 2018-01-12 DIAGNOSIS — Z5111 Encounter for antineoplastic chemotherapy: Secondary | ICD-10-CM | POA: Diagnosis not present

## 2018-01-12 DIAGNOSIS — C539 Malignant neoplasm of cervix uteri, unspecified: Secondary | ICD-10-CM | POA: Diagnosis not present

## 2018-01-13 DIAGNOSIS — C531 Malignant neoplasm of exocervix: Secondary | ICD-10-CM | POA: Diagnosis not present

## 2018-01-15 DIAGNOSIS — C539 Malignant neoplasm of cervix uteri, unspecified: Secondary | ICD-10-CM | POA: Diagnosis not present

## 2018-01-15 DIAGNOSIS — C519 Malignant neoplasm of vulva, unspecified: Secondary | ICD-10-CM | POA: Diagnosis not present

## 2018-02-16 DIAGNOSIS — Z5111 Encounter for antineoplastic chemotherapy: Secondary | ICD-10-CM | POA: Diagnosis not present

## 2018-02-16 DIAGNOSIS — C539 Malignant neoplasm of cervix uteri, unspecified: Secondary | ICD-10-CM | POA: Diagnosis not present

## 2018-02-19 DIAGNOSIS — G629 Polyneuropathy, unspecified: Secondary | ICD-10-CM | POA: Diagnosis not present

## 2018-02-19 DIAGNOSIS — C539 Malignant neoplasm of cervix uteri, unspecified: Secondary | ICD-10-CM | POA: Diagnosis not present

## 2018-02-19 DIAGNOSIS — Z79899 Other long term (current) drug therapy: Secondary | ICD-10-CM | POA: Diagnosis not present

## 2018-03-13 DIAGNOSIS — C539 Malignant neoplasm of cervix uteri, unspecified: Secondary | ICD-10-CM | POA: Diagnosis not present

## 2018-03-19 DIAGNOSIS — Z5111 Encounter for antineoplastic chemotherapy: Secondary | ICD-10-CM | POA: Diagnosis not present

## 2018-03-19 DIAGNOSIS — C519 Malignant neoplasm of vulva, unspecified: Secondary | ICD-10-CM | POA: Diagnosis not present

## 2018-03-19 DIAGNOSIS — C530 Malignant neoplasm of endocervix: Secondary | ICD-10-CM | POA: Diagnosis not present

## 2018-03-19 DIAGNOSIS — D8989 Other specified disorders involving the immune mechanism, not elsewhere classified: Secondary | ICD-10-CM | POA: Diagnosis not present

## 2018-04-06 DIAGNOSIS — C539 Malignant neoplasm of cervix uteri, unspecified: Secondary | ICD-10-CM | POA: Diagnosis not present

## 2018-04-06 DIAGNOSIS — Z5111 Encounter for antineoplastic chemotherapy: Secondary | ICD-10-CM | POA: Diagnosis not present

## 2018-04-09 DIAGNOSIS — Z5111 Encounter for antineoplastic chemotherapy: Secondary | ICD-10-CM | POA: Diagnosis not present

## 2018-04-09 DIAGNOSIS — D8989 Other specified disorders involving the immune mechanism, not elsewhere classified: Secondary | ICD-10-CM | POA: Diagnosis not present

## 2018-04-09 DIAGNOSIS — C539 Malignant neoplasm of cervix uteri, unspecified: Secondary | ICD-10-CM | POA: Diagnosis not present

## 2018-04-27 DIAGNOSIS — C7889 Secondary malignant neoplasm of other digestive organs: Secondary | ICD-10-CM | POA: Diagnosis not present

## 2018-04-27 DIAGNOSIS — C7800 Secondary malignant neoplasm of unspecified lung: Secondary | ICD-10-CM | POA: Diagnosis not present

## 2018-04-27 DIAGNOSIS — C530 Malignant neoplasm of endocervix: Secondary | ICD-10-CM | POA: Diagnosis not present

## 2018-04-27 DIAGNOSIS — R590 Localized enlarged lymph nodes: Secondary | ICD-10-CM | POA: Diagnosis not present

## 2018-04-27 DIAGNOSIS — R6889 Other general symptoms and signs: Secondary | ICD-10-CM | POA: Diagnosis not present

## 2018-04-27 DIAGNOSIS — D8989 Other specified disorders involving the immune mechanism, not elsewhere classified: Secondary | ICD-10-CM | POA: Diagnosis not present

## 2018-04-27 DIAGNOSIS — C782 Secondary malignant neoplasm of pleura: Secondary | ICD-10-CM | POA: Diagnosis not present

## 2018-04-27 DIAGNOSIS — C786 Secondary malignant neoplasm of retroperitoneum and peritoneum: Secondary | ICD-10-CM | POA: Diagnosis not present

## 2018-04-30 DIAGNOSIS — C539 Malignant neoplasm of cervix uteri, unspecified: Secondary | ICD-10-CM | POA: Diagnosis not present

## 2018-05-11 DIAGNOSIS — C539 Malignant neoplasm of cervix uteri, unspecified: Secondary | ICD-10-CM | POA: Diagnosis not present

## 2018-05-14 DIAGNOSIS — C530 Malignant neoplasm of endocervix: Secondary | ICD-10-CM | POA: Diagnosis not present

## 2018-05-14 DIAGNOSIS — C519 Malignant neoplasm of vulva, unspecified: Secondary | ICD-10-CM | POA: Diagnosis not present

## 2018-05-14 DIAGNOSIS — Z5111 Encounter for antineoplastic chemotherapy: Secondary | ICD-10-CM | POA: Diagnosis not present

## 2018-05-14 DIAGNOSIS — D899 Disorder involving the immune mechanism, unspecified: Secondary | ICD-10-CM | POA: Diagnosis not present

## 2018-05-22 DIAGNOSIS — Z5111 Encounter for antineoplastic chemotherapy: Secondary | ICD-10-CM | POA: Diagnosis not present

## 2018-05-22 DIAGNOSIS — C539 Malignant neoplasm of cervix uteri, unspecified: Secondary | ICD-10-CM | POA: Diagnosis not present

## 2018-06-01 DIAGNOSIS — D8989 Other specified disorders involving the immune mechanism, not elsewhere classified: Secondary | ICD-10-CM | POA: Diagnosis not present

## 2018-06-01 DIAGNOSIS — Z5111 Encounter for antineoplastic chemotherapy: Secondary | ICD-10-CM | POA: Diagnosis not present

## 2018-06-01 DIAGNOSIS — C519 Malignant neoplasm of vulva, unspecified: Secondary | ICD-10-CM | POA: Diagnosis not present

## 2018-06-01 DIAGNOSIS — C530 Malignant neoplasm of endocervix: Secondary | ICD-10-CM | POA: Diagnosis not present

## 2018-06-05 DIAGNOSIS — I10 Essential (primary) hypertension: Secondary | ICD-10-CM | POA: Diagnosis not present

## 2018-06-05 DIAGNOSIS — R011 Cardiac murmur, unspecified: Secondary | ICD-10-CM | POA: Diagnosis not present

## 2018-06-12 DIAGNOSIS — Z5111 Encounter for antineoplastic chemotherapy: Secondary | ICD-10-CM | POA: Diagnosis not present

## 2018-06-12 DIAGNOSIS — C539 Malignant neoplasm of cervix uteri, unspecified: Secondary | ICD-10-CM | POA: Diagnosis not present

## 2018-06-18 DIAGNOSIS — C519 Malignant neoplasm of vulva, unspecified: Secondary | ICD-10-CM | POA: Diagnosis not present

## 2018-06-18 DIAGNOSIS — C530 Malignant neoplasm of endocervix: Secondary | ICD-10-CM | POA: Diagnosis not present

## 2018-06-18 DIAGNOSIS — Z5111 Encounter for antineoplastic chemotherapy: Secondary | ICD-10-CM | POA: Diagnosis not present

## 2018-06-18 DIAGNOSIS — G6289 Other specified polyneuropathies: Secondary | ICD-10-CM | POA: Diagnosis not present

## 2018-06-18 DIAGNOSIS — D8989 Other specified disorders involving the immune mechanism, not elsewhere classified: Secondary | ICD-10-CM | POA: Diagnosis not present

## 2018-06-26 DIAGNOSIS — C539 Malignant neoplasm of cervix uteri, unspecified: Secondary | ICD-10-CM | POA: Diagnosis not present

## 2018-06-26 DIAGNOSIS — Z5111 Encounter for antineoplastic chemotherapy: Secondary | ICD-10-CM | POA: Diagnosis not present

## 2018-07-02 DIAGNOSIS — C519 Malignant neoplasm of vulva, unspecified: Secondary | ICD-10-CM | POA: Diagnosis not present

## 2018-07-02 DIAGNOSIS — Z5111 Encounter for antineoplastic chemotherapy: Secondary | ICD-10-CM | POA: Diagnosis not present

## 2018-07-02 DIAGNOSIS — C530 Malignant neoplasm of endocervix: Secondary | ICD-10-CM | POA: Diagnosis not present

## 2018-07-02 DIAGNOSIS — D8989 Other specified disorders involving the immune mechanism, not elsewhere classified: Secondary | ICD-10-CM | POA: Diagnosis not present

## 2018-07-06 DIAGNOSIS — E871 Hypo-osmolality and hyponatremia: Secondary | ICD-10-CM | POA: Diagnosis not present

## 2018-07-06 DIAGNOSIS — D649 Anemia, unspecified: Secondary | ICD-10-CM | POA: Diagnosis not present

## 2018-07-06 DIAGNOSIS — I10 Essential (primary) hypertension: Secondary | ICD-10-CM | POA: Diagnosis not present

## 2018-07-06 DIAGNOSIS — R110 Nausea: Secondary | ICD-10-CM | POA: Diagnosis not present

## 2018-07-06 DIAGNOSIS — Z923 Personal history of irradiation: Secondary | ICD-10-CM | POA: Diagnosis not present

## 2018-07-06 DIAGNOSIS — E878 Other disorders of electrolyte and fluid balance, not elsewhere classified: Secondary | ICD-10-CM | POA: Diagnosis not present

## 2018-07-06 DIAGNOSIS — C539 Malignant neoplasm of cervix uteri, unspecified: Secondary | ICD-10-CM | POA: Diagnosis not present

## 2018-07-06 DIAGNOSIS — Z87891 Personal history of nicotine dependence: Secondary | ICD-10-CM | POA: Diagnosis not present

## 2018-07-16 DIAGNOSIS — C782 Secondary malignant neoplasm of pleura: Secondary | ICD-10-CM | POA: Diagnosis not present

## 2018-07-16 DIAGNOSIS — C7800 Secondary malignant neoplasm of unspecified lung: Secondary | ICD-10-CM | POA: Diagnosis not present

## 2018-07-16 DIAGNOSIS — I10 Essential (primary) hypertension: Secondary | ICD-10-CM | POA: Diagnosis not present

## 2018-07-16 DIAGNOSIS — D630 Anemia in neoplastic disease: Secondary | ICD-10-CM | POA: Diagnosis not present

## 2018-07-16 DIAGNOSIS — G629 Polyneuropathy, unspecified: Secondary | ICD-10-CM | POA: Diagnosis not present

## 2018-07-16 DIAGNOSIS — R918 Other nonspecific abnormal finding of lung field: Secondary | ICD-10-CM | POA: Diagnosis not present

## 2018-07-16 DIAGNOSIS — R9431 Abnormal electrocardiogram [ECG] [EKG]: Secondary | ICD-10-CM | POA: Diagnosis not present

## 2018-07-16 DIAGNOSIS — C7801 Secondary malignant neoplasm of right lung: Secondary | ICD-10-CM | POA: Diagnosis not present

## 2018-07-16 DIAGNOSIS — R7989 Other specified abnormal findings of blood chemistry: Secondary | ICD-10-CM | POA: Diagnosis not present

## 2018-07-16 DIAGNOSIS — D649 Anemia, unspecified: Secondary | ICD-10-CM | POA: Diagnosis not present

## 2018-07-16 DIAGNOSIS — C7802 Secondary malignant neoplasm of left lung: Secondary | ICD-10-CM | POA: Diagnosis not present

## 2018-07-16 DIAGNOSIS — C539 Malignant neoplasm of cervix uteri, unspecified: Secondary | ICD-10-CM | POA: Diagnosis not present

## 2018-07-16 DIAGNOSIS — R0902 Hypoxemia: Secondary | ICD-10-CM | POA: Diagnosis not present

## 2018-07-16 DIAGNOSIS — R0600 Dyspnea, unspecified: Secondary | ICD-10-CM | POA: Diagnosis not present

## 2018-07-16 DIAGNOSIS — J929 Pleural plaque without asbestos: Secondary | ICD-10-CM | POA: Diagnosis not present

## 2018-07-16 DIAGNOSIS — R748 Abnormal levels of other serum enzymes: Secondary | ICD-10-CM | POA: Diagnosis not present

## 2018-07-16 DIAGNOSIS — R590 Localized enlarged lymph nodes: Secondary | ICD-10-CM | POA: Diagnosis not present

## 2018-07-16 DIAGNOSIS — R599 Enlarged lymph nodes, unspecified: Secondary | ICD-10-CM | POA: Diagnosis not present

## 2018-07-21 DIAGNOSIS — C3491 Malignant neoplasm of unspecified part of right bronchus or lung: Secondary | ICD-10-CM | POA: Diagnosis not present

## 2018-07-21 DIAGNOSIS — D630 Anemia in neoplastic disease: Secondary | ICD-10-CM | POA: Diagnosis not present

## 2018-07-21 DIAGNOSIS — G620 Drug-induced polyneuropathy: Secondary | ICD-10-CM | POA: Diagnosis not present

## 2018-07-21 DIAGNOSIS — Z9981 Dependence on supplemental oxygen: Secondary | ICD-10-CM | POA: Diagnosis not present

## 2018-07-21 DIAGNOSIS — C3492 Malignant neoplasm of unspecified part of left bronchus or lung: Secondary | ICD-10-CM | POA: Diagnosis not present

## 2018-07-21 DIAGNOSIS — T451X5D Adverse effect of antineoplastic and immunosuppressive drugs, subsequent encounter: Secondary | ICD-10-CM | POA: Diagnosis not present

## 2018-07-21 DIAGNOSIS — C7982 Secondary malignant neoplasm of genital organs: Secondary | ICD-10-CM | POA: Diagnosis not present

## 2018-07-21 DIAGNOSIS — I10 Essential (primary) hypertension: Secondary | ICD-10-CM | POA: Diagnosis not present

## 2018-07-21 DIAGNOSIS — Z96642 Presence of left artificial hip joint: Secondary | ICD-10-CM | POA: Diagnosis not present

## 2018-07-22 DIAGNOSIS — G620 Drug-induced polyneuropathy: Secondary | ICD-10-CM | POA: Diagnosis not present

## 2018-07-22 DIAGNOSIS — C3492 Malignant neoplasm of unspecified part of left bronchus or lung: Secondary | ICD-10-CM | POA: Diagnosis not present

## 2018-07-22 DIAGNOSIS — Z96642 Presence of left artificial hip joint: Secondary | ICD-10-CM | POA: Diagnosis not present

## 2018-07-22 DIAGNOSIS — Z9981 Dependence on supplemental oxygen: Secondary | ICD-10-CM | POA: Diagnosis not present

## 2018-07-22 DIAGNOSIS — T451X5D Adverse effect of antineoplastic and immunosuppressive drugs, subsequent encounter: Secondary | ICD-10-CM | POA: Diagnosis not present

## 2018-07-22 DIAGNOSIS — I10 Essential (primary) hypertension: Secondary | ICD-10-CM | POA: Diagnosis not present

## 2018-07-22 DIAGNOSIS — D630 Anemia in neoplastic disease: Secondary | ICD-10-CM | POA: Diagnosis not present

## 2018-07-22 DIAGNOSIS — C3491 Malignant neoplasm of unspecified part of right bronchus or lung: Secondary | ICD-10-CM | POA: Diagnosis not present

## 2018-07-22 DIAGNOSIS — C7982 Secondary malignant neoplasm of genital organs: Secondary | ICD-10-CM | POA: Diagnosis not present

## 2018-07-23 DIAGNOSIS — C539 Malignant neoplasm of cervix uteri, unspecified: Secondary | ICD-10-CM | POA: Diagnosis not present

## 2018-08-30 DEATH — deceased
